# Patient Record
Sex: Female | Born: 1970 | Race: White | Hispanic: No | Marital: Single | Smoking: Never smoker
Health system: Southern US, Community
[De-identification: ages and names within clinical notes are randomized; demographics above are authoritative.]

## PROBLEM LIST (undated history)

## (undated) DIAGNOSIS — E079 Disorder of thyroid, unspecified: Secondary | ICD-10-CM

## (undated) DIAGNOSIS — E66813 Obesity, class 3: Secondary | ICD-10-CM

## (undated) DIAGNOSIS — F419 Anxiety disorder, unspecified: Secondary | ICD-10-CM

## (undated) DIAGNOSIS — T7840XA Allergy, unspecified, initial encounter: Secondary | ICD-10-CM

## (undated) DIAGNOSIS — I10 Essential (primary) hypertension: Secondary | ICD-10-CM

## (undated) DIAGNOSIS — E282 Polycystic ovarian syndrome: Secondary | ICD-10-CM

## (undated) DIAGNOSIS — K760 Fatty (change of) liver, not elsewhere classified: Secondary | ICD-10-CM

## (undated) DIAGNOSIS — F32A Depression, unspecified: Secondary | ICD-10-CM

## (undated) DIAGNOSIS — K219 Gastro-esophageal reflux disease without esophagitis: Secondary | ICD-10-CM

## (undated) DIAGNOSIS — E039 Hypothyroidism, unspecified: Secondary | ICD-10-CM

## (undated) HISTORY — DX: Polycystic ovarian syndrome: E28.2

## (undated) HISTORY — DX: Essential (primary) hypertension: I10

## (undated) HISTORY — DX: Disorder of thyroid, unspecified: E07.9

## (undated) HISTORY — DX: Allergy, unspecified, initial encounter: T78.40XA

## (undated) HISTORY — PX: DILATION AND CURETTAGE OF UTERUS: SHX78

---

## 1993-12-12 HISTORY — PX: OTHER SURGICAL HISTORY: SHX169

## 1994-12-12 HISTORY — PX: OTHER SURGICAL HISTORY: SHX169

## 2001-12-12 HISTORY — PX: CHOLECYSTECTOMY: SHX55

## 2005-09-16 ENCOUNTER — Ambulatory Visit: Payer: Self-pay | Admitting: Obstetrics and Gynecology

## 2006-06-25 ENCOUNTER — Emergency Department: Payer: Self-pay | Admitting: Emergency Medicine

## 2007-12-13 HISTORY — PX: TOTAL ABDOMINAL HYSTERECTOMY: SHX209

## 2010-11-15 ENCOUNTER — Emergency Department: Payer: Self-pay | Admitting: Emergency Medicine

## 2012-05-16 ENCOUNTER — Emergency Department: Payer: Self-pay | Admitting: Emergency Medicine

## 2015-07-08 DIAGNOSIS — K219 Gastro-esophageal reflux disease without esophagitis: Secondary | ICD-10-CM | POA: Insufficient documentation

## 2015-07-08 DIAGNOSIS — E282 Polycystic ovarian syndrome: Secondary | ICD-10-CM | POA: Insufficient documentation

## 2016-04-19 DIAGNOSIS — E669 Obesity, unspecified: Secondary | ICD-10-CM | POA: Insufficient documentation

## 2016-04-19 DIAGNOSIS — Z6841 Body Mass Index (BMI) 40.0 and over, adult: Secondary | ICD-10-CM

## 2017-05-26 ENCOUNTER — Encounter: Payer: Self-pay | Admitting: Internal Medicine

## 2017-05-31 ENCOUNTER — Ambulatory Visit (INDEPENDENT_AMBULATORY_CARE_PROVIDER_SITE_OTHER): Payer: BLUE CROSS/BLUE SHIELD | Admitting: Internal Medicine

## 2017-05-31 ENCOUNTER — Encounter: Payer: Self-pay | Admitting: Internal Medicine

## 2017-05-31 VITALS — BP 148/76 | HR 100 | Ht 63.0 in | Wt 248.0 lb

## 2017-05-31 DIAGNOSIS — E034 Atrophy of thyroid (acquired): Secondary | ICD-10-CM

## 2017-05-31 DIAGNOSIS — E282 Polycystic ovarian syndrome: Secondary | ICD-10-CM | POA: Diagnosis not present

## 2017-05-31 DIAGNOSIS — I10 Essential (primary) hypertension: Secondary | ICD-10-CM | POA: Diagnosis not present

## 2017-05-31 DIAGNOSIS — K219 Gastro-esophageal reflux disease without esophagitis: Secondary | ICD-10-CM

## 2017-05-31 DIAGNOSIS — R21 Rash and other nonspecific skin eruption: Secondary | ICD-10-CM

## 2017-05-31 DIAGNOSIS — R102 Pelvic and perineal pain: Secondary | ICD-10-CM | POA: Diagnosis not present

## 2017-05-31 DIAGNOSIS — Z1231 Encounter for screening mammogram for malignant neoplasm of breast: Secondary | ICD-10-CM

## 2017-05-31 LAB — POCT URINALYSIS DIPSTICK
Bilirubin, UA: NEGATIVE
Glucose, UA: NEGATIVE
Ketones, UA: NEGATIVE
Leukocytes, UA: NEGATIVE
NITRITE UA: NEGATIVE
PH UA: 6.5 (ref 5.0–8.0)
PROTEIN UA: NEGATIVE
RBC UA: NEGATIVE
Spec Grav, UA: <=1.005 — AB (ref 1.010–1.025)
UROBILINOGEN UA: 0.2 U/dL

## 2017-05-31 MED ORDER — LEVOTHYROXINE SODIUM 137 MCG PO CAPS: 137.0000 ug | ORAL_CAPSULE | Freq: Every day | ORAL | 5 refills | Status: DC

## 2017-05-31 NOTE — Progress Notes (Signed)
Date:  05/31/2017   Name:  Cindy Pena   DOB:  [birth year]   MRN:  [id number]   Chief Complaint: Establish Care (Was previously living in [place] and now lives in [place]. Medication will need refills soon.) and Pelvic Pain (Sharp intense pain in lower mid pelvic region. Last for about 10 seconds and then goes away. Happens about 10 times a day. Started 3 days ago. Having normal BM and voiding normally. ) Pelvic Pain  The patient's primary symptoms include pelvic pain. This is a new problem. The current episode started in the past 7 days. The problem occurs intermittently. The pain is mild. Associated symptoms include abdominal pain and rash. Pertinent negatives include no chills, constipation, diarrhea, dysuria, fever or headaches. Her past medical history is significant for ovarian cysts. (PCOS treated with metformin)  Thyroid Problem  Patient reports no anxiety, constipation, diarrhea, fatigue or palpitations. (PCOS treated with metformin)  Hypertension  This is a chronic problem. The problem is controlled. Pertinent negatives include no chest pain, headaches, palpitations, peripheral edema or shortness of breath. Past treatments include ACE inhibitors. Identifiable causes of hypertension include a thyroid problem.  Gastroesophageal Reflux  She complains of abdominal pain, dysphagia, heartburn and water brash. She reports no chest pain or no wheezing. This is a recurrent problem. The problem occurs occasionally. Pertinent negatives include no fatigue. She has tried a histamine-2 antagonist for the symptoms. Past procedures include a UGI.  Rash  This is a chronic problem. The problem is unchanged. The affected locations include the left fingers and left lower leg. The rash is characterized by redness, scaling and itchiness. She was exposed to nothing. Pertinent negatives include no diarrhea, fatigue, fever or shortness of breath. Past treatments include topical steroids. The treatment  provided no relief. (PCOS treated with metformin)      Review of Systems  Constitutional: Negative for chills, fatigue, fever and unexpected weight change.  Respiratory: Negative for chest tightness, shortness of breath and wheezing.   Cardiovascular: Negative for chest pain, palpitations and leg swelling.  Gastrointestinal: Positive for abdominal pain, dysphagia and heartburn. Negative for blood in stool, constipation and diarrhea.  Genitourinary: Positive for pelvic pain. Negative for dysuria.  Musculoskeletal: Negative for arthralgias.  Skin: Positive for rash. Negative for color change.  Neurological: Negative for dizziness and headaches.  Psychiatric/Behavioral: Negative for sleep disturbance. The patient is not nervous/anxious.     There are no active problems to display for this patient.   Prior to Admission medications   Medication Sig Start Date End Date Taking? Authorizing Provider  docusate sodium (COLACE) 100 MG capsule Take 100 mg by mouth 2 (two) times daily.   Yes [provider]  levothyroxine (SYNTHROID, LEVOTHROID) 100 MCG tablet Take 100 mcg by mouth every 3 (three) days. Three days a week.   Yes [provider]  levothyroxine (SYNTHROID, LEVOTHROID) 150 MCG tablet Take 150 mcg by mouth 4 (four) times a week.   Yes [provider]  lisinopril (PRINIVIL,ZESTRIL) 10 MG tablet Take 10 mg by mouth daily.   Yes [provider]  metFORMIN (GLUCOPHAGE) 500 MG tablet Take by mouth daily with breakfast.   Yes [provider]  Misc Natural Products (ESTROVEN + ENERGY MAX STRENGTH PO) Take by mouth daily.   Yes [provider]  ranitidine (ZANTAC) 150 MG capsule Take 150 mg by mouth 2 (two) times daily.   Yes [provider]    Allergies  Allergen Reactions  . Darvon [  Propoxyphene] Other (See Comments)    Chest Pain.  Marland Kitchen. Penicillins Other (See Comments)    Migraines.  . Tramadol Hcl Rash    Past Surgical  History:  Procedure Laterality Date  . ABDOMINAL EXPLORATION SURGERY  2009   Right ovary attached to center of stomach.  Marland Kitchen. c section  1996   September- SON Birth  . CHOLECYSTECTOMY  2003  . DILATION AND CURETTAGE OF UTERUS  1996 & 2006  . ooph Right 1995   Right tube removed- mass was on tube.    Social History  Substance Use Topics  . Smoking status: Never Smoker  . Smokeless tobacco: Never Used  . Alcohol use No     Medication list has been reviewed and updated.   Physical Exam  Constitutional: She is oriented to person, place, and time. She appears well-developed. No distress.  HENT:  Head: Normocephalic and atraumatic.  Neck: Normal range of motion. Neck supple. Carotid bruit is not present. No thyromegaly present.  Cardiovascular: Normal rate, regular rhythm and normal heart sounds.   Pulmonary/Chest: Effort normal. No respiratory distress. She has no wheezes.  Abdominal: Soft. Normal appearance and bowel sounds are normal. There is no tenderness. There is no rebound and no CVA tenderness.  Musculoskeletal: Normal range of motion.  Neurological: She is alert and oriented to person, place, and time. She has normal strength.  Skin: Skin is warm and dry. No rash noted.  Scaly red rash on left thumb Three patches of red, excoriated skin on lateral lower left leg ~ 1 cm each  Psychiatric: She has a normal mood and affect. Her speech is normal and behavior is normal. Thought content normal.  Nursing note and vitals reviewed.   BP (!) 148/76   Pulse 100   Ht 5\' 3"  (1.6 m)   Wt 248 lb (112.5 kg)   SpO2 96%   BMI 43.93 kg/m   Assessment and Plan: 1. Hypothyroidism due to acquired atrophy of thyroid Change to same daily dose - Levothyroxine Sodium 137 MCG CAPS; Take 1 capsule (137 mcg total) by mouth daily before breakfast.  Dispense: 30 capsule; Refill: 5  2. Essential hypertension Fair control - continue current medication  3. PCOS (polycystic ovarian  syndrome) On metformin Will check for pre-diabetes at [facility]  4. Gastroesophageal reflux disease, esophagitis presence not specified Continue zantac bid with good control  5. Visit for screening mammogram - MM DIGITAL SCREENING BILATERAL; Future  6. Rash Samples of eucrisa   7. Suprapubic pain May be scar tissue from previous surgeries miralax daily for one week to clear colon - POCT urinalysis dipstick   Meds ordered this encounter  Medications  . Levothyroxine Sodium 137 MCG CAPS    Sig: Take 1 capsule (137 mcg total) by mouth daily before breakfast.    Dispense:  30 capsule    Refill:  5    Bari EdwardLaura Berglund, MD [facility]  05/31/2017

## 2017-06-08 ENCOUNTER — Encounter: Payer: Self-pay | Admitting: Internal Medicine

## 2017-06-09 ENCOUNTER — Other Ambulatory Visit: Payer: Self-pay | Admitting: Internal Medicine

## 2017-06-09 MED ORDER — METFORMIN HCL 500 MG PO TABS: 500.0000 mg | ORAL_TABLET | Freq: Every day | ORAL | 5 refills | Status: DC

## 2017-06-09 MED ORDER — LISINOPRIL 10 MG PO TABS: 10.0000 mg | ORAL_TABLET | Freq: Every day | ORAL | 5 refills | Status: DC

## 2017-06-09 NOTE — Telephone Encounter (Signed)
Refill on meds. Mychart message.

## 2017-07-20 ENCOUNTER — Encounter: Payer: Self-pay | Admitting: Internal Medicine

## 2017-07-26 ENCOUNTER — Ambulatory Visit
Admission: RE | Admit: 2017-07-26 | Discharge: 2017-07-26 | Disposition: A | Discharge status: Home / Self Care | Payer: BLUE CROSS/BLUE SHIELD | Source: Ambulatory Visit | Attending: Internal Medicine | Admitting: Internal Medicine

## 2017-07-26 DIAGNOSIS — Z1231 Encounter for screening mammogram for malignant neoplasm of breast: Secondary | ICD-10-CM | POA: Diagnosis not present

## 2017-07-31 ENCOUNTER — Inpatient Hospital Stay
Admission: RE | Admit: 2017-07-31 | Discharge: 2017-07-31 | Disposition: A | Discharge status: Home / Self Care | Payer: Self-pay | Source: Ambulatory Visit | Attending: *Deleted | Admitting: *Deleted

## 2017-07-31 ENCOUNTER — Other Ambulatory Visit: Payer: Self-pay | Admitting: *Deleted

## 2017-07-31 DIAGNOSIS — Z9289 Personal history of other medical treatment: Secondary | ICD-10-CM

## 2017-10-20 ENCOUNTER — Encounter: Payer: Self-pay | Admitting: Internal Medicine

## 2017-10-20 ENCOUNTER — Ambulatory Visit (INDEPENDENT_AMBULATORY_CARE_PROVIDER_SITE_OTHER): Payer: BLUE CROSS/BLUE SHIELD | Admitting: Internal Medicine

## 2017-10-20 VITALS — BP 138/76 | HR 82 | Ht 63.0 in | Wt 251.6 lb

## 2017-10-20 DIAGNOSIS — L309 Dermatitis, unspecified: Secondary | ICD-10-CM | POA: Diagnosis not present

## 2017-10-20 DIAGNOSIS — K219 Gastro-esophageal reflux disease without esophagitis: Secondary | ICD-10-CM

## 2017-10-20 DIAGNOSIS — E039 Hypothyroidism, unspecified: Secondary | ICD-10-CM

## 2017-10-20 DIAGNOSIS — E282 Polycystic ovarian syndrome: Secondary | ICD-10-CM

## 2017-10-20 DIAGNOSIS — K5901 Slow transit constipation: Secondary | ICD-10-CM | POA: Diagnosis not present

## 2017-10-20 DIAGNOSIS — I10 Essential (primary) hypertension: Secondary | ICD-10-CM

## 2017-10-20 MED ORDER — TRIAMCINOLONE ACETONIDE 0.1 % EX CREA: 1.0000 "application " | TOPICAL_CREAM | Freq: Two times a day (BID) | CUTANEOUS | 1 refills | Status: DC

## 2017-10-20 NOTE — Progress Notes (Signed)
Date:  10/20/2017   Name:  Cindy Pena   DOB:  01/25/1971   MRN:  [id number]   Chief Complaint: Hypertension; Diabetes; and Hypothyroidism Hypertension  This is a chronic problem. The problem is controlled. Pertinent negatives include no chest pain, headaches, palpitations or shortness of breath. Past treatments include ACE inhibitors. The current treatment provides significant improvement. Identifiable causes of hypertension include a thyroid problem.  Thyroid Problem  Presents for follow-up visit. Symptoms include constipation. Patient reports no cold intolerance, depressed mood, fatigue, hair loss, hoarse voice, palpitations, weight gain or weight loss.  Gastroesophageal Reflux  She reports no abdominal pain, no chest pain, no heartburn, no hoarse voice or no sore throat. This is a recurrent problem. Pertinent negatives include no fatigue or weight loss. She has tried a histamine-2 antagonist for the symptoms. The treatment provided significant relief.  Constipation  This is a chronic problem. The problem has been waxing and waning since onset. Her stool frequency is 2 to 3 times per week. Pertinent negatives include no abdominal pain, fever or weight loss. The treatment provided moderate relief.  PCOS - on metformin daily.  No change in weight or appetite.     Review of Systems  Constitutional: Negative for chills, fatigue, fever, unexpected weight change, weight gain and weight loss.  HENT: Negative for hoarse voice and sore throat.   Respiratory: Negative for chest tightness and shortness of breath.   Cardiovascular: Negative for chest pain and palpitations.  Gastrointestinal: Positive for constipation. Negative for abdominal pain and heartburn.  Endocrine: Negative for cold intolerance.  Genitourinary: Negative for dysuria.  Neurological: Negative for dizziness and headaches.    There are no active problems to display for this patient.   Prior to Admission  medications   Medication Sig Start Date End Date Taking? Authorizing Provider  docusate sodium (COLACE) 100 MG capsule Take 100 mg by mouth 2 (two) times daily.   Yes [provider]  Levothyroxine Sodium 137 MCG CAPS Take 1 capsule (137 mcg total) by mouth daily before breakfast. 05/31/17  Yes Reubin MilanBerglund, Elva Breaker H, MD  lisinopril (PRINIVIL,ZESTRIL) 10 MG tablet Take 1 tablet (10 mg total) by mouth daily. 06/09/17  Yes Reubin MilanBerglund, Eaton Folmar H, MD  metFORMIN (GLUCOPHAGE) 500 MG tablet Take 1 tablet (500 mg total) by mouth daily with breakfast. 06/09/17  Yes Reubin MilanBerglund, Copper Basnett H, MD  Misc Natural Products (ESTROVEN + ENERGY MAX STRENGTH PO) Take by mouth daily.   Yes [provider]  ranitidine (ZANTAC) 150 MG capsule Take 150 mg by mouth 2 (two) times daily.   Yes [provider]    Allergies  Allergen Reactions  . Darvon [Propoxyphene] Other (See Comments)    Chest Pain.  Marland Kitchen. Penicillins Other (See Comments)    Migraines.  . Tramadol Hcl Rash    Past Surgical History:  Procedure Laterality Date  . ABDOMINAL EXPLORATION SURGERY  2009   Right ovary attached to center of stomach.  Marland Kitchen. c section  1996   September- SON Birth  . CHOLECYSTECTOMY  2003  . DILATION AND CURETTAGE OF UTERUS  1996 & 2006  . ooph Right 1995   Right tube removed- mass was on tube.    Social History   Tobacco Use  . Smoking status: Never Smoker  . Smokeless tobacco: Never Used  Substance Use Topics  . Alcohol use: No  . Drug use: No     Medication list has been reviewed and updated.  PHQ 2/9 Scores 10/20/2017  PHQ - 2 Score 0    Physical Exam  Constitutional: She is oriented to person, place, and time. She appears well-developed. No distress.  HENT:  Head: Normocephalic and atraumatic.  Neck: Normal range of motion. Neck supple. No thyromegaly present.  Cardiovascular: Normal rate, regular rhythm and normal heart sounds.  Pulmonary/Chest: Effort normal and breath sounds normal. No  respiratory distress. She has no wheezes.  Musculoskeletal: Normal range of motion. She exhibits no edema or tenderness.  Neurological: She is alert and oriented to person, place, and time.  Skin: Skin is warm and dry. No rash noted.  Red, eczematous rash on back of neck   Scaly, excoriated lesions on lateral left calf - 3 lesions from 0.5 to 1 cm  Psychiatric: She has a normal mood and affect. Her behavior is normal. Thought content normal.  Nursing note and vitals reviewed.   BP 138/76   Pulse 82   Ht 5\' 3"  (1.6 m)   Wt 251 lb 9.6 oz (114.1 kg)   SpO2 99%   BMI 44.57 kg/m   Assessment and Plan: 1. Essential hypertension controlled - Comprehensive metabolic panel  2. Gastroesophageal reflux disease without esophagitis Stable on PPI  3. Acquired hypothyroidism supplemented - TSH  4. PCOS (polycystic ovarian syndrome) Continue metformin - Hemoglobin A1c  5. Eczema, unspecified type Apply TAC to leg lesions bid Use Eucrisa to posterior neck - triamcinolone cream (KENALOG) 0.1 %; Apply 1 application 2 (two) times daily topically.  Dispense: 30 g; Refill: 1  6. Slow transit constipation Resume Miralax daily   Meds ordered this encounter  Medications  . triamcinolone cream (KENALOG) 0.1 %    Sig: Apply 1 application 2 (two) times daily topically.    Dispense:  30 g    Refill:  1    Partially dictated using Animal nutritionistDragon software. Any errors are unintentional.  Bari EdwardLaura Margaretann Abate, MD [facility]  10/20/2017

## 2017-10-21 ENCOUNTER — Other Ambulatory Visit: Payer: Self-pay | Admitting: Internal Medicine

## 2017-10-21 DIAGNOSIS — E034 Atrophy of thyroid (acquired): Secondary | ICD-10-CM

## 2017-10-21 LAB — COMPREHENSIVE METABOLIC PANEL
ALT: 40 IU/L — AB (ref 0–32)
AST: 28 IU/L (ref 0–40)
Albumin/Globulin Ratio: 1.6 (ref 1.2–2.2)
Albumin: 4.6 g/dL (ref 3.5–5.5)
Alkaline Phosphatase: 103 IU/L (ref 39–117)
BUN/Creatinine Ratio: 17 (ref 9–23)
BUN: 15 mg/dL (ref 6–24)
Bilirubin Total: 0.6 mg/dL (ref 0.0–1.2)
CALCIUM: 9.8 mg/dL (ref 8.7–10.2)
CO2: 26 mmol/L (ref 20–29)
CREATININE: 0.88 mg/dL (ref 0.57–1.00)
Chloride: 100 mmol/L (ref 96–106)
GFR, EST AFRICAN AMERICAN: 90 mL/min/{1.73_m2} (ref >59)
GFR, EST NON AFRICAN AMERICAN: 78 mL/min/{1.73_m2} (ref >59)
Globulin, Total: 2.8 g/dL (ref 1.5–4.5)
Glucose: 83 mg/dL (ref 65–99)
Potassium: 4.7 mmol/L (ref 3.5–5.2)
Sodium: 141 mmol/L (ref 134–144)
TOTAL PROTEIN: 7.4 g/dL (ref 6.0–8.5)

## 2017-10-21 LAB — HEMOGLOBIN A1C
Est. average glucose Bld gHb Est-mCnc: 108 mg/dL
Hgb A1c MFr Bld: 5.4 % (ref 4.8–5.6)

## 2017-10-21 LAB — TSH: TSH: 0.108 u[IU]/mL — AB (ref 0.450–4.500)

## 2017-10-21 MED ORDER — LEVOTHYROXINE SODIUM 125 MCG PO TABS: 125.0000 ug | ORAL_TABLET | Freq: Every day | ORAL | 5 refills | Status: DC

## 2017-11-06 ENCOUNTER — Encounter: Payer: Self-pay | Admitting: Internal Medicine

## 2017-11-07 NOTE — Telephone Encounter (Signed)
Patient message about medication. Please Advise.

## 2017-11-23 ENCOUNTER — Encounter: Payer: Self-pay | Admitting: Internal Medicine

## 2017-11-23 NOTE — Telephone Encounter (Signed)
Patient asking advise on neck. Looked into chart and patient has not been seen for this. Please Advise.

## 2017-12-01 ENCOUNTER — Ambulatory Visit (INDEPENDENT_AMBULATORY_CARE_PROVIDER_SITE_OTHER): Payer: BLUE CROSS/BLUE SHIELD | Admitting: Internal Medicine

## 2017-12-01 ENCOUNTER — Encounter: Payer: Self-pay | Admitting: Internal Medicine

## 2017-12-01 VITALS — BP 150/90 | HR 99 | Temp 97.7°F | Ht 63.0 in | Wt 251.0 lb

## 2017-12-01 DIAGNOSIS — R21 Rash and other nonspecific skin eruption: Secondary | ICD-10-CM | POA: Diagnosis not present

## 2017-12-01 MED ORDER — PREDNISONE 10 MG PO TABS: ORAL_TABLET | ORAL | 0 refills | Status: DC

## 2017-12-01 NOTE — Progress Notes (Signed)
Date:  12/01/2017   Name:  Cindy Pena   DOB:  07/06/1971   MRN:  [id number]   Chief Complaint: Rash (On neck. Started last visit. Started only on back of neck. Samples made worse. but neck is now bad and rash is painful and itchy. )  Rash  This is a chronic problem. The current episode started more than 1 month ago. The problem has been gradually worsening since onset. The affected locations include the neck. The rash is characterized by redness, pain and itchiness. She was exposed to nothing. Pertinent negatives include no fatigue, fever or shortness of breath. Past treatments include topical steroids.  She tried [place] for several days but it caused more discomfort.  She then tried 2 applications of TAC which also seemed to make it worse.  For the past few weeks the rash is mostly unchanged.  She is using vaseline but no other topical.   Review of Systems  Constitutional: Negative for chills, fatigue and fever.  Respiratory: Negative for chest tightness, shortness of breath and wheezing.   Cardiovascular: Negative for chest pain and palpitations.  Skin: Positive for color change and rash.  Hematological: Negative for adenopathy.    Patient Active Problem List   Diagnosis Date Noted  . Slow transit constipation 10/20/2017  . Eczema 10/20/2017  . Morbid obesity with BMI of 40.0-44.9, adult (HCC) 04/19/2016  . Acquired hypothyroidism 07/08/2015  . Gastroesophageal reflux disease without esophagitis 07/08/2015  . PCOS (polycystic ovarian syndrome) 07/08/2015    Prior to Admission medications   Medication Sig Start Date End Date Taking? Authorizing Provider  docusate sodium (COLACE) 100 MG capsule Take 100 mg by mouth 2 (two) times daily.   Yes [provider]  levothyroxine (SYNTHROID, LEVOTHROID) 125 MCG tablet Take 1 tablet (125 mcg total) daily by mouth. 10/21/17  Yes Reubin MilanBerglund, Laura H, MD  lisinopril (PRINIVIL,ZESTRIL) 10 MG tablet Take 1 tablet (10 mg  total) by mouth daily. 06/09/17  Yes Reubin MilanBerglund, Laura H, MD  metFORMIN (GLUCOPHAGE) 500 MG tablet Take 1 tablet (500 mg total) by mouth daily with breakfast. 06/09/17  Yes Reubin MilanBerglund, Laura H, MD  Misc Natural Products (ESTROVEN + ENERGY MAX STRENGTH PO) Take by mouth daily.   Yes [provider]  ranitidine (ZANTAC) 150 MG capsule Take 150 mg by mouth 2 (two) times daily.   Yes [provider]  triamcinolone cream (KENALOG) 0.1 % Apply 1 application 2 (two) times daily topically. 10/20/17  Yes Reubin MilanBerglund, Laura H, MD    Allergies  Allergen Reactions  . Darvon [Propoxyphene] Other (See Comments)    Chest Pain.  Marland Kitchen. Penicillins Other (See Comments)    Migraines.  . Tramadol Hcl Rash    Past Surgical History:  Procedure Laterality Date  . c section  1996   September- SON Birth  . CHOLECYSTECTOMY  2003  . DILATION AND CURETTAGE OF UTERUS  1996 & 2006  . ooph Right 1995   Right tube removed- mass was on tube.  Marland Kitchen. TOTAL ABDOMINAL HYSTERECTOMY  2009   Right ovary attached to center of stomach.    Social History   Tobacco Use  . Smoking status: Never Smoker  . Smokeless tobacco: Never Used  Substance Use Topics  . Alcohol use: No  . Drug use: No     Medication list has been reviewed and updated.  PHQ 2/9 Scores 10/20/2017  PHQ - 2 Score 0    Physical Exam  Constitutional: She is oriented to  person, place, and time. She appears well-developed. No distress.  HENT:  Head: Normocephalic and atraumatic.  Neck: Normal range of motion. Neck supple.  Cardiovascular: Normal rate, regular rhythm and normal heart sounds.  Pulmonary/Chest: Effort normal and breath sounds normal. No respiratory distress. She has no wheezes.  Musculoskeletal: Normal range of motion.  Neurological: She is alert and oriented to person, place, and time.  Skin: Skin is warm and dry. Rash noted.     Psychiatric: She has a normal mood and affect. Her behavior is normal. Thought content normal.    Nursing note and vitals reviewed.   BP (!) 150/90   Pulse 99   Temp 97.7 F (36.5 C) (Oral)   Ht 5\' 3"  (1.6 m)   Wt 251 lb (113.9 kg)   SpO2 99%   BMI 44.46 kg/m   Assessment and Plan: 1. Rash Try oral prednisone  Refer to Dermatology if no improvement - predniSONE (DELTASONE) 10 MG tablet; Take 6 on day 1, 5 on day 2, 4 on day 3, 3 on day 4, 2 on day 5 and 1 on day 1 then stop.  Dispense: 21 tablet; Refill: 0   Meds ordered this encounter  Medications  . predniSONE (DELTASONE) 10 MG tablet    Sig: Take 6 on day 1, 5 on day 2, 4 on day 3, 3 on day 4, 2 on day 5 and 1 on day 1 then stop.    Dispense:  21 tablet    Refill:  0    Partially dictated using Animal nutritionistDragon software. Any errors are unintentional.  Bari EdwardLaura Berglund, MD [facility]  12/01/2017

## 2017-12-08 ENCOUNTER — Other Ambulatory Visit: Payer: Self-pay | Admitting: Internal Medicine

## 2017-12-08 ENCOUNTER — Encounter: Payer: Self-pay | Admitting: Internal Medicine

## 2017-12-08 DIAGNOSIS — R21 Rash and other nonspecific skin eruption: Secondary | ICD-10-CM

## 2017-12-08 NOTE — Telephone Encounter (Signed)
Patient would like referral to dermatologist in [place] or [place].

## 2017-12-19 ENCOUNTER — Other Ambulatory Visit: Payer: Self-pay | Admitting: Internal Medicine

## 2018-01-17 ENCOUNTER — Other Ambulatory Visit: Payer: Self-pay | Admitting: Internal Medicine

## 2018-04-19 ENCOUNTER — Other Ambulatory Visit: Payer: Self-pay | Admitting: Internal Medicine

## 2018-04-19 DIAGNOSIS — E034 Atrophy of thyroid (acquired): Secondary | ICD-10-CM

## 2018-05-08 ENCOUNTER — Encounter: Payer: Self-pay | Admitting: Internal Medicine

## 2018-05-09 ENCOUNTER — Encounter: Payer: BLUE CROSS/BLUE SHIELD | Admitting: Internal Medicine

## 2018-05-20 ENCOUNTER — Other Ambulatory Visit: Payer: Self-pay | Admitting: Internal Medicine

## 2018-05-20 DIAGNOSIS — E034 Atrophy of thyroid (acquired): Secondary | ICD-10-CM

## 2018-05-21 ENCOUNTER — Ambulatory Visit (INDEPENDENT_AMBULATORY_CARE_PROVIDER_SITE_OTHER): Payer: BLUE CROSS/BLUE SHIELD | Admitting: Internal Medicine

## 2018-05-21 ENCOUNTER — Encounter: Payer: Self-pay | Admitting: Internal Medicine

## 2018-05-21 ENCOUNTER — Ambulatory Visit: Payer: BLUE CROSS/BLUE SHIELD | Admitting: Internal Medicine

## 2018-05-21 VITALS — BP 110/76 | HR 89 | Temp 98.2°F | Resp 16 | Ht 63.0 in | Wt 245.0 lb

## 2018-05-21 DIAGNOSIS — R21 Rash and other nonspecific skin eruption: Secondary | ICD-10-CM | POA: Insufficient documentation

## 2018-05-21 DIAGNOSIS — E039 Hypothyroidism, unspecified: Secondary | ICD-10-CM | POA: Diagnosis not present

## 2018-05-21 DIAGNOSIS — I10 Essential (primary) hypertension: Secondary | ICD-10-CM | POA: Diagnosis not present

## 2018-05-21 MED ORDER — BETAMETHASONE DIPROPIONATE 0.05 % EX CREA: TOPICAL_CREAM | Freq: Two times a day (BID) | CUTANEOUS | 0 refills | Status: DC

## 2018-05-21 NOTE — Progress Notes (Signed)
Date:  05/21/2018   Name:  Cindy Pena   DOB:  Dec 27, 1970   MRN:  [id number]   Chief Complaint: No chief complaint on file. Thyroid Problem  Presents for follow-up visit. Patient reports no depressed mood, diaphoresis, diarrhea, hoarse voice, leg swelling, palpitations, weight gain or weight loss. The symptoms have been stable (dose reduced slightly last visit).  Hypertension  This is a chronic problem. The problem is controlled. Pertinent negatives include no chest pain, headaches or palpitations. Past treatments include ACE inhibitors. The current treatment provides significant improvement. Identifiable causes of hypertension include a thyroid problem.  Rash  This is a chronic problem. The current episode started more than 1 year ago. The problem has been waxing and waning since onset. The affected locations include the left ankle. The rash is characterized by itchiness. Pertinent negatives include no diarrhea. Past treatments include topical steroids. The treatment provided no relief.     Review of Systems  Constitutional: Negative for diaphoresis, weight gain and weight loss.  HENT: Negative for hoarse voice, trouble swallowing and voice change.   Eyes: Negative for visual disturbance.  Respiratory: Negative for chest tightness and wheezing.   Cardiovascular: Negative for chest pain, palpitations and leg swelling.  Gastrointestinal: Negative for abdominal pain and diarrhea.  Musculoskeletal: Negative for arthralgias.  Skin: Positive for rash.  Neurological: Negative for dizziness and headaches.  Hematological: Negative for adenopathy.    Patient Active Problem List   Diagnosis Date Noted  . Essential hypertension 05/21/2018  . Slow transit constipation 10/20/2017  . Eczema 10/20/2017  . Morbid obesity with BMI of 40.0-44.9, adult (HCC) 04/19/2016  . Acquired hypothyroidism 07/08/2015  . Gastroesophageal reflux disease without esophagitis 07/08/2015  . PCOS  (polycystic ovarian syndrome) 07/08/2015    Prior to Admission medications   Medication Sig Start Date End Date Taking? Authorizing Provider  docusate sodium (COLACE) 100 MG capsule Take 100 mg by mouth 2 (two) times daily.    [provider]  levothyroxine (SYNTHROID, LEVOTHROID) 125 MCG tablet TAKE 1 TABLET(125 MCG) BY MOUTH DAILY 05/20/18   [name], MD  lisinopril (PRINIVIL,ZESTRIL) 10 MG tablet TAKE 1 TABLET(10 MG) BY MOUTH DAILY 05/20/18   [name], MD  metFORMIN (GLUCOPHAGE) 500 MG tablet TAKE 1 TABLET(500 MG) BY MOUTH DAILY WITH BREAKFAST 05/20/18   [name], MD  Misc Natural Products (ESTROVEN + ENERGY MAX STRENGTH PO) Take by mouth daily.    [provider]    12/01/17   [name], MD  ranitidine (ZANTAC) 150 MG capsule Take 150 mg by mouth 2 (two) times daily.    [provider]  triamcinolone cream (KENALOG) 0.1 % Apply 1 application 2 (two) times daily topically. 10/20/17   [name], MD    Allergies  Allergen Reactions  . Darvon [Propoxyphene] Other (See Comments)    Chest Pain.  Marland Kitchen Penicillins Other (See Comments)    Migraines.  . Tramadol Hcl Rash    Past Surgical History:  Procedure Laterality Date  . c section  1996   September- SON Birth  . CHOLECYSTECTOMY  2003  . DILATION AND CURETTAGE OF UTERUS  1996 & 2006  . ooph Right 1995   Right tube removed- mass was on tube.  Marland Kitchen TOTAL ABDOMINAL HYSTERECTOMY  2009   Right ovary attached to center of stomach.    Social History   Tobacco Use  . Smoking status: Never Smoker  . Smokeless tobacco: Never Used  Substance Use Topics  . Alcohol use: No  . Drug use: No     Medication list has been reviewed and updated.  No outpatient medications have been marked as taking for the 05/21/18 encounter (Appointment) with Reubin MilanBerglund, Japheth Diekman H, MD.    [facility] 10/20/2017  PHQ - 2 Score 0    Physical Exam  Constitutional: She is oriented to person, place,  and time. She appears well-developed. No distress.  HENT:  Head: Normocephalic and atraumatic.  Neck: Normal range of motion. Neck supple. No thyromegaly present.  Cardiovascular: Normal rate, regular rhythm and normal heart sounds.  Pulmonary/Chest: Effort normal and breath sounds normal. No respiratory distress.  Musculoskeletal: Normal range of motion.  Neurological: She is alert and oriented to person, place, and time.  Skin: Skin is warm and dry. No rash noted.     Psychiatric: She has a normal mood and affect. Her behavior is normal. Thought content normal.  Nursing note and vitals reviewed.   There were no vitals taken for this visit.  Assessment and Plan: 1. Acquired hypothyroidism supplemented - TSH  2. Essential hypertension controlled  3. Rash Increase steroid strength Refer to Dermatology - betamethasone dipropionate (DIPROLENE) 0.05 % cream; Apply topically 2 (two) times daily.  Dispense: 30 g; Refill: 0 - Ambulatory referral to Dermatology   Meds ordered this encounter  Medications  . betamethasone dipropionate (DIPROLENE) 0.05 % cream    Sig: Apply topically 2 (two) times daily.    Dispense:  30 g    Refill:  0    Partially dictated using Animal nutritionistDragon software. Any errors are unintentional.  Bari EdwardLaura Griselle Rufer, MD [facility]  05/21/2018

## 2018-05-22 LAB — TSH: TSH: 0.381 u[IU]/mL — ABNORMAL LOW (ref 0.450–4.500)

## 2018-06-13 ENCOUNTER — Encounter: Payer: Self-pay | Admitting: Internal Medicine

## 2018-06-19 ENCOUNTER — Other Ambulatory Visit: Payer: Self-pay | Admitting: Internal Medicine

## 2018-06-19 DIAGNOSIS — E034 Atrophy of thyroid (acquired): Secondary | ICD-10-CM

## 2018-11-19 ENCOUNTER — Encounter: Payer: BLUE CROSS/BLUE SHIELD | Admitting: Internal Medicine

## 2018-12-14 ENCOUNTER — Other Ambulatory Visit: Payer: Self-pay | Admitting: Internal Medicine

## 2018-12-14 DIAGNOSIS — E034 Atrophy of thyroid (acquired): Secondary | ICD-10-CM

## 2018-12-25 ENCOUNTER — Encounter: Payer: Self-pay | Admitting: Internal Medicine

## 2018-12-25 ENCOUNTER — Ambulatory Visit (INDEPENDENT_AMBULATORY_CARE_PROVIDER_SITE_OTHER): Payer: BLUE CROSS/BLUE SHIELD | Admitting: Internal Medicine

## 2018-12-25 VITALS — BP 128/78 | HR 86 | Temp 98.2°F | Ht 63.0 in | Wt 248.4 lb

## 2018-12-25 DIAGNOSIS — K529 Noninfective gastroenteritis and colitis, unspecified: Secondary | ICD-10-CM | POA: Diagnosis not present

## 2018-12-25 MED ORDER — ONDANSETRON 4 MG PO TBDP: 4.0000 mg | ORAL_TABLET | Freq: Three times a day (TID) | ORAL | 0 refills | Status: DC | PRN

## 2018-12-25 NOTE — Progress Notes (Signed)
Date:  12/25/2018   Name:  Cindy Pena   DOB:  11/15/1971   MRN:  [id number]   Chief Complaint: Abdominal Pain (Stomach pain and vomiting. Started Sunday night. Bloated stomach pain. Last BM was today and constipated. Was having mid stomach pain and now the pain is subsided but still feeling achy. No appetite. Chills and then hot flashes.   )  GI Problem  The primary symptoms include fatigue, abdominal pain, nausea and vomiting. Primary symptoms do not include fever or diarrhea. The illness began 2 days ago. The onset was sudden.  The illness is also significant for chills, anorexia and constipation.    Review of Systems  Constitutional: Positive for chills and fatigue. Negative for fever.  HENT: Negative for trouble swallowing.   Respiratory: Negative for chest tightness and shortness of breath.   Cardiovascular: Negative for chest pain and palpitations.  Gastrointestinal: Positive for abdominal pain, anorexia, constipation, nausea and vomiting. Negative for blood in stool and diarrhea.  Neurological: Negative for dizziness, light-headedness and headaches.  Hematological: Negative for adenopathy.  Psychiatric/Behavioral: Negative for sleep disturbance.    Patient Active Problem List   Diagnosis Date Noted  . Essential hypertension 05/21/2018  . Rash 05/21/2018  . Slow transit constipation 10/20/2017  . Eczema 10/20/2017  . Morbid obesity with BMI of 40.0-44.9, adult (HCC) 04/19/2016  . Acquired hypothyroidism 07/08/2015  . Gastroesophageal reflux disease without esophagitis 07/08/2015  . PCOS (polycystic ovarian syndrome) 07/08/2015    Allergies  Allergen Reactions  . Darvon [Propoxyphene] Other (See Comments)    Chest Pain.  Marland Kitchen. Penicillins Other (See Comments)    Migraines.  . Tramadol Hcl Rash  . Other Other (See Comments)    Past Surgical History:  Procedure Laterality Date  . c section  1996   September- SON Birth  . CHOLECYSTECTOMY  2003  .  DILATION AND CURETTAGE OF UTERUS  1996 & 2006  . ooph Right 1995   Right tube removed- mass was on tube.  Marland Kitchen. TOTAL ABDOMINAL HYSTERECTOMY  2009   Right ovary attached to center of stomach.    Social History   Tobacco Use  . Smoking status: Never Smoker  . Smokeless tobacco: Never Used  Substance Use Topics  . Alcohol use: No  . Drug use: No     Medication list has been reviewed and updated.  No outpatient medications have been marked as taking for the 12/25/18 encounter (Office Visit) with Reubin MilanBerglund, Floetta Brickey H, MD.    [facility] 2/9 Scores 12/25/2018 10/20/2017  PHQ - 2 Score 0 0    Physical Exam Vitals signs and nursing note reviewed.  Constitutional:      General: She is not in acute distress.    Appearance: She is well-developed.  HENT:     Head: Normocephalic and atraumatic.  Cardiovascular:     Rate and Rhythm: Normal rate and regular rhythm.     Heart sounds: Normal heart sounds.  Pulmonary:     Effort: Pulmonary effort is normal. No respiratory distress.     Breath sounds: Normal breath sounds. No wheezing.  Abdominal:     General: Bowel sounds are decreased. There is no distension or abdominal bruit.     Palpations: Abdomen is soft.     Tenderness: There is abdominal tenderness in the left upper quadrant. There is no guarding or rebound.  Musculoskeletal: Normal range of motion.  Skin:    General: Skin is warm and dry.     Findings:  No rash.  Neurological:     Mental Status: She is alert and oriented to person, place, and time.  Psychiatric:        Behavior: Behavior normal.        Thought Content: Thought content normal.     BP 128/78   Pulse 86   Temp 98.2 F (36.8 C) (Oral)   Ht 5\' 3"  (1.6 m)   Wt 248 lb 6.4 oz (112.7 kg)   SpO2 98%   BMI 44.00 kg/m   Assessment and Plan: 1. Gastroenteritis Resolving Take zofran and increase fluids Bland diet for the next 24 hours then advance as tolerated - ondansetron (ZOFRAN-ODT) 4 MG disintegrating tablet; Take  1 tablet (4 mg total) by mouth every 8 (eight) hours as needed for nausea or vomiting.  Dispense: 20 tablet; Refill: 0   Partially dictated using Animal nutritionist. Any errors are unintentional.  Bari Edward, MD [facility]  12/25/2018

## 2018-12-25 NOTE — Patient Instructions (Signed)
Continue fluids - drink constantly in sips after taking zofran  Advance diet as tolerated - stick to bland foods for the next 24 hours

## 2018-12-31 ENCOUNTER — Other Ambulatory Visit: Payer: Self-pay | Admitting: Internal Medicine

## 2018-12-31 DIAGNOSIS — Z1231 Encounter for screening mammogram for malignant neoplasm of breast: Secondary | ICD-10-CM

## 2019-01-03 ENCOUNTER — Encounter: Payer: Self-pay | Admitting: Internal Medicine

## 2019-01-03 NOTE — Telephone Encounter (Signed)
Patient requesting advise. Please advise message.

## 2019-01-09 ENCOUNTER — Other Ambulatory Visit: Payer: Self-pay | Admitting: Internal Medicine

## 2019-01-14 ENCOUNTER — Other Ambulatory Visit: Payer: Self-pay | Admitting: Internal Medicine

## 2019-01-14 DIAGNOSIS — E034 Atrophy of thyroid (acquired): Secondary | ICD-10-CM

## 2019-02-01 ENCOUNTER — Other Ambulatory Visit: Payer: Self-pay

## 2019-02-01 ENCOUNTER — Ambulatory Visit (INDEPENDENT_AMBULATORY_CARE_PROVIDER_SITE_OTHER): Payer: BLUE CROSS/BLUE SHIELD | Admitting: Internal Medicine

## 2019-02-01 ENCOUNTER — Ambulatory Visit: Payer: BLUE CROSS/BLUE SHIELD | Admitting: Internal Medicine

## 2019-02-01 ENCOUNTER — Encounter: Payer: Self-pay | Admitting: Internal Medicine

## 2019-02-01 VITALS — BP 118/78 | HR 80 | Ht 63.0 in | Wt 247.0 lb

## 2019-02-01 DIAGNOSIS — R21 Rash and other nonspecific skin eruption: Secondary | ICD-10-CM

## 2019-02-01 DIAGNOSIS — K219 Gastro-esophageal reflux disease without esophagitis: Secondary | ICD-10-CM

## 2019-02-01 DIAGNOSIS — E034 Atrophy of thyroid (acquired): Secondary | ICD-10-CM

## 2019-02-01 DIAGNOSIS — E282 Polycystic ovarian syndrome: Secondary | ICD-10-CM

## 2019-02-01 DIAGNOSIS — I10 Essential (primary) hypertension: Secondary | ICD-10-CM

## 2019-02-01 DIAGNOSIS — K5901 Slow transit constipation: Secondary | ICD-10-CM

## 2019-02-01 MED ORDER — OMEPRAZOLE 20 MG PO CPDR: 20.0000 mg | DELAYED_RELEASE_CAPSULE | Freq: Every day | ORAL | 3 refills | Status: DC

## 2019-02-01 MED ORDER — LEVOTHYROXINE SODIUM 125 MCG PO TABS: 125.0000 ug | ORAL_TABLET | Freq: Every day | ORAL | 5 refills | Status: DC

## 2019-02-01 MED ORDER — TRIAMCINOLONE ACETONIDE 0.1 % EX CREA: 1.0000 "application " | TOPICAL_CREAM | Freq: Two times a day (BID) | CUTANEOUS | 0 refills | Status: DC

## 2019-02-01 MED ORDER — METFORMIN HCL 500 MG PO TABS: 500.0000 mg | ORAL_TABLET | Freq: Every day | ORAL | 5 refills | Status: DC

## 2019-02-01 NOTE — Progress Notes (Signed)
Date:  02/01/2019   Name:  Cindy Pena   DOB:  [birth year]   MRN:  [id number]   Chief Complaint: Hypertension; Gastroesophageal Reflux (Having to take tagamet since Zantac recall and it is not helping with acid reflux. ); and Rash (Right elbow rash. Flaky and itchy. Using OTC Psoriasis cream and made the inflammation go down a lot. Started on 2 weeks now. First time having a rash like this. )  Hypertension  This is a chronic problem. The problem is controlled. Pertinent negatives include no chest pain, headaches, palpitations or shortness of breath. Past treatments include ACE inhibitors. The current treatment provides significant improvement. Identifiable causes of hypertension include a thyroid problem.  Thyroid Problem  Presents for follow-up visit. Patient reports no anxiety, fatigue, palpitations or tremors. The symptoms have been stable.  Rash  This is a new problem. The current episode started more than 1 month ago. The problem has been gradually improving since onset. The affected locations include the right elbow. The rash is characterized by dryness, itchiness and scaling. Pertinent negatives include no cough, fatigue, fever or shortness of breath. Treatments tried: otc psoriasis cream with salicylic acid.  Gastroesophageal Reflux  She complains of heartburn and water brash. She reports no abdominal pain, no chest pain or no coughing. This is a recurrent problem. The problem occurs frequently. The heartburn is located in the substernum. The heartburn wakes her from sleep. Pertinent negatives include no fatigue. She has tried a histamine-2 antagonist for the symptoms. The treatment provided mild relief.  PCOS - on metformin, A1C normal last year.  Weight stable.   Lab Results  Component Value Date   HGBA1C 5.4 10/20/2017   Lab Results  Component Value Date   CREATININE 0.88 10/20/2017   BUN 15 10/20/2017   NA 141 10/20/2017   K 4.7 10/20/2017   CL 100 10/20/2017   CO2 26 10/20/2017   Lab Results  Component Value Date   TSH 0.381 (L) 05/21/2018    Review of Systems  Constitutional: Negative for appetite change, fatigue, fever and unexpected weight change.  HENT: Negative for tinnitus and trouble swallowing.   Eyes: Negative for visual disturbance.  Respiratory: Negative for cough, chest tightness and shortness of breath.   Cardiovascular: Negative for chest pain, palpitations and leg swelling.  Gastrointestinal: Positive for heartburn. Negative for abdominal pain.  Endocrine: Negative for polydipsia and polyuria.  Genitourinary: Negative for dysuria and hematuria.  Musculoskeletal: Negative for arthralgias.  Skin: Positive for rash.  Neurological: Negative for tremors, numbness and headaches.  Psychiatric/Behavioral: Negative for dysphoric mood and sleep disturbance. The patient is not nervous/anxious.     Patient Active Problem List   Diagnosis Date Noted  . Essential hypertension 05/21/2018  . Rash 05/21/2018  . Slow transit constipation 10/20/2017  . Eczema 10/20/2017  . Morbid obesity with BMI of 40.0-44.9, adult (HCC) 04/19/2016  . Acquired hypothyroidism 07/08/2015  . Gastroesophageal reflux disease without esophagitis 07/08/2015  . PCOS (polycystic ovarian syndrome) 07/08/2015    Allergies  Allergen Reactions  . Darvon [Propoxyphene] Other (See Comments)    Chest Pain.  Marland Kitchen Penicillins Other (See Comments)    Migraines.  . Tramadol Hcl Rash  . Other Other (See Comments)    Past Surgical History:  Procedure Laterality Date  . c section  1996   September- SON Birth  . CHOLECYSTECTOMY  2003  . DILATION AND CURETTAGE OF UTERUS  1996 & 2006  . ooph Right 1995   Right  tube removed- mass was on tube.  Marland Kitchen. TOTAL ABDOMINAL HYSTERECTOMY  2009   Right ovary attached to center of stomach.    Social History   Tobacco Use  . Smoking status: Never Smoker  . Smokeless tobacco: Never Used  Substance Use Topics  . Alcohol use: No    . Drug use: No     Medication list has been reviewed and updated.  Current Meds  Medication Sig  . cimetidine (TAGAMET) 200 MG tablet Take 200 mg by mouth 2 (two) times daily.  Marland Kitchen. docusate sodium (COLACE) 100 MG capsule Take 100 mg by mouth 2 (two) times daily.  Marland Kitchen. levothyroxine (SYNTHROID, LEVOTHROID) 125 MCG tablet TAKE 1 TABLET(125 MCG) BY MOUTH DAILY  . lisinopril (PRINIVIL,ZESTRIL) 10 MG tablet TAKE 1 TABLET(10 MG) BY MOUTH DAILY  . metFORMIN (GLUCOPHAGE) 500 MG tablet TAKE 1 TABLET(500 MG) BY MOUTH DAILY WITH BREAKFAST    PHQ 2/9 Scores 02/01/2019 12/25/2018 10/20/2017  PHQ - 2 Score 1 0 0   Wt Readings from Last 3 Encounters:  02/01/19 247 lb (112 kg)  12/25/18 248 lb 6.4 oz (112.7 kg)  05/21/18 245 lb (111.1 kg)    Physical Exam Vitals signs and nursing note reviewed.  Constitutional:      General: She is not in acute distress.    Appearance: She is well-developed.  HENT:     Head: Normocephalic and atraumatic.  Eyes:     Pupils: Pupils are equal, round, and reactive to light.  Neck:     Musculoskeletal: Normal range of motion and neck supple.  Cardiovascular:     Rate and Rhythm: Normal rate and regular rhythm.     Pulses: Normal pulses.  Pulmonary:     Effort: Pulmonary effort is normal. No respiratory distress.  Abdominal:     General: Bowel sounds are normal. There is no distension.     Palpations: There is no mass.     Tenderness: There is no abdominal tenderness. There is no guarding or rebound.  Musculoskeletal: Normal range of motion.  Skin:    General: Skin is warm and dry.     Findings: No rash.          Comments: No similar rash on left elbow, knees, scalp or back  Neurological:     Mental Status: She is alert and oriented to person, place, and time.  Psychiatric:        Behavior: Behavior normal.        Thought Content: Thought content normal.     BP 118/78   Pulse 80   Ht 5\' 3"  (1.6 m)   Wt 247 lb (112 kg)   SpO2 98%   BMI 43.75 kg/m    Assessment and Plan: 1. Essential hypertension Controlled, continue current medication - CBC with Differential/Platelet - Comprehensive metabolic panel  2. Gastroesophageal reflux disease without esophagitis Stop tagament and begin omeprazole After a few months may be able to go back to Zantac - omeprazole (PRILOSEC) 20 MG capsule; Take 1 capsule (20 mg total) by mouth daily.  Dispense: 30 capsule; Refill: 3  3. PCOS (polycystic ovarian syndrome) Continue metformin Screen for pre-diabetes, lipids and renal function - metFORMIN (GLUCOPHAGE) 500 MG tablet; Take 1 tablet (500 mg total) by mouth daily with breakfast.  Dispense: 30 tablet; Refill: 5 - Lipid panel - Hemoglobin A1c  4. 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  Dispense: 30 tablet; Refill: 5 - TSH  5. Rash Suspicious for psoriasis Stop salicylic acid otc cream - triamcinolone cream (KENALOG) 0.1 %; Apply 1 application topically 2 (two) times daily. To elbow rash  Dispense: 30 g; Refill: 0   Partially dictated using Animal nutritionist. Any errors are unintentional.  Bari Edward, MD [facility]  02/01/2019

## 2019-02-02 ENCOUNTER — Encounter: Payer: Self-pay | Admitting: Internal Medicine

## 2019-02-02 DIAGNOSIS — E785 Hyperlipidemia, unspecified: Secondary | ICD-10-CM | POA: Insufficient documentation

## 2019-02-02 LAB — CBC WITH DIFFERENTIAL/PLATELET
Basophils Absolute: 0.1 10*3/uL (ref 0.0–0.2)
Basos: 1 %
EOS (ABSOLUTE): 0.2 10*3/uL (ref 0.0–0.4)
Eos: 3 %
Hematocrit: 42.4 % (ref 34.0–46.6)
Hemoglobin: 14.1 g/dL (ref 11.1–15.9)
Immature Grans (Abs): 0 10*3/uL (ref 0.0–0.1)
Immature Granulocytes: 0 %
Lymphocytes Absolute: 2.2 10*3/uL (ref 0.7–3.1)
Lymphs: 34 %
MCH: 28.1 pg (ref 26.6–33.0)
MCHC: 33.3 g/dL (ref 31.5–35.7)
MCV: 85 fL (ref 79–97)
MONOS ABS: 0.4 10*3/uL (ref 0.1–0.9)
Monocytes: 7 %
NEUTROS PCT: 55 %
Neutrophils Absolute: 3.6 10*3/uL (ref 1.4–7.0)
PLATELETS: 260 10*3/uL (ref 150–450)
RBC: 5.02 x10E6/uL (ref 3.77–5.28)
RDW: 13.4 % (ref 11.7–15.4)
WBC: 6.5 10*3/uL (ref 3.4–10.8)

## 2019-02-02 LAB — COMPREHENSIVE METABOLIC PANEL
ALT: 65 IU/L — AB (ref 0–32)
AST: 46 IU/L — ABNORMAL HIGH (ref 0–40)
Albumin/Globulin Ratio: 1.8 (ref 1.2–2.2)
Albumin: 4.5 g/dL (ref 3.8–4.8)
Alkaline Phosphatase: 101 IU/L (ref 39–117)
BUN/Creatinine Ratio: 12 (ref 9–23)
BUN: 12 mg/dL (ref 6–24)
Bilirubin Total: 0.7 mg/dL (ref 0.0–1.2)
CO2: 22 mmol/L (ref 20–29)
Calcium: 9.6 mg/dL (ref 8.7–10.2)
Chloride: 101 mmol/L (ref 96–106)
Creatinine, Ser: 1.01 mg/dL — ABNORMAL HIGH (ref 0.57–1.00)
GFR calc Af Amer: 76 mL/min/{1.73_m2} (ref >59)
GFR calc non Af Amer: 66 mL/min/{1.73_m2} (ref >59)
Globulin, Total: 2.5 g/dL (ref 1.5–4.5)
Glucose: 81 mg/dL (ref 65–99)
Potassium: 4.5 mmol/L (ref 3.5–5.2)
Sodium: 140 mmol/L (ref 134–144)
Total Protein: 7 g/dL (ref 6.0–8.5)

## 2019-02-02 LAB — HEMOGLOBIN A1C
Est. average glucose Bld gHb Est-mCnc: 105 mg/dL
Hgb A1c MFr Bld: 5.3 % (ref 4.8–5.6)

## 2019-02-02 LAB — LIPID PANEL
Chol/HDL Ratio: 4.1 ratio (ref 0.0–4.4)
Cholesterol, Total: 215 mg/dL — ABNORMAL HIGH (ref 100–199)
HDL: 52 mg/dL (ref >39)
LDL Calculated: 140 mg/dL — ABNORMAL HIGH (ref 0–99)
Triglycerides: 115 mg/dL (ref 0–149)
VLDL CHOLESTEROL CAL: 23 mg/dL (ref 5–40)

## 2019-02-02 LAB — TSH: TSH: 0.937 u[IU]/mL (ref 0.450–4.500)

## 2019-02-06 ENCOUNTER — Other Ambulatory Visit: Payer: Self-pay | Admitting: Internal Medicine

## 2019-02-06 DIAGNOSIS — R945 Abnormal results of liver function studies: Principal | ICD-10-CM

## 2019-02-06 DIAGNOSIS — R7989 Other specified abnormal findings of blood chemistry: Secondary | ICD-10-CM

## 2019-02-06 LAB — HEPATITIS PANEL, ACUTE
Hep A IgM: NEGATIVE
Hep B C IgM: NEGATIVE
Hepatitis B Surface Ag: NEGATIVE

## 2019-02-06 LAB — SPECIMEN STATUS REPORT

## 2019-02-11 ENCOUNTER — Ambulatory Visit: Payer: BLUE CROSS/BLUE SHIELD

## 2019-02-14 ENCOUNTER — Ambulatory Visit: Payer: BLUE CROSS/BLUE SHIELD

## 2019-02-20 ENCOUNTER — Ambulatory Visit
Admission: RE | Admit: 2019-02-20 | Discharge: 2019-02-20 | Disposition: A | Discharge status: Home / Self Care | Payer: BLUE CROSS/BLUE SHIELD | Source: Ambulatory Visit | Attending: Internal Medicine | Admitting: Internal Medicine

## 2019-02-20 ENCOUNTER — Encounter: Payer: Self-pay | Admitting: Internal Medicine

## 2019-02-20 ENCOUNTER — Other Ambulatory Visit: Payer: Self-pay

## 2019-02-20 DIAGNOSIS — R945 Abnormal results of liver function studies: Secondary | ICD-10-CM | POA: Insufficient documentation

## 2019-02-20 DIAGNOSIS — R7989 Other specified abnormal findings of blood chemistry: Secondary | ICD-10-CM

## 2019-02-20 DIAGNOSIS — K760 Fatty (change of) liver, not elsewhere classified: Secondary | ICD-10-CM | POA: Insufficient documentation

## 2019-04-04 ENCOUNTER — Other Ambulatory Visit: Payer: Self-pay | Admitting: Internal Medicine

## 2019-04-04 ENCOUNTER — Encounter: Payer: Self-pay | Admitting: Internal Medicine

## 2019-04-04 DIAGNOSIS — R21 Rash and other nonspecific skin eruption: Secondary | ICD-10-CM

## 2019-04-04 NOTE — Telephone Encounter (Signed)
Should we send to dermatology?

## 2019-05-09 ENCOUNTER — Other Ambulatory Visit: Payer: Self-pay | Admitting: Internal Medicine

## 2019-05-09 DIAGNOSIS — K219 Gastro-esophageal reflux disease without esophagitis: Secondary | ICD-10-CM

## 2019-06-19 ENCOUNTER — Encounter: Payer: BLUE CROSS/BLUE SHIELD | Admitting: Internal Medicine

## 2019-07-11 ENCOUNTER — Encounter: Payer: Self-pay | Admitting: Internal Medicine

## 2019-07-12 ENCOUNTER — Other Ambulatory Visit: Payer: Self-pay | Admitting: Internal Medicine

## 2019-07-12 ENCOUNTER — Other Ambulatory Visit: Payer: Self-pay

## 2019-07-12 MED ORDER — BENZONATATE 100 MG PO CAPS: 100.0000 mg | ORAL_CAPSULE | Freq: Two times a day (BID) | ORAL | 0 refills | Status: DC

## 2019-07-13 ENCOUNTER — Other Ambulatory Visit: Payer: Self-pay

## 2019-07-13 ENCOUNTER — Ambulatory Visit (INDEPENDENT_AMBULATORY_CARE_PROVIDER_SITE_OTHER): Payer: BC Managed Care – PPO

## 2019-07-13 ENCOUNTER — Encounter: Payer: Self-pay | Admitting: Emergency Medicine

## 2019-07-13 ENCOUNTER — Ambulatory Visit (INDEPENDENT_AMBULATORY_CARE_PROVIDER_SITE_OTHER)
Admission: RE | Admit: 2019-07-13 | Discharge: 2019-07-13 | Disposition: A | Discharge status: Home / Self Care | Payer: BC Managed Care – PPO | Source: Ambulatory Visit

## 2019-07-13 ENCOUNTER — Ambulatory Visit
Admission: EM | Admit: 2019-07-13 | Discharge: 2019-07-13 | Disposition: A | Discharge status: Home / Self Care | Payer: BC Managed Care – PPO | Attending: Urgent Care | Admitting: Urgent Care

## 2019-07-13 DIAGNOSIS — R05 Cough: Secondary | ICD-10-CM | POA: Diagnosis not present

## 2019-07-13 DIAGNOSIS — J029 Acute pharyngitis, unspecified: Secondary | ICD-10-CM | POA: Diagnosis not present

## 2019-07-13 DIAGNOSIS — J069 Acute upper respiratory infection, unspecified: Secondary | ICD-10-CM

## 2019-07-13 DIAGNOSIS — R5383 Other fatigue: Secondary | ICD-10-CM

## 2019-07-13 DIAGNOSIS — Z20822 Contact with and (suspected) exposure to covid-19: Secondary | ICD-10-CM

## 2019-07-13 DIAGNOSIS — R059 Cough, unspecified: Secondary | ICD-10-CM

## 2019-07-13 DIAGNOSIS — R5381 Other malaise: Secondary | ICD-10-CM | POA: Diagnosis not present

## 2019-07-13 DIAGNOSIS — H66002 Acute suppurative otitis media without spontaneous rupture of ear drum, left ear: Secondary | ICD-10-CM

## 2019-07-13 DIAGNOSIS — M791 Myalgia, unspecified site: Secondary | ICD-10-CM

## 2019-07-13 DIAGNOSIS — Z20828 Contact with and (suspected) exposure to other viral communicable diseases: Secondary | ICD-10-CM

## 2019-07-13 DIAGNOSIS — R053 Chronic cough: Secondary | ICD-10-CM

## 2019-07-13 DIAGNOSIS — R0789 Other chest pain: Secondary | ICD-10-CM | POA: Diagnosis not present

## 2019-07-13 DIAGNOSIS — R0602 Shortness of breath: Secondary | ICD-10-CM

## 2019-07-13 MED ORDER — AZITHROMYCIN 250 MG PO TABS: 250.0000 mg | ORAL_TABLET | Freq: Every day | ORAL | 0 refills | Status: DC

## 2019-07-13 MED ORDER — HYDROCOD POLST-CPM POLST ER 10-8 MG/5ML PO SUER: 5.0000 mL | Freq: Three times a day (TID) | ORAL | 0 refills | Status: DC | PRN

## 2019-07-13 NOTE — Discharge Instructions (Signed)
We will manage this as a viral syndrome. For sore throat or cough try using a honey-based tea. Use 3 teaspoons of honey with juice squeezed from half lemon. Place shaved pieces of ginger into 1/2-1 cup of water and warm over stove top. Then mix the ingredients and repeat every 4 hours as needed. Please take Tylenol 500mg  every 6 hours. Hydrate very well with at least 2 liters of water. Eat light meals such as soups to replenish electrolytes and soft fruits, veggies. Start an antihistamine like Zyrtec, Allegra or Claritin for postnasal drainage, sinus congestion.  You can take this together with pseudoephedrine (Sudafed) at a dose of 60 mg 3 times a day daily as needed for the same kind of congestion.  However, do not take Sudafed if you have high blood pressure or are prone to palpitations, have abnormal heart rhythms.

## 2019-07-13 NOTE — ED Triage Notes (Signed)
Pt c/o cough, sore throat, chest congestion, chest pain when she coughs. Started about 4  Days. Sore throat resolved when the cough and congestion started. Denies fever, shortness of breath

## 2019-07-13 NOTE — ED Provider Notes (Signed)
Virtual Visit via Video Note:  Cindy Pena  initiated request for Telemedicine visit with [facility] team. I connected with Cindy Pena  on 07/13/2019 at 10:35 AM  for a synchronized telemedicine visit using a video enabled HIPPA compliant telemedicine application. I verified that I am speaking with Cindy Pena  using two identifiers. Jaynee Eagles, PA-C  was physically located in a [facility] Urgent care site and Cindy Pena was located at a different location.   The limitations of evaluation and management by telemedicine as well as the availability of in-person appointments were discussed. Patient was informed that she  may incur a bill ( including co-pay) for this virtual visit encounter. Ariela L Martire  expressed understanding and gave verbal consent to proceed with virtual visit.     History of Present Illness:Cindy Pena  is a 48 y.o. female presents with 5 day history of severe throat pain, progressed into head congestion, mostly dry cough that is worsening.  Patient states that she was prescribed Tessalon perles by her PCP yesterday, has not gotten any relief. No direct contact with COVID 19 patients. Denies history of asthma, COPD. Denies smoking cigarettes.    Review of Systems  Constitutional: Positive for malaise/fatigue. Negative for fever.  HENT: Positive for congestion and sore throat. Negative for ear pain and sinus pain.        Hoarseness.  Eyes: Negative for blurred vision, double vision, discharge and redness.  Respiratory: Positive for cough. Negative for hemoptysis, shortness of breath and wheezing.   Cardiovascular: Positive for chest pain (with her cough).  Gastrointestinal: Negative for abdominal pain, diarrhea, nausea and vomiting.  Genitourinary: Negative for dysuria, flank pain and hematuria.  Musculoskeletal: Negative for myalgias.  Skin: Negative for rash.  Neurological: Positive  for headaches. Negative for dizziness and weakness.  Psychiatric/Behavioral: Negative for depression and substance abuse. The patient has insomnia (from coughing and congestion).     No current facility-administered medications for this encounter.    Current Outpatient Medications  Medication Sig Dispense Refill  . benzonatate (TESSALON) 100 MG capsule Take 1 capsule (100 mg total) by mouth 2 (two) times daily. 20 capsule 0  . cimetidine (TAGAMET) 200 MG tablet Take 200 mg by mouth 2 (two) times daily.    Marland Kitchen docusate sodium (COLACE) 100 MG capsule Take 100 mg by mouth 2 (two) times daily.    Marland Kitchen levothyroxine (SYNTHROID, LEVOTHROID) 125 MCG tablet Take 1 tablet (125 mcg total) by mouth daily before breakfast. 30 tablet 5  . lisinopril (ZESTRIL) 10 MG tablet TAKE 1 TABLET(10 MG) BY MOUTH DAILY 30 tablet 5  . metFORMIN (GLUCOPHAGE) 500 MG tablet Take 1 tablet (500 mg total) by mouth daily with breakfast. 30 tablet 5  . Misc Natural Products (ESTROVEN + ENERGY MAX STRENGTH PO) Take by mouth daily.    Marland Kitchen omeprazole (PRILOSEC) 20 MG capsule TAKE 1 CAPSULE(20 MG) BY MOUTH DAILY 30 capsule 3  . triamcinolone cream (KENALOG) 0.1 % Apply 1 application topically 2 (two) times daily. To elbow rash 30 g 0     Allergies  Allergen Reactions  . Darvon [Propoxyphene] Other (See Comments)    Chest Pain.  Marland Kitchen Penicillins Other (See Comments)    Migraines.  . Tramadol Hcl Rash  . Other Other (See Comments)     Past Medical History:  Diagnosis Date  . Hypertension   . PCOS (polycystic ovarian syndrome)   . Thyroid disease     Past  Surgical History:  Procedure Laterality Date  . c section  1996   September- SON Birth  . CHOLECYSTECTOMY  2003  . DILATION AND CURETTAGE OF UTERUS  1996 & 2006  . ooph Right 1995   Right tube removed- mass was on tube.  Marland Kitchen. TOTAL ABDOMINAL HYSTERECTOMY  2009   Right ovary attached to center of stomach.    Observations/Objective: Physical Exam Constitutional:       General: She is not in acute distress.    Appearance: She is well-developed. She is ill-appearing. She is not toxic-appearing or diaphoretic.  Eyes:     Extraocular Movements: Extraocular movements intact.  Pulmonary:     Effort: Pulmonary effort is normal.  Neurological:     Mental Status: She is alert and oriented to person, place, and time.  Psychiatric:        Mood and Affect: Mood normal.        Behavior: Behavior normal.        Thought Content: Thought content normal.        Judgment: Judgment normal.      BP Readings from Last 3 Encounters:  02/01/19 118/78  12/25/18 128/78  05/21/18 110/76    Assessment and Plan:  1. Persistent dry cough   2. Atypical chest pain   3. Malaise and fatigue    Offered patient symptomatic relief but emphasized need for in person evaluation at [facility] in [place] for differential that includes COVID 19, CAP, URI, sinus infection to name a few. Patient was agreeable to this, will report to UC in [place].    Follow Up Instructions:    I discussed the assessment and treatment plan with the patient. The patient was provided an opportunity to ask questions and all were answered. The patient agreed with the plan and demonstrated an understanding of the instructions.   The patient was advised to call back or seek an in-person evaluation if the symptoms worsen or if the condition fails to improve as anticipated.  I provided 15 minutes of non-face-to-face time during this encounter.    Wallis BambergMario Hays Dunnigan, PA-C  07/13/2019 10:35 AM         Wallis BambergMani, Azam Gervasi, PA-C 07/13/19 [id number]

## 2019-07-13 NOTE — Discharge Instructions (Signed)
It was very nice seeing you today in clinic. Thank you for entrusting me with your care.   Please utilize the medications that we discussed. Your prescriptions have been called in to your pharmacy. Increase fluid intake as much as possible. Water is always best, as sugar and caffeine containing fluids can cause you to become dehydrated. Try to incorporate electrolyte enriched fluids, such as Gatorade or Pedialyte, into your daily fluid intake. May use Tylenol and/or Ibuprofen as needed for pain/fever.   You were swabbed for COVID today. Results can take as long as a week to come back. Self quarantine at home , per [facility] guidelines, until negative results have been received.  Make arrangements to follow up with your regular doctor in 1 week for re-evaluation if not improving. If your symptoms/condition worsens, please seek follow up care either here or in the ER. Please remember, our [facility] providers are "right here with you" when you need [place].   Again, it was my pleasure to take care of you today. Thank you for choosing our clinic. I hope that you start to feel better quickly.   Honor Loh, MSN, APRN, FNP-C, CEN Advanced Practice Provider [facility]

## 2019-07-14 LAB — NOVEL CORONAVIRUS, NAA (HOSP ORDER, SEND-OUT TO REF LAB; TAT 18-24 HRS): SARS-CoV-2, NAA: NOT DETECTED

## 2019-07-14 NOTE — ED Provider Notes (Signed)
[place], [place]   Name: Cindy Pena DOB: 10/26/1971 MRN: [id number] CSN: [id number] PCP: Reubin MilanBerglund, Laura H, MD  Arrival date and time:  07/13/19 1131  Chief Complaint:  Sore Throat and Cough   NOTE: Prior to seeing the patient today, I have reviewed the triage nursing documentation and vital signs. Clinical staff has updated patient's PMH/PSHx, current medication list, and drug allergies/intolerances to ensure comprehensive history available to assist in medical decision making.   History:   HPI: Cindy Pena is a 48 y.o. female who presents today with complaints of complains of upper respiratory symptoms that began with acute onset on Monday (07/08/2019). Acute illness started off with a severe sore throat when she woke up. She treated conservatively with APAP, warm salt water gargles, and lozenges which improved her symptoms. Sore throat abated without further treatment or recurrence the following day.   On Tuesday (07/09/2019), patient notes that she began to feel "crappy". She developed a deep dry cough, nasal congestion, rhinorrhea, excessive sneezing, myalgias, and fever (Tmax unknown). She continued conservative management at home, however symptoms continued to worsen. On Thursday (07/11/2019), patient advises that she lost her ability to smell and taste anything, which caused her to be anxious in the wake of current SARS-CoV-2 (novel coronavirus) pandemic. Cough persisted and caused her to be unable to sleep. She added guaifenesin syrup to her self management efforts, however she advises that it was not effective either. Patient contacted PCP Otho Darner(Burglund, MD) yesterday (06/14/2019), at which time she was prescribed benzonatate. She has been taking the prescribed antitussive medication, however she has not perceived any benefit from its use.   Patient presents today after having had a tele-health visit earlier this morning. Notes reviewed. Patient acutely ill appearing.  She complains of a "horrible deep cough" and associated pleuritic chest pain. Cough has caused her to experience episodes of post-tussive vomiting. She states, "I just keep getting worse. I feel so bad. This is the worst that I have felt in a long time". Patient is employed by [employer], a [employer] [place] where several co-workers have tested positive for SARS-CoV-2 (novel coronavirus) recently. Patient denies close direct contact with any of the individuals known to have tested positive.   Past Medical History:  Diagnosis Date  . Hypertension   . PCOS (polycystic ovarian syndrome)   . Thyroid disease     Past Surgical History:  Procedure Laterality Date  . c section  1996   September- SON Birth  . CHOLECYSTECTOMY  2003  . DILATION AND CURETTAGE OF UTERUS  1996 & 2006  . ooph Right 1995   Right tube removed- mass was on tube.  Marland Kitchen. TOTAL ABDOMINAL HYSTERECTOMY  2009   Right ovary attached to center of stomach.    Family History  Problem Relation Age of Onset  . Hypertension Father   . Prostate cancer Father   . Hypertension Sister   . Cancer Paternal Aunt   . Breast cancer Paternal Aunt     Social History   Tobacco Use  . Smoking status: Never Smoker  . Smokeless tobacco: Never Used  Substance Use Topics  . Alcohol use: No  . Drug use: No    Patient Active Problem List   Diagnosis Date Noted  . Hepatic steatosis 02/20/2019  . Mild hyperlipidemia 02/02/2019  . Hypothyroidism due to acquired atrophy of thyroid 02/01/2019  . Essential hypertension 05/21/2018  . Rash 05/21/2018  . Slow transit constipation 10/20/2017  . Eczema 10/20/2017  .  Morbid obesity with BMI of 40.0-44.9, adult (HCC) 04/19/2016  . Gastroesophageal reflux disease without esophagitis 07/08/2015  . PCOS (polycystic ovarian syndrome) 07/08/2015    Home Medications:    Current Meds  Medication Sig  . benzonatate (TESSALON) 100 MG capsule Take 1 capsule (100 mg total) by mouth 2 (two) times  daily.  Marland Kitchen. docusate sodium (COLACE) 100 MG capsule Take 100 mg by mouth 2 (two) times daily.  Marland Kitchen. levothyroxine (SYNTHROID, LEVOTHROID) 125 MCG tablet Take 1 tablet (125 mcg total) by mouth daily before breakfast.  . lisinopril (ZESTRIL) 10 MG tablet TAKE 1 TABLET(10 MG) BY MOUTH DAILY  . metFORMIN (GLUCOPHAGE) 500 MG tablet Take 1 tablet (500 mg total) by mouth daily with breakfast.  . Misc Natural Products (ESTROVEN + ENERGY MAX STRENGTH PO) Take by mouth daily.  Marland Kitchen. omeprazole (PRILOSEC) 20 MG capsule TAKE 1 CAPSULE(20 MG) BY MOUTH DAILY    Allergies:   Darvon [propoxyphene], Penicillins, Tramadol hcl, and Other  Review of Systems (ROS): Review of Systems  Constitutional: Positive for activity change (decreased), chills, fatigue and fever (Tmax unknown).  HENT: Positive for congestion, rhinorrhea, sneezing and voice change (hoarse). Negative for sore throat (resolved), tinnitus and trouble swallowing.        (+) dysosmia and dysgeusia   Respiratory: Positive for cough (dry), chest tightness and shortness of breath.   Cardiovascular: Positive for chest pain (pleuritic). Negative for palpitations.  Gastrointestinal: Positive for vomiting (post-tussive). Negative for abdominal pain, diarrhea and nausea.  Musculoskeletal: Positive for myalgias. Negative for arthralgias, back pain, gait problem, joint swelling, neck pain and neck stiffness.  Skin: Negative for color change, pallor and rash.  Neurological: Positive for weakness (generalized) and headaches. Negative for dizziness, syncope and light-headedness.  Hematological: Negative for adenopathy.  Psychiatric/Behavioral: Positive for sleep disturbance.  All other systems reviewed and are negative.    Vital Signs: Today's Vitals   07/13/19 1143 07/13/19 1150 07/13/19 1255  BP:  (!) 144/80   Pulse:  (!) 104   Resp:  20   Temp:  99.1 F (37.3 C)   TempSrc:  Oral   SpO2:  99%   Weight: 265 lb (120.2 kg)    Height: 5\' 3"  (1.6 m)     PainSc: 0-No pain  0-No pain    Physical Exam: Physical Exam  Constitutional: She is oriented to person, place, and time and well-developed, well-nourished, and in no distress. She appears to not be writhing in pain and not dehydrated.  Non-toxic appearance. She has a sickly appearance (acutely ill appearing). No distress.  HENT:  Head: Normocephalic and atraumatic.  Right Ear: Hearing, tympanic membrane, external ear and ear canal normal.  Left Ear: Hearing, external ear and ear canal normal. No tenderness. Tympanic membrane is erythematous. Tympanic membrane is not bulging. A middle ear effusion is present.  Nose: Mucosal edema and rhinorrhea present. No sinus tenderness.  Mouth/Throat: Uvula is midline and mucous membranes are normal. No oropharyngeal exudate, posterior oropharyngeal edema or posterior oropharyngeal erythema.  Neck:  Hoarse voice quality  Cardiovascular: Regular rhythm, normal heart sounds and intact distal pulses. Tachycardia present. Exam reveals no gallop and no friction rub.  No murmur heard. Pulmonary/Chest: Effort normal. No accessory muscle usage. No respiratory distress. She has decreased breath sounds in the right lower field and the left lower field. She has no wheezes. She has no rhonchi. She has no rales. She exhibits tenderness. She exhibits no crepitus and no retraction.  Abdominal: Soft. Normal appearance and bowel  sounds are normal. There is no hepatosplenomegaly. There is no abdominal tenderness. There is no CVA tenderness.  Lymphadenopathy:       Head (right side): Submandibular adenopathy present.       Head (left side): Submandibular adenopathy present.  Neurological: She is alert and oriented to person, place, and time. Gait normal. GCS score is 15.  Skin: Skin is warm, dry and intact. No rash noted. She is not diaphoretic.  Psychiatric: Mood, memory, affect and judgment normal.  Nursing note and vitals reviewed.   Urgent Care Treatments / Results:    LABS: PLEASE NOTE: all labs that were ordered this encounter are listed, however only abnormal results are displayed. Labs Reviewed  NOVEL CORONAVIRUS, NAA (HOSPITAL ORDER, SEND-OUT TO REF LAB)    EKG: -None  RADIOLOGY: Dg Chest 2 View  Result Date: 07/13/2019 CLINICAL DATA:  Cough EXAM: CHEST - 2 VIEW COMPARISON:  None. FINDINGS: Cardiac silhouette is normal in size. Normal mediastinal and hilar contours. Clear symmetrically aerated lungs. No pleural effusion or pneumothorax. Skeletal structures are intact. IMPRESSION: No active cardiopulmonary disease. Electronically Signed   By: Lajean Manes M.D.   On: 07/13/2019 12:31    PROCEDURES: Procedures  MEDICATIONS RECEIVED THIS VISIT: Medications - No data to display  PERTINENT CLINICAL COURSE NOTES/UPDATES:   Initial Impression / Assessment and Plan / Urgent Care Course:  Pertinent labs & imaging results that were available during my care of the patient were personally reviewed by me and considered in my medical decision making (see lab/imaging section of note for values and interpretations).  ALYANNA STOERMER is a 48 y.o. female who [facility] to [facility] today with complaints of Sore Throat and Cough   Patient is ill appearing (non-toxic) overall in clinic today. She does not appear to be in any acute distress. Presenting symptoms (see HPI) and exam as documented above. Radiographs of the chest performed today in clinic revealed no acute cardiopulmonary process; no evidence of peribronchial thickening, areas of consolidation, or focal infiltrates. Discussed potential for SARS-CoV-2 (novel coronavirus) infection given her current symptoms. Reviewed potential for infection given potential for exposure at work. In light of her presenting symptom constellation, coupled with her potential for exposure, testing is reasonable. Patient collected SARS-CoV-2 via facility approved self swab process today under the supervision of  certified clinical staff. Discussed variable turn around times associated with testing, as swabs are being processed at [facility], and have been taking as long as 7 days. She was advised to self quarantine, per [facility] DHHS guidelines, until negative results received.   Her presenting symptoms and physical exam today are consistent with an upper respiratory infection with concurrent AOM on the LEFT. Her symptoms have significantly progreseds despite conservative management at home. She appears to be feeling miserable. Will cover with course of oral azithromycin. She was advised to rest and increase fluid intake as much as possible (water and ORS). May use Tylenol and/or Ibuprofen as needed for pain/fever. Will provide a short course of Tussionex to help with cough and pleuritic chest pain. Educated on side effects; may not drive while using.   Current clinical condition warrants patient being out of work in order to recover from her current injury/illness. She was provided with the appropriate documentation to provide to her place of employment that will allow for her to RTW on 07/16/2019 with no restrictions if she has received negative SARS-CoV-2 (novel coronavirus) test results.   Discussed follow up with primary care physician in 1  week for re-evaluation. I have reviewed the follow up and strict return precautions for any new or worsening symptoms. Patient is aware of symptoms that would be deemed urgent/emergent, and would thus require further evaluation either here or in the emergency department. At the time of discharge, she verbalized understanding and consent with the discharge plan as it was reviewed with her. All questions were fielded by provider and/or clinic staff prior to patient discharge.    ADDENDUM: Pharmacy contacted clinic to advise that patient's insurance will only cover BID dosing of the prescribed Tussionex. Verbal order provided to reduce to BID and to adjust dispensed volume to quantity  sufficient.   Final Clinical Impressions / Urgent Care Diagnoses:   Final diagnoses:  Exposure to Covid-19 Virus  URI with cough and congestion  Non-recurrent acute suppurative otitis media of left ear without spontaneous rupture of tympanic membrane    New Prescriptions:  [place] Controlled Substance Registry consulted? Yes, I have consulted the [place] Controlled Substances Registry for this patient, and feel the risk/benefit ratio today is favorable for proceeding with this prescription for a controlled substance.  . Discussed use of controlled substance medication to treat her acute cough and pleuritic chest pain.  o Reviewed  STOP Act regulations  o Clinic does not refill controlled substances over the phone without face to face evaluation.  . Safety precautions reviewed.  o Medications should not be bitten, chewed, crushed, shared, or taken with alcohol.  o Avoid use while working, driving, or operating heavy machinery.  o Side effects associated with the use of this particular medication reviewed. - Patient understands that this medication can cause CNS depression, increase her risk of falls, and even lead to overdose that may result in death, if used outside of the parameters that she and I discussed.  With all of this in mind, she knowingly accepts the risks and responsibilities associated with intended course of treatment, and elects to responsibly proceed as discussed.  Meds ordered this encounter  Medications  . azithromycin (ZITHROMAX) 250 MG tablet    Sig: Take 1 tablet (250 mg total) by mouth daily. Take first 2 tablets together, then 1 every day until finished.    Dispense:  6 tablet    Refill:  0  . chlorpheniramine-HYDROcodone (TUSSIONEX PENNKINETIC ER) 10-8 MG/5ML SUER    Sig: Take 5 mLs by mouth 3 (three) times daily as needed for cough.    Dispense:  75 mL    Refill:  0    Recommended Follow up Care:  Patient encouraged to follow up with the following provider within  the specified time frame, or sooner as dictated by the severity of her symptoms. As always, she was instructed that for any urgent/emergent care needs, she should seek care either here or in the emergency department for more immediate evaluation.  Follow-up Information    Reubin MilanBerglund, Laura H, MD In 1 week.   Specialty: Internal Medicine Why: General reassessment of symptoms if not improving Contact information: [address] [place] [address] [phone]         NOTE: This note was prepared using Dragon dictation software along with smaller phrase technology. Despite my best ability to proofread, there is the potential that transcriptional errors may still occur from this process, and are completely unintentional.     Verlee MonteGray, Anhelica Fowers E, NP 07/14/19 1904

## 2019-07-15 ENCOUNTER — Encounter: Payer: Self-pay | Admitting: Internal Medicine

## 2019-08-10 ENCOUNTER — Other Ambulatory Visit: Payer: Self-pay | Admitting: Internal Medicine

## 2019-08-10 DIAGNOSIS — E034 Atrophy of thyroid (acquired): Secondary | ICD-10-CM

## 2019-08-10 DIAGNOSIS — E282 Polycystic ovarian syndrome: Secondary | ICD-10-CM

## 2019-09-19 ENCOUNTER — Other Ambulatory Visit: Payer: Self-pay | Admitting: Internal Medicine

## 2019-09-19 DIAGNOSIS — K219 Gastro-esophageal reflux disease without esophagitis: Secondary | ICD-10-CM

## 2019-12-09 ENCOUNTER — Other Ambulatory Visit: Payer: Self-pay | Admitting: Internal Medicine

## 2019-12-09 DIAGNOSIS — Z1231 Encounter for screening mammogram for malignant neoplasm of breast: Secondary | ICD-10-CM

## 2019-12-16 ENCOUNTER — Encounter: Payer: Self-pay | Admitting: Internal Medicine

## 2019-12-17 ENCOUNTER — Other Ambulatory Visit: Payer: Self-pay | Admitting: Internal Medicine

## 2019-12-17 DIAGNOSIS — K6289 Other specified diseases of anus and rectum: Secondary | ICD-10-CM

## 2019-12-17 MED ORDER — HYDROCORTISONE ACETATE 25 MG RE SUPP: 25.0000 mg | Freq: Two times a day (BID) | RECTAL | 0 refills | Status: DC

## 2019-12-17 NOTE — Telephone Encounter (Signed)
Is there something else patient can use to help with rectum pain from so much diarrhea?

## 2019-12-17 NOTE — Telephone Encounter (Signed)
Please advise 

## 2019-12-24 ENCOUNTER — Encounter: Payer: Self-pay | Admitting: Internal Medicine

## 2019-12-25 ENCOUNTER — Other Ambulatory Visit: Payer: Self-pay

## 2019-12-25 ENCOUNTER — Telehealth: Payer: BC Managed Care – PPO | Admitting: Nurse Practitioner

## 2019-12-25 DIAGNOSIS — R197 Diarrhea, unspecified: Secondary | ICD-10-CM

## 2019-12-25 DIAGNOSIS — K648 Other hemorrhoids: Secondary | ICD-10-CM

## 2019-12-25 DIAGNOSIS — K6289 Other specified diseases of anus and rectum: Secondary | ICD-10-CM

## 2019-12-25 MED ORDER — HYDROCORTISONE ACETATE 25 MG RE SUPP: 25.0000 mg | Freq: Two times a day (BID) | RECTAL | 0 refills | Status: DC

## 2019-12-25 NOTE — Telephone Encounter (Signed)
Patient would like referral to GI.

## 2019-12-25 NOTE — Telephone Encounter (Signed)
Should we refer her to GI?

## 2019-12-25 NOTE — Progress Notes (Signed)
Contacted patient phone to clarify the questionnaire. SHe has had diarrhea since last week and went to the ED and they could not find reason for diarrhea. She was not prescribed anything. She is still having diarrhea with some blood and has cramping. Cramps feel like she is on her period. Diarrhea is now 2-4 x a day but was greater then 8x a day. Her main issue is the painful internal hemorrhoids. She is using anucort supossitories but has ran out.    I will refill anucort suppositories.  Based on what you shared with me it looks like you have diarrhea,that should be evaluated in a face to face office visit. You need to have test ran on stool specimen    NOTE: If you entered your credit card information for this eVisit, you will not be charged. You may see a "hold" on your card for the $35 but that hold will drop off and you will not have a charge processed.  If you are having a true medical emergency please call 911.     For an urgent face to face visit, [facility] has four urgent care centers for your convenience:   . [facility]    [phone]                  Get Driving Directions  [address] [place], [place] [address] . 10 am to 8 pm Monday-Friday . 12 pm to 8 pm Saturday-Sunday   . [facility] at [facility]  [phone]                  Get Driving Directions  [address] [place], [place] [address] . 8 am to 8 pm Monday-Friday . 9 am to 6 pm Saturday . 11 am to 6 pm Sunday   . [facility] at [facility]  [phone]                  Get Driving Directions   [address] [place], [place] [address] . 8 am to 8 pm Monday-Friday . 8 am to 4 pm Saturday-Sunday    . [facility] at [facility] Directions  [phone]  [address], [address] [place], [place] [address]  . Monday-Friday, 12 PM to 6 PM    Your e-visit answers were  reviewed by a board certified advanced clinical practitioner to complete your personal care plan.  Thank you for using e-Visits.

## 2019-12-26 ENCOUNTER — Encounter: Payer: Self-pay | Admitting: Emergency Medicine

## 2019-12-26 ENCOUNTER — Emergency Department
Admission: EM | Admit: 2019-12-26 | Discharge: 2019-12-26 | Disposition: A | Discharge status: Home / Self Care | Payer: BC Managed Care – PPO | Attending: Emergency Medicine | Admitting: Emergency Medicine

## 2019-12-26 ENCOUNTER — Other Ambulatory Visit: Payer: Self-pay

## 2019-12-26 DIAGNOSIS — R197 Diarrhea, unspecified: Secondary | ICD-10-CM

## 2019-12-26 DIAGNOSIS — I10 Essential (primary) hypertension: Secondary | ICD-10-CM | POA: Diagnosis not present

## 2019-12-26 DIAGNOSIS — E282 Polycystic ovarian syndrome: Secondary | ICD-10-CM | POA: Insufficient documentation

## 2019-12-26 DIAGNOSIS — Z79899 Other long term (current) drug therapy: Secondary | ICD-10-CM | POA: Diagnosis not present

## 2019-12-26 DIAGNOSIS — R110 Nausea: Secondary | ICD-10-CM | POA: Insufficient documentation

## 2019-12-26 LAB — CBC
HCT: 44.2 % (ref 36.0–46.0)
Hemoglobin: 14.6 g/dL (ref 12.0–15.0)
MCH: 27.2 pg (ref 26.0–34.0)
MCHC: 33 g/dL (ref 30.0–36.0)
MCV: 82.3 fL (ref 80.0–100.0)
Platelets: 155 10*3/uL (ref 150–400)
RBC: 5.37 MIL/uL — ABNORMAL HIGH (ref 3.87–5.11)
RDW: 13.4 % (ref 11.5–15.5)
WBC: 7 10*3/uL (ref 4.0–10.5)
nRBC: 0 % (ref 0.0–0.2)

## 2019-12-26 LAB — COMPREHENSIVE METABOLIC PANEL
ALT: 64 U/L — ABNORMAL HIGH (ref 0–44)
AST: 53 U/L — ABNORMAL HIGH (ref 15–41)
Albumin: 4 g/dL (ref 3.5–5.0)
Alkaline Phosphatase: 94 U/L (ref 38–126)
Anion gap: 12 (ref 5–15)
BUN: 16 mg/dL (ref 6–20)
CO2: 21 mmol/L — ABNORMAL LOW (ref 22–32)
Calcium: 9 mg/dL (ref 8.9–10.3)
Chloride: 101 mmol/L (ref 98–111)
Creatinine, Ser: 1.09 mg/dL — ABNORMAL HIGH (ref 0.44–1.00)
GFR calc Af Amer: >60 mL/min (ref >60)
GFR calc non Af Amer: 60 mL/min — ABNORMAL LOW (ref >60)
Glucose, Bld: 152 mg/dL — ABNORMAL HIGH (ref 70–99)
Potassium: 4.1 mmol/L (ref 3.5–5.1)
Sodium: 134 mmol/L — ABNORMAL LOW (ref 135–145)
Total Bilirubin: 1.9 mg/dL — ABNORMAL HIGH (ref 0.3–1.2)
Total Protein: 7.5 g/dL (ref 6.5–8.1)

## 2019-12-26 LAB — LIPASE, BLOOD: Lipase: 41 U/L (ref 11–51)

## 2019-12-26 MED ORDER — SODIUM CHLORIDE 0.9 % IV BOLUS: 1000.0000 mL | Freq: Once | INTRAVENOUS | Status: DC

## 2019-12-26 MED ORDER — ONDANSETRON HCL 4 MG/2ML IJ SOLN: 4.0000 mg | Freq: Once | INTRAMUSCULAR | Status: DC

## 2019-12-26 MED ORDER — ONDANSETRON 4 MG PO TBDP: 4.0000 mg | ORAL_TABLET | Freq: Three times a day (TID) | ORAL | 0 refills | Status: DC | PRN

## 2019-12-26 NOTE — Discharge Instructions (Addendum)
You should follow-up with your primary care doctor to give a stool sample.  Continue drink plenty of fluids.  The Zofran to help with nausea.  You can take Tylenol to help with your discomfort 1g every 8 hours. Continue to use sits baths and Preparation H for the pain.  If your C. difficile test is negative then they can be started on Imodium to try to decrease the diarrhea.  If you are still having diarrhea prolonged for a long period of time after negative stool testing he may need to see a GI doctor.  Tonight your liver function tests were slightly elevated and you should have this followed up with GI regardless.

## 2019-12-26 NOTE — ED Notes (Addendum)
Pt rang call bell for assistance, when I entered the room pt states " I dont want to wait on fluids, I feel terrible and I want to go home". Advised pt I could go and get her fluids started immediately but pt reports she is ready to leave. Dr. Fuller Plan informed

## 2019-12-26 NOTE — ED Provider Notes (Signed)
[facility] Emergency Department Provider Note  ____________________________________________   First MD Initiated Contact with Patient 12/26/19 1847     (approximate)  I have reviewed the triage vital signs and the nursing notes.   HISTORY  Chief Complaint Diarrhea    HPI Cindy Pena is a 49 y.o. female with hypertension and thyroid disease who comes in with diarrhea.  Patient is had diarrhea for over a week now.  She was seen at outside ER and labs were done that were reassuring.  She is also taking anything to help with the diarrhea.  Diarrhea is intermittent, brownish in color, nothing makes it better, nothing makes it worse.  Is been associated with some pain with her internal hemorrhoids as well as some nausea.  She had some abdominal pain that was described as a cramping sensation 4 days ago but that is since resolved.  She denies recent antibiotic use.          Past Medical History:  Diagnosis Date  . Hypertension   . PCOS (polycystic ovarian syndrome)   . Thyroid disease     Patient Active Problem List   Diagnosis Date Noted  . Hepatic steatosis 02/20/2019  . Mild hyperlipidemia 02/02/2019  . Hypothyroidism due to acquired atrophy of thyroid 02/01/2019  . Essential hypertension 05/21/2018  . Rash 05/21/2018  . Slow transit constipation 10/20/2017  . Eczema 10/20/2017  . Morbid obesity with BMI of 40.0-44.9, adult (HCC) 04/19/2016  . Gastroesophageal reflux disease without esophagitis 07/08/2015  . PCOS (polycystic ovarian syndrome) 07/08/2015    Past Surgical History:  Procedure Laterality Date  . c section  1996   September- SON Birth  . CHOLECYSTECTOMY  2003  . DILATION AND CURETTAGE OF UTERUS  1996 & 2006  . ooph Right 1995   Right tube removed- mass was on tube.  Marland Kitchen TOTAL ABDOMINAL HYSTERECTOMY  2009   Right ovary attached to center of stomach.    Prior to Admission medications   Medication Sig Start Date End  Date Taking? Authorizing Provider  azithromycin (ZITHROMAX) 250 MG tablet Take 1 tablet (250 mg total) by mouth daily. Take first 2 tablets together, then 1 every day until finished. 07/13/19   Verlee Monte, NP  benzonatate (TESSALON) 100 MG capsule Take 1 capsule (100 mg total) by mouth 2 (two) times daily. 07/12/19   [name], MD  chlorpheniramine-HYDROcodone [facility] PENNKINETIC ER) 10-8 MG/5ML SUER Take 5 mLs by mouth 3 (three) times daily as needed for cough. 07/13/19   Verlee Monte, NP  docusate sodium (COLACE) 100 MG capsule Take 100 mg by mouth 2 (two) times daily.    [provider]  hydrocortisone (ANUSOL-HC) 25 MG suppository Place 1 suppository (25 mg total) rectally 2 (two) times daily. 12/25/19   Bennie Pierini, FNP  levothyroxine (SYNTHROID) 125 MCG tablet TAKE 1 TABLET(125 MCG) BY MOUTH DAILY BEFORE BREAKFAST 08/10/19   [name], MD  lisinopril (ZESTRIL) 10 MG tablet TAKE 1 TABLET(10 MG) BY MOUTH DAILY 07/12/19   [name], MD  metFORMIN (GLUCOPHAGE) 500 MG tablet TAKE 1 TABLET(500 MG) BY MOUTH DAILY WITH BREAKFAST 08/10/19   [name], MD  Misc Natural Products (ESTROVEN + ENERGY MAX STRENGTH PO) Take by mouth daily.    [provider]  omeprazole (PRILOSEC) 20 MG capsule TAKE 1 CAPSULE(20 MG) BY MOUTH DAILY 09/20/19   [name], MD  triamcinolone cream (KENALOG) 0.1 % Apply 1 application topically 2 (  two) times daily. To elbow rash 02/01/19   [name], MD  cimetidine (TAGAMET) 200 MG tablet Take 200 mg by mouth 2 (two) times daily.  07/13/19  [provider]    Allergies Darvon [propoxyphene], Penicillins, Tramadol hcl, and Other  Family History  Problem Relation Age of Onset  . Hypertension Father   . Prostate cancer Father   . Hypertension Sister   . Cancer Paternal Aunt   . Breast cancer Paternal Aunt     Social History Social History   Tobacco Use  . Smoking status: Never Smoker  .  Smokeless tobacco: Never Used  Substance Use Topics  . Alcohol use: No  . Drug use: No      Review of Systems Constitutional: No fever/chills Eyes: No visual changes. ENT: No sore throat. Cardiovascular: Denies chest pain. Respiratory: Denies shortness of breath. Gastrointestinal: No abdominal pain.  Positive diarrhea and nausea no constipation. Genitourinary: Negative for dysuria. Musculoskeletal: Negative for back pain. Skin: Negative for rash. Neurological: Negative for headaches, focal weakness or numbness. All other ROS negative ____________________________________________   PHYSICAL EXAM:  VITAL SIGNS: ED Triage Vitals  Enc Vitals Group     BP 12/26/19 1720 (!) 158/91     Pulse Rate 12/26/19 1720 (!) 115     Resp 12/26/19 1720 19     Temp 12/26/19 1720 98.4 F (36.9 C)     Temp Source 12/26/19 1720 Oral     SpO2 12/26/19 1720 98 %     Weight 12/26/19 1720 260 lb (117.9 kg)     Height 12/26/19 1720 5\' 3"  (1.6 m)     Head Circumference --      Peak Flow --      Pain Score 12/26/19 1724 7     Pain Loc --      Pain Edu? --      Excl. in GC? --     Constitutional: Alert and oriented. Well appearing and in no acute distress. Eyes: Conjunctivae are normal. EOMI. Head: Atraumatic. Nose: No congestion/rhinnorhea. Mouth/Throat: Mucous membranes are moist.   Neck: No stridor. Trachea Midline. FROM Cardiovascular: Tachycardia, regular rhythm. Grossly normal heart sounds.  Good peripheral circulation. Respiratory: Normal respiratory effort.  No retractions. Lungs CTAB. Gastrointestinal: Soft and nontender. No distention. No abdominal bruits.  Musculoskeletal: No lower extremity tenderness nor edema.  No joint effusions. Neurologic:  Normal speech and language. No gross focal neurologic deficits are appreciated.  Skin:  Skin is warm, dry and intact. No rash noted. Psychiatric: Mood and affect are normal. Speech and behavior are normal. GU: Internal hemorrhoids felt.   No abscess.  No thrombosed hemorrhoids. ____________________________________________   LABS (all labs ordered are listed, but only abnormal results are displayed)  Labs Reviewed  COMPREHENSIVE METABOLIC PANEL - Abnormal; Notable for the following components:      Result Value   Sodium 134 (*)    CO2 21 (*)    Glucose, Bld 152 (*)    Creatinine, Ser 1.09 (*)    AST 53 (*)    ALT 64 (*)    Total Bilirubin 1.9 (*)    GFR calc non Af Amer 60 (*)    All other components within normal limits  CBC - Abnormal; Notable for the following components:   RBC 5.37 (*)    All other components within normal limits  GI PATHOGEN PANEL BY PCR, STOOL  C DIFFICILE QUICK SCREEN W PCR REFLEX  LIPASE, BLOOD  URINALYSIS, COMPLETE (UACMP) WITH  MICROSCOPIC   ____________________________________________  RADIOLOGY   PROCEDURES  Procedure(s) performed (including Critical Care):  Ultrasound ED Peripheral IV (Provider)  Date/Time: 12/26/2019 9:29 PM Performed by: Vanessa Leona, MD Authorized by: Vanessa Zemple, MD   Procedure details:    Indications: hydration     Skin Prep: chlorhexidine gluconate     Location:  Right AC   Angiocath:  20 G   Bedside Ultrasound Guided: Yes     Images: not archived     Patient tolerated procedure without complications: Yes     Dressing applied: Yes       ____________________________________________   INITIAL IMPRESSION / ASSESSMENT AND PLAN / ED COURSE  Scottie L Swavely was evaluated in Emergency Department on 12/26/2019 for the symptoms described in the history of present illness. She was evaluated in the context of the global COVID-19 pandemic, which necessitated consideration that the patient might be at risk for infection with the SARS-CoV-2 virus that causes COVID-19. Institutional protocols and algorithms that pertain to the evaluation of patients at risk for COVID-19 are in a state of rapid change based on information released by regulatory  bodies including the CDC and federal and state organizations. These policies and algorithms were followed during the patient's care in the ED.     Patient is a well-appearing 49 year old who is tachycardic and presenting with diarrhea.  Will get labs to evaluate for electrolyte abnormalities, AKI.  Will send off stool samples to evaluate for C. difficile versus other bacterial infection.  Abdomen is soft and nontender at this time so low suspicion for bowel obstruction, perforation, diverticulitis.  We discussed CT imaging but opted to hold off at this time.  Labs are reassuring although AST and ALT are slightly elevated with a slightly elevated T bili.  Patient has no right upper quadrant tenderness and has had a history of cholecystectomy.  Discussed with patient follows up with her primary care doctor but i do not think that is the cause of her diarrhea today.  Her kidney function is slightly elevated at 1.09 and with her tachycardia we will give her a liter of fluid and some Zofran to help with the nausea.  No evidence of anemia to suggest GI bleed.  White count is normal which is reassuring.  Patient now request to be discharged.  Patient's been in the ER for 4 hours and unable to give a stool sample.  Patient's heart rates come down to 102.  Patient is tolerating p.o.  Patient states that she is  unable to give a stool sample but she would prefer to follow-up with her primary care doctor and get the stool sample to them.  We discussed symptomatic management for her hemorrhoids and that she could start Imodium after stool sample rules out C. difficile.  She states that she will call her doctor tomorrow morning to get her sample tested.    ____________________________________________   FINAL CLINICAL IMPRESSION(S) / ED DIAGNOSES   Final diagnoses:  Diarrhea, unspecified type      MEDICATIONS GIVEN DURING THIS VISIT:  Medications  sodium chloride 0.9 % bolus 1,000 mL (has no  administration in time range)  ondansetron (ZOFRAN) injection 4 mg (has no administration in time range)     ED Discharge Orders         Ordered    ondansetron (ZOFRAN ODT) 4 MG disintegrating tablet  Every 8 hours PRN     12/26/19 2112  Note:  This document was prepared using Dragon voice recognition software and may include unintentional dictation errors.   Concha Se, MD 12/26/19 2131

## 2019-12-26 NOTE — ED Triage Notes (Signed)
Patient presents to the ED with diarrhea since Christmas day.  Patient states she went to an ED last week and was told her symptoms were likely a virus and to not take anti-diarrheals.  Patient states for the past two days, diarrhea has just been pure liquid and she has felt worse.  Patient denies vomiting.  Patient denies abdominal pain other than occasional cramping.

## 2019-12-26 NOTE — ED Notes (Signed)
Pt c/o chronic diarrhea since christmas. Pt reports new onset of nausea the past few days. Pt also states she is unable to keep any food down

## 2020-01-01 ENCOUNTER — Ambulatory Visit (INDEPENDENT_AMBULATORY_CARE_PROVIDER_SITE_OTHER): Payer: BC Managed Care – PPO | Admitting: Gastroenterology

## 2020-01-01 ENCOUNTER — Encounter: Payer: Self-pay | Admitting: Gastroenterology

## 2020-01-01 ENCOUNTER — Other Ambulatory Visit: Payer: Self-pay

## 2020-01-01 VITALS — BP 123/69 | HR 81 | Temp 95.8°F | Resp 17 | Ht 63.0 in | Wt 264.4 lb

## 2020-01-01 DIAGNOSIS — K760 Fatty (change of) liver, not elsewhere classified: Secondary | ICD-10-CM

## 2020-01-01 DIAGNOSIS — R197 Diarrhea, unspecified: Secondary | ICD-10-CM

## 2020-01-01 DIAGNOSIS — Z6841 Body Mass Index (BMI) 40.0 and over, adult: Secondary | ICD-10-CM

## 2020-01-01 DIAGNOSIS — K600 Acute anal fissure: Secondary | ICD-10-CM

## 2020-01-01 MED ORDER — DICYCLOMINE HCL 10 MG PO CAPS: 10.0000 mg | ORAL_CAPSULE | Freq: Three times a day (TID) | ORAL | 0 refills | Status: DC

## 2020-01-01 NOTE — Progress Notes (Signed)
Cephas Darby, MD [address]  [place]  [place], [place] [address]  Main: [phone]  Fax: [phone]    Gastroenterology Consultation  Referring Provider:     Glean Hess, MD Primary Care Physician:  Glean Hess, MD Primary Gastroenterologist:  Dr. Cephas Darby Reason for Consultation: Chronic diarrhea and rectal pain        HPI:   Cindy Pena is a 49 y.o. female referred by Dr. Army Melia, Jesse Sans, MD  for consultation & management of new onset of diarrhea that started on day of Christmas, nonbloody, watery bowel movements up to 6 times a day, mostly postprandial, develops abdominal cramps 30 minutes after eating with bowel urgency.  She went to ER on 1/14 because of diarrhea.  CBC, lipase were unremarkable.  LFTs revealed mildly elevated transaminases.  Stool studies were recommended but were not done as she could not produce any stool.  She received IV fluids.  Since then, she reports that her diarrhea has improved as she has been avoiding sodas and fatty foods, modified her diet.  Currently, having up to 3 bowel movements a day which are loose.  However, she has developed severe rectal pain, sharp, tearing type which is worse with bowel movement and last for about 30 to 60 minutes after BM.  Her most concern is the rectal pain.  She has been using hydrocortisone suppositories with not much relief.  Prior to these symptoms, she reports having constipation, going about 2 times a week, requiring stool softener. Her lifestyle is sedentary, known history of fatty liver with elevated transaminases since 10/2017, right upper current ultrasound revealed fatty liver in 2020. Patient is on Metformin for history of PCOS She is on Synthroid for history of hypothyroidism  NSAIDs: None  Antiplts/Anticoagulants/Anti thrombotics: None  GI Procedures: None She denies family history of GI malignancy  Past Medical History:  Diagnosis Date  . Hypertension     . PCOS (polycystic ovarian syndrome)   . Thyroid disease     Past Surgical History:  Procedure Laterality Date  . c section  1996   September- SON Birth  . CHOLECYSTECTOMY  2003  . [place] & 2006  . ooph Right 1995   Right tube removed- mass was on tube.  Marland Kitchen TOTAL ABDOMINAL HYSTERECTOMY  2009   Right ovary attached to center of stomach.    Current Outpatient Medications:  .  docusate sodium (COLACE) 100 MG capsule, Take 100 mg by mouth 2 (two) times daily., Disp: , Rfl:  .  hydrocortisone (ANUSOL-HC) 25 MG suppository, Place 1 suppository (25 mg total) rectally 2 (two) times daily., Disp: 12 suppository, Rfl: 0 .  levothyroxine (SYNTHROID) 125 MCG tablet, TAKE 1 TABLET(125 MCG) BY MOUTH DAILY BEFORE BREAKFAST, Disp: 30 tablet, Rfl: 5 .  lisinopril (ZESTRIL) 10 MG tablet, TAKE 1 TABLET(10 MG) BY MOUTH DAILY, Disp: 30 tablet, Rfl: 5 .  metFORMIN (GLUCOPHAGE) 500 MG tablet, TAKE 1 TABLET(500 MG) BY MOUTH DAILY WITH BREAKFAST, Disp: 30 tablet, Rfl: 5 .  Misc Natural Products (ESTROVEN + ENERGY MAX STRENGTH PO), Take by mouth daily., Disp: , Rfl:  .  omeprazole (PRILOSEC) 20 MG capsule, TAKE 1 CAPSULE(20 MG) BY MOUTH DAILY, Disp: 30 capsule, Rfl: 5 .  polyethylene glycol powder (GLYCOLAX/MIRALAX) 17 GM/SCOOP powder, MIX 1 PACKET IN 4 TO 8 OUNCES OF FLUID AND DRINK ONCE DAILY FOR 3 DAYS AS DIRECTED, Disp: , Rfl:  .  triamcinolone cream (  KENALOG) 0.1 %, Apply 1 application topically 2 (two) times daily. To elbow rash, Disp: 30 g, Rfl: 0 .  dicyclomine (BENTYL) 10 MG capsule, Take 1 capsule (10 mg total) by mouth 4 (four) times daily -  before meals and at bedtime., Disp: 120 capsule, Rfl: 0    Family History  Problem Relation Age of Onset  . Hypertension Father   . Prostate cancer Father   . Hypertension Sister   . Cancer Paternal Aunt   . Breast cancer Paternal Aunt      Social History   Tobacco Use  . Smoking status: Never Smoker  . Smokeless  tobacco: Never Used  Substance Use Topics  . Alcohol use: No  . Drug use: No    Allergies as of 01/01/2020 - Review Complete 01/01/2020  Allergen Reaction Noted  . Darvon [propoxyphene] Other (See Comments) 05/31/2017  . Penicillins Other (See Comments) 05/31/2017  . Tramadol hcl Rash 05/31/2017  . Other Other (See Comments) 12/25/2018    Review of Systems:    All systems reviewed and negative except where noted in HPI.   Physical Exam:  BP 123/69 (BP Location: Left Arm, Patient Position: Sitting, Cuff Size: Large)   Pulse 81   Temp (!) 95.8 F (35.4 C)   Resp 17   Ht 5\' 3"  (1.6 m)   Wt 264 lb 6.4 oz (119.9 kg)   BMI 46.84 kg/m  No LMP recorded. Patient has had a hysterectomy.  General:   Alert,  Well-developed, well-nourished, pleasant and cooperative in NAD Head:  Normocephalic and atraumatic. Eyes:  Sclera clear, no icterus.   Conjunctiva pink. Ears:  Normal auditory acuity. Nose:  No deformity, discharge, or lesions. Mouth:  No deformity or lesions,oropharynx pink & moist. Neck:  Supple; no masses or thyromegaly. Lungs:  Respirations even and unlabored.  Clear throughout to auscultation.   No wheezes, crackles, or rhonchi. No acute distress. Heart:  Regular rate and rhythm; no murmurs, clicks, rubs, or gallops. Abdomen:  Normal bowel sounds. Soft, morbidly obese, non-tender and non-distended without masses, hepatosplenomegaly or hernias noted.  No guarding or rebound tenderness.   Rectal: Sharp tenderness in the posterior wall of anal canal, normal perianal skin Msk:  Symmetrical without gross deformities. Good, equal movement & strength bilaterally. Pulses:  Normal pulses noted. Extremities:  No clubbing or edema.  No cyanosis. Neurologic:  Alert and oriented x3;  grossly normal neurologically. Skin:  Intact without significant lesions or rashes. No jaundice. Psych:  Alert and cooperative. Normal mood and affect.  Imaging Studies: Reviewed  Assessment and Plan:     Cindy Pena is a 49 y.o. female with morbid obesity, hypothyroidism, nonalcoholic fatty liver disease is seen in consultation for chronic rectal pain, unexplained diarrhea  Unexplained nonbloody diarrhea: Most likely osmotic diarrhea secondary to carbonated beverages.  Her diarrhea has now improved Reiterated to continue to avoid soft drinks, fruit juices, artificial sweeteners Check stool studies to rule out infection If negative, will evaluate for celiac disease and H. pylori infection Trial of Bentyl as needed If diarrhea persists despite above work-up, recommend colonoscopy with TI evaluation and biopsies  Acute posterior anal fissure Recommend 0.125% nitroglycerin with 5% lidocaine, instructions provided  Nonalcoholic fatty liver disease and morbid obesity Negative for hepatitis A, B and C Discussed with her about healthy lifestyle, weight loss and exercise to prevent further progression of fatty liver to cirrhosis We will refer her to [facility] for weight loss management  Follow up in 1 month   [name], MD

## 2020-01-09 ENCOUNTER — Ambulatory Visit: Payer: BC Managed Care – PPO | Admitting: Gastroenterology

## 2020-01-10 ENCOUNTER — Other Ambulatory Visit: Payer: Self-pay | Admitting: Internal Medicine

## 2020-01-12 LAB — GI PROFILE, STOOL, PCR

## 2020-01-12 LAB — SPECIMEN STATUS REPORT

## 2020-01-14 ENCOUNTER — Encounter: Payer: Self-pay | Admitting: Gastroenterology

## 2020-01-14 ENCOUNTER — Encounter: Payer: BLUE CROSS/BLUE SHIELD | Admitting: Internal Medicine

## 2020-01-15 ENCOUNTER — Encounter: Payer: Self-pay | Admitting: Gastroenterology

## 2020-01-20 ENCOUNTER — Ambulatory Visit: Payer: BC Managed Care – PPO | Admitting: Gastroenterology

## 2020-01-24 ENCOUNTER — Encounter: Payer: Self-pay | Admitting: Gastroenterology

## 2020-01-27 ENCOUNTER — Other Ambulatory Visit: Payer: Self-pay | Admitting: Gastroenterology

## 2020-01-27 DIAGNOSIS — K5904 Chronic idiopathic constipation: Secondary | ICD-10-CM

## 2020-01-27 MED ORDER — LINACLOTIDE 145 MCG PO CAPS: 145.0000 ug | ORAL_CAPSULE | Freq: Every day | ORAL | 2 refills | Status: DC

## 2020-02-07 ENCOUNTER — Encounter: Payer: Self-pay | Admitting: Internal Medicine

## 2020-02-07 ENCOUNTER — Ambulatory Visit (INDEPENDENT_AMBULATORY_CARE_PROVIDER_SITE_OTHER): Payer: BC Managed Care – PPO | Admitting: Internal Medicine

## 2020-02-07 ENCOUNTER — Other Ambulatory Visit: Payer: Self-pay | Admitting: Internal Medicine

## 2020-02-07 ENCOUNTER — Other Ambulatory Visit: Payer: Self-pay

## 2020-02-07 ENCOUNTER — Ambulatory Visit
Admission: RE | Admit: 2020-02-07 | Discharge: 2020-02-07 | Disposition: A | Discharge status: Home / Self Care | Payer: BC Managed Care – PPO | Source: Ambulatory Visit | Attending: Internal Medicine | Admitting: Internal Medicine

## 2020-02-07 VITALS — BP 118/76 | HR 78 | Temp 98.0°F | Ht 63.0 in | Wt 259.0 lb

## 2020-02-07 DIAGNOSIS — I10 Essential (primary) hypertension: Secondary | ICD-10-CM

## 2020-02-07 DIAGNOSIS — E034 Atrophy of thyroid (acquired): Secondary | ICD-10-CM

## 2020-02-07 DIAGNOSIS — R7989 Other specified abnormal findings of blood chemistry: Secondary | ICD-10-CM

## 2020-02-07 DIAGNOSIS — E785 Hyperlipidemia, unspecified: Secondary | ICD-10-CM

## 2020-02-07 DIAGNOSIS — Z1231 Encounter for screening mammogram for malignant neoplasm of breast: Secondary | ICD-10-CM | POA: Insufficient documentation

## 2020-02-07 DIAGNOSIS — R002 Palpitations: Secondary | ICD-10-CM

## 2020-02-07 DIAGNOSIS — K219 Gastro-esophageal reflux disease without esophagitis: Secondary | ICD-10-CM

## 2020-02-07 DIAGNOSIS — Z1211 Encounter for screening for malignant neoplasm of colon: Secondary | ICD-10-CM | POA: Diagnosis not present

## 2020-02-07 DIAGNOSIS — Z0000 Encounter for general adult medical examination without abnormal findings: Secondary | ICD-10-CM | POA: Diagnosis not present

## 2020-02-07 DIAGNOSIS — E282 Polycystic ovarian syndrome: Secondary | ICD-10-CM

## 2020-02-07 LAB — POCT URINALYSIS DIPSTICK
Bilirubin, UA: NEGATIVE
Blood, UA: NEGATIVE
Glucose, UA: NEGATIVE
Ketones, UA: NEGATIVE
Leukocytes, UA: NEGATIVE
Nitrite, UA: NEGATIVE
Protein, UA: NEGATIVE
Spec Grav, UA: 1.02 (ref 1.010–1.025)
Urobilinogen, UA: 0.2 E.U./dL
pH, UA: 6 (ref 5.0–8.0)

## 2020-02-07 NOTE — Progress Notes (Signed)
Date:  02/07/2020   Name:  Cindy Pena   DOB:  1971-11-17   MRN:  [id number]   Chief Complaint: Annual Exam (Breast Exam. NO PAP.) and Anxiety (GAD7-7) Cindy Pena is a 49 y.o. female who presents today for her Complete Annual Exam. She feels well. She reports exercising rarely. She reports she is sleeping fairly well. She denies breast issues.  Diarrhea/anal fissure: Recently seen by GI for diarrhea ultimately thought to be osmotic from carbonated beverages.  Workup was negative but liver tests were slightly elevated.  She also had a anal fissure treated with NTG cream.  She has GI follow up next week.  Mammogram - today Pap discontinued Colonoscopy - due soon Immunization History  Administered Date(s) Administered  . Tdap 07/08/2015    Hypertension This is a chronic problem. The problem is controlled. Associated symptoms include palpitations (possibly the source of her anxious feelings). Pertinent negatives include no chest pain, headaches or shortness of breath. Past treatments include ACE inhibitors. The current treatment provides significant improvement. There are no compliance problems.  Identifiable causes of hypertension include a thyroid problem.  Gastroesophageal Reflux She complains of heartburn. She reports no abdominal pain, no chest pain, no coughing or no wheezing. This is a recurrent problem. The problem occurs rarely. Pertinent negatives include no fatigue. She has tried a PPI for the symptoms.  Thyroid Problem Presents for follow-up visit. Symptoms include palpitations (possibly the source of her anxious feelings). Patient reports no anxiety, constipation, depressed mood, diarrhea, fatigue, hair loss, leg swelling, tremors or weight gain. The symptoms have been stable.  Anxiety - pt describes episodes of feeling anxious with fluttering or pressure in her chest.  There are no specific triggers, can happen any time.  Symptoms resolve spontaneously.   She has not measured her pulse rate at these times.  No associated SOB, panic attacks or fear.  No other physical symptoms and in between times she feels normal. These episodes occurring intermittently with no pattern.  The last episode was three days ago.   Lab Results  Component Value Date   CREATININE 1.09 (H) 12/26/2019   BUN 16 12/26/2019   NA 134 (L) 12/26/2019   K 4.1 12/26/2019   CL 101 12/26/2019   CO2 21 (L) 12/26/2019   Lab Results  Component Value Date   CHOL 215 (H) 02/01/2019   HDL 52 02/01/2019   LDLCALC 140 (H) 02/01/2019   TRIG 115 02/01/2019   CHOLHDL 4.1 02/01/2019   Lab Results  Component Value Date   TSH 0.937 02/01/2019   Lab Results  Component Value Date   HGBA1C 5.3 02/01/2019   Lab Results  Component Value Date   ALT 64 (H) 12/26/2019   AST 53 (H) 12/26/2019   ALKPHOS 94 12/26/2019   BILITOT 1.9 (H) 12/26/2019      Review of Systems  Constitutional: Negative for chills, fatigue, fever and weight gain.  HENT: Negative for congestion, hearing loss, tinnitus, trouble swallowing and voice change.   Eyes: Negative for visual disturbance.  Respiratory: Negative for cough, chest tightness, shortness of breath and wheezing.   Cardiovascular: Positive for palpitations (possibly the source of her anxious feelings). Negative for chest pain and leg swelling.  Gastrointestinal: Positive for heartburn. Negative for abdominal pain, constipation, diarrhea and vomiting.  Endocrine: Negative for polydipsia and polyuria.  Genitourinary: Negative for dysuria, frequency, genital sores, vaginal bleeding and vaginal discharge.  Musculoskeletal: Negative for arthralgias, gait problem and joint swelling.  Skin: Negative for color change and rash.  Neurological: Negative for dizziness, tremors, light-headedness and headaches.  Hematological: Negative for adenopathy. Does not bruise/bleed easily.  Psychiatric/Behavioral: Negative for dysphoric mood and sleep  disturbance. The patient is not nervous/anxious.     Patient Active Problem List   Diagnosis Date Noted  . Hepatic steatosis 02/20/2019  . Mild hyperlipidemia 02/02/2019  . Hypothyroidism due to acquired atrophy of thyroid 02/01/2019  . Essential hypertension 05/21/2018  . Rash 05/21/2018  . Eczema 10/20/2017  . Morbid obesity with BMI of 40.0-44.9, adult (HCC) 04/19/2016  . Gastroesophageal reflux disease without esophagitis 07/08/2015  . PCOS (polycystic ovarian syndrome) 07/08/2015    Allergies  Allergen Reactions  . Darvon [Propoxyphene] Other (See Comments)    Chest Pain.  Marland Kitchen Penicillins Other (See Comments)    Migraines.  . Tramadol Hcl Rash  . Other Other (See Comments)    Past Surgical History:  Procedure Laterality Date  . c section  1996   September- SON Birth  . CHOLECYSTECTOMY  2003  . DILATION AND CURETTAGE OF UTERUS  1996 & 2006  . ooph Right 1995   Right tube removed- mass was on tube.  Marland Kitchen TOTAL ABDOMINAL HYSTERECTOMY  2009   Right ovary attached to center of stomach.    Social History   Tobacco Use  . Smoking status: Never Smoker  . Smokeless tobacco: Never Used  Substance Use Topics  . Alcohol use: No  . Drug use: No     Medication list has been reviewed and updated.  Current Meds  Medication Sig  . docusate sodium (COLACE) 100 MG capsule Take 100 mg by mouth 2 (two) times daily.  Marland Kitchen levothyroxine (SYNTHROID) 125 MCG tablet TAKE 1 TABLET(125 MCG) BY MOUTH DAILY BEFORE BREAKFAST  . linaclotide (LINZESS) 145 MCG CAPS capsule Take 1 capsule (145 mcg total) by mouth daily before breakfast.  . lisinopril (ZESTRIL) 10 MG tablet TAKE 1 TABLET(10 MG) BY MOUTH DAILY  . metFORMIN (GLUCOPHAGE) 500 MG tablet TAKE 1 TABLET(500 MG) BY MOUTH DAILY WITH BREAKFAST  . Misc Natural Products (ESTROVEN + ENERGY MAX STRENGTH PO) Take by mouth daily.  Marland Kitchen omeprazole (PRILOSEC) 20 MG capsule TAKE 1 CAPSULE(20 MG) BY MOUTH DAILY  . triamcinolone cream (KENALOG) 0.1 %  Apply 1 application topically 2 (two) times daily. To elbow rash    PHQ 2/9 Scores 02/07/2020 02/01/2019 12/25/2018 10/20/2017  PHQ - 2 Score 0 1 0 0  PHQ- 9 Score 0 - - -   GAD 7 : Generalized Anxiety Score 02/07/2020  Nervous, Anxious, on Edge 2  Control/stop worrying 0  Worry too much - different things 0  Trouble relaxing 0  Restless 2  Easily annoyed or irritable 0  Afraid - awful might happen 2  Total GAD 7 Score 6  Anxiety Difficulty Very difficult      BP Readings from Last 3 Encounters:  02/07/20 118/76  01/01/20 123/69  12/26/19 (!) 143/82    Physical Exam Vitals and nursing note reviewed.  Constitutional:      General: She is not in acute distress.    Appearance: She is well-developed.  HENT:     Head: Normocephalic and atraumatic.     Right Ear: Tympanic membrane and ear canal normal.     Left Ear: Tympanic membrane and ear canal normal.     Nose:     Right Sinus: No maxillary sinus tenderness.     Left Sinus: No maxillary sinus tenderness.  Eyes:  General: No scleral icterus.       Right eye: No discharge.        Left eye: No discharge.     Conjunctiva/sclera: Conjunctivae normal.  Neck:     Thyroid: No thyromegaly.     Vascular: No carotid bruit.  Cardiovascular:     Rate and Rhythm: Normal rate and regular rhythm.  No extrasystoles are present.    Pulses: Normal pulses.     Heart sounds: Normal heart sounds. No murmur.  Pulmonary:     Effort: Pulmonary effort is normal. No respiratory distress.     Breath sounds: No wheezing.  Chest:     Breasts:        Right: No mass, nipple discharge, skin change or tenderness.        Left: No mass, nipple discharge, skin change or tenderness.  Abdominal:     General: Bowel sounds are normal.     Palpations: Abdomen is soft.     Tenderness: There is no abdominal tenderness.  Musculoskeletal:        General: Normal range of motion.     Cervical back: Normal range of motion. No erythema.     Right lower  leg: No edema.     Left lower leg: No edema.  Lymphadenopathy:     Cervical: No cervical adenopathy.  Skin:    General: Skin is warm and dry.     Capillary Refill: Capillary refill takes less than 2 seconds.     Findings: No rash.  Neurological:     General: No focal deficit present.     Mental Status: She is alert and oriented to person, place, and time.     Cranial Nerves: No cranial nerve deficit.     Sensory: No sensory deficit.     Deep Tendon Reflexes: Reflexes are normal and symmetric.  Psychiatric:        Speech: Speech normal.        Behavior: Behavior normal.        Thought Content: Thought content normal.     Wt Readings from Last 3 Encounters:  02/07/20 259 lb (117.5 kg)  01/01/20 264 lb 6.4 oz (119.9 kg)  12/26/19 260 lb (117.9 kg)    BP 118/76   Pulse 78   Temp 98 F (36.7 C) (Oral)   Ht 5\' 3"  (1.6 m)   Wt 259 lb (117.5 kg)   SpO2 97%   BMI 45.88 kg/m   Assessment and Plan: 1. Annual physical exam Normal exam except for weight - POCT urinalysis dipstick  2. Encounter for screening mammogram for breast cancer Scheduled for later today  3. Essential hypertension Clinically stable exam with well controlled BP on lisinopril 10 mg. Tolerating medications without side effects at this time. Pt to continue current regimen and low sodium diet; benefits of regular exercise as able discussed.  4. Gastroesophageal reflux disease without esophagitis Symptoms well controlled on daily PPI No red flag signs such as weight loss, n/v, melena Will continue omeprozole.  5. Hypothyroidism due to acquired atrophy of thyroid Supplemented; adjust dose as needed - TSH + free T4  6. Mild hyperlipidemia Check labs and advise - Lipid panel  7. PCOS (polycystic ovarian syndrome) - Hemoglobin A1c  8. Colon cancer screening Due for screening at age 16 Pt will also discuss at GI follow up next week - Ambulatory referral to Gastroenterology  9. Elevated liver  function tests Suspect fatty liver but will repeat  - Comprehensive metabolic  panel  10. Intermittent palpitations I suspect that her "anxiety" is actually palpitations.  Exam is regular today with no ectopy.  Recommend a heart monitor but pt would like to hold off for now and follow up if symptoms are becoming more frequent or prolonged. Continue to avoid soft drinks and caffeine   Partially dictated using Animal nutritionist. Any errors are unintentional.  Bari Edward, MD [facility]  02/07/2020

## 2020-02-08 LAB — TSH+FREE T4
Free T4: 1.87 ng/dL — ABNORMAL HIGH (ref 0.82–1.77)
TSH: 0.834 u[IU]/mL (ref 0.450–4.500)

## 2020-02-08 LAB — LIPID PANEL
Chol/HDL Ratio: 4.2 ratio (ref 0.0–4.4)
Cholesterol, Total: 219 mg/dL — ABNORMAL HIGH (ref 100–199)
HDL: 52 mg/dL (ref >39)
LDL Chol Calc (NIH): 141 mg/dL — ABNORMAL HIGH (ref 0–99)
Triglycerides: 144 mg/dL (ref 0–149)
VLDL Cholesterol Cal: 26 mg/dL (ref 5–40)

## 2020-02-08 LAB — COMPREHENSIVE METABOLIC PANEL
ALT: 70 IU/L — ABNORMAL HIGH (ref 0–32)
AST: 59 IU/L — ABNORMAL HIGH (ref 0–40)
Albumin/Globulin Ratio: 1.7 (ref 1.2–2.2)
Albumin: 4.7 g/dL (ref 3.8–4.8)
Alkaline Phosphatase: 136 IU/L — ABNORMAL HIGH (ref 39–117)
BUN/Creatinine Ratio: 18 (ref 9–23)
BUN: 16 mg/dL (ref 6–24)
Bilirubin Total: 0.5 mg/dL (ref 0.0–1.2)
CO2: 22 mmol/L (ref 20–29)
Calcium: 9.6 mg/dL (ref 8.7–10.2)
Chloride: 103 mmol/L (ref 96–106)
Creatinine, Ser: 0.9 mg/dL (ref 0.57–1.00)
GFR calc Af Amer: 87 mL/min/{1.73_m2} (ref >59)
GFR calc non Af Amer: 75 mL/min/{1.73_m2} (ref >59)
Globulin, Total: 2.8 g/dL (ref 1.5–4.5)
Glucose: 90 mg/dL (ref 65–99)
Potassium: 4.3 mmol/L (ref 3.5–5.2)
Sodium: 141 mmol/L (ref 134–144)
Total Protein: 7.5 g/dL (ref 6.0–8.5)

## 2020-02-08 LAB — HEMOGLOBIN A1C
Est. average glucose Bld gHb Est-mCnc: 103 mg/dL
Hgb A1c MFr Bld: 5.2 % (ref 4.8–5.6)

## 2020-02-10 ENCOUNTER — Encounter: Payer: Self-pay | Admitting: Internal Medicine

## 2020-02-12 ENCOUNTER — Ambulatory Visit (INDEPENDENT_AMBULATORY_CARE_PROVIDER_SITE_OTHER): Payer: BC Managed Care – PPO | Admitting: Gastroenterology

## 2020-02-12 ENCOUNTER — Other Ambulatory Visit: Payer: Self-pay

## 2020-02-12 ENCOUNTER — Encounter: Payer: Self-pay | Admitting: Gastroenterology

## 2020-02-12 VITALS — BP 116/71 | HR 93 | Temp 97.6°F | Wt 259.0 lb

## 2020-02-12 DIAGNOSIS — K760 Fatty (change of) liver, not elsewhere classified: Secondary | ICD-10-CM

## 2020-02-12 DIAGNOSIS — R7989 Other specified abnormal findings of blood chemistry: Secondary | ICD-10-CM

## 2020-02-12 DIAGNOSIS — K5900 Constipation, unspecified: Secondary | ICD-10-CM

## 2020-02-12 DIAGNOSIS — E8881 Metabolic syndrome: Secondary | ICD-10-CM | POA: Diagnosis not present

## 2020-02-12 DIAGNOSIS — Z1211 Encounter for screening for malignant neoplasm of colon: Secondary | ICD-10-CM

## 2020-02-12 MED ORDER — NA SULFATE-K SULFATE-MG SULF 17.5-3.13-1.6 GM/177ML PO SOLN: 354.0000 mL | Freq: Once | ORAL | 0 refills | Status: AC

## 2020-02-12 NOTE — Progress Notes (Signed)
[name], MD [address]  [address]  [place], [place] [address]  Main: [phone]  Fax: [phone]    Gastroenterology Consultation  Referring Provider:     [name], MD Primary Care Physician:  [name], MD Primary Gastroenterologist:  Dr. [name] Reason for Consultation: Chronic diarrhea and rectal pain        HPI:   Cindy Pena is a 49 y.o. female referred by Dr. Judithann Graves, Nyoka Cowden, MD  for consultation & management of new onset of diarrhea that started on day of Christmas, nonbloody, watery bowel movements up to 6 times a day, mostly postprandial, develops abdominal cramps 30 minutes after eating with bowel urgency.  She went to ER on 1/14 because of diarrhea.  CBC, lipase were unremarkable.  LFTs revealed mildly elevated transaminases.  Stool studies were recommended but were not done as she could not produce any stool.  She received IV fluids.  Since then, she reports that her diarrhea has improved as she has been avoiding sodas and fatty foods, modified her diet.  Currently, having up to 3 bowel movements a day which are loose.  However, she has developed severe rectal pain, sharp, tearing type which is worse with bowel movement and last for about 30 to 60 minutes after BM.  Her most concern is the rectal pain.  She has been using hydrocortisone suppositories with not much relief.  Prior to these symptoms, she reports having constipation, going about 2 times a week, requiring stool softener. Her lifestyle is sedentary, known history of fatty liver with elevated transaminases since 10/2017, right upper current ultrasound revealed fatty liver in 2020. Patient is on Metformin for history of PCOS She is on Synthroid for history of hypothyroidism  Follow-up visit 02/12/2020 Patient reports that her rectal pain has resolved.  She did not use topical nitroglycerin.  She is no longer experiencing diarrhea after dietary modification.  In  fact, she was experiencing constipation, I started her on Linzess 145 MCG.  Her main concern today is intermittent early morning nausea and epigastric discomfort, particularly when she misses the breakfast.  She wanted to discuss about screening colonoscopy as recommended by her PCP.  NSAIDs: None  Antiplts/Anticoagulants/Anti thrombotics: None  GI Procedures: None She denies family history of GI malignancy  Past Medical History:  Diagnosis Date  . PCOS (polycystic ovarian syndrome)   . Thyroid disease     Past Surgical History:  Procedure Laterality Date  . c section  1996   September- SON Birth  . CHOLECYSTECTOMY  2003  . DILATION AND CURETTAGE OF UTERUS  1996 & 2006  . ooph Right 1995   Right tube removed- mass was on tube.  Marland Kitchen TOTAL ABDOMINAL HYSTERECTOMY  2009   Right ovary attached to center of stomach.    Current Outpatient Medications:  .  docusate sodium (COLACE) 100 MG capsule, Take 100 mg by mouth 2 (two) times daily., Disp: , Rfl:  .  levothyroxine (SYNTHROID) 125 MCG tablet, TAKE 1 TABLET(125 MCG) BY MOUTH DAILY BEFORE BREAKFAST, Disp: 30 tablet, Rfl: 5 .  linaclotide (LINZESS) 145 MCG CAPS capsule, Take 1 capsule (145 mcg total) by mouth daily before breakfast., Disp: 30 capsule, Rfl: 2 .  lisinopril (ZESTRIL) 10 MG tablet, TAKE 1 TABLET(10 MG) BY MOUTH DAILY, Disp: 30 tablet, Rfl: 5 .  metFORMIN (GLUCOPHAGE) 500 MG tablet, TAKE 1 TABLET(500 MG) BY MOUTH DAILY WITH BREAKFAST, Disp: 30 tablet, Rfl: 5 .  Misc  Natural Products (ESTROVEN + ENERGY MAX STRENGTH PO), Take by mouth daily., Disp: , Rfl:  .  omeprazole (PRILOSEC) 20 MG capsule, TAKE 1 CAPSULE(20 MG) BY MOUTH DAILY, Disp: 30 capsule, Rfl: 5 .  triamcinolone cream (KENALOG) 0.1 %, Apply 1 application topically 2 (two) times daily. To elbow rash, Disp: 30 g, Rfl: 0 .  Na Sulfate-K Sulfate-Mg Sulf 17.5-3.13-1.6 GM/177ML SOLN, Take 354 mLs by mouth once for 1 dose., Disp: 354 mL, Rfl: 0    Family History    Problem Relation Age of Onset  . Hypertension Father   . Prostate cancer Father   . Hypertension Sister   . Cancer Paternal Aunt   . Breast cancer Paternal Aunt      Social History   Tobacco Use  . Smoking status: Never Smoker  . Smokeless tobacco: Never Used  Substance Use Topics  . Alcohol use: No  . Drug use: No    Allergies as of 02/12/2020 - Review Complete 02/12/2020  Allergen Reaction Noted  . Darvon [propoxyphene] Other (See Comments) 05/31/2017  . Penicillins Other (See Comments) 05/31/2017  . Tramadol hcl Rash 05/31/2017  . Other Other (See Comments) 12/25/2018    Review of Systems:    All systems reviewed and negative except where noted in HPI.   Physical Exam:  BP 116/71 (BP Location: Left Arm, Patient Position: Sitting, Cuff Size: Normal)   Pulse 93   Temp 97.6 F (36.4 C) (Oral)   Wt 259 lb (117.5 kg)   BMI 45.88 kg/m  No LMP recorded. Patient has had a hysterectomy.  General:   Alert,  Well-developed, well-nourished, pleasant and cooperative in NAD Head:  Normocephalic and atraumatic. Eyes:  Sclera clear, no icterus.   Conjunctiva pink. Ears:  Normal auditory acuity. Nose:  No deformity, discharge, or lesions. Mouth:  No deformity or lesions,oropharynx pink & moist. Neck:  Supple; no masses or thyromegaly. Lungs:  Respirations even and unlabored.  Clear throughout to auscultation.   No wheezes, crackles, or rhonchi. No acute distress. Heart:  Regular rate and rhythm; no murmurs, clicks, rubs, or gallops. Abdomen:  Normal bowel sounds. Soft, morbidly obese, non-tender and non-distended without masses or hernias noted.  No guarding or rebound tenderness.   Rectal: Not performed Msk:  Symmetrical without gross deformities. Good, equal movement & strength bilaterally. Pulses:  Normal pulses noted. Extremities:  No clubbing or edema.  No cyanosis. Neurologic:  Alert and oriented x3;  grossly normal neurologically. Skin:  Intact without significant  lesions or rashes. No jaundice. Psych:  Alert and cooperative. Normal mood and affect.  Imaging Studies: Reviewed  Assessment and Plan:   Cindy Pena is a 49 y.o. female with morbid obesity, PCOS, hypothyroidism, nonalcoholic fatty liver disease is seen for follow-up of constipation, discuss about screening colonoscopy Diarrhea has resolved since last visit after discontinuation of carbonated beverages and sugary drinks.  Rectal pain has resolved  Constipation Continue Linzess 145 MCG daily Reiterated on high-fiber diet, fiber supplements Adequate intake of water  Colon cancer screening Discussed about screening colonoscopy and patient is agreeable  I have discussed alternative options, risks & benefits,  which include, but are not limited to, bleeding, infection, perforation,respiratory complication & drug reaction.  The patient agrees with this plan & written consent will be obtained.    Nonalcoholic fatty liver disease , elevated LFTs Negative for hepatitis A, B and C Discussed with her about healthy lifestyle, weight loss and exercise to prevent further progression of fatty liver to  cirrhosis Patient [facility] for weight loss management in April 2021 Recheck LFTs in 6 months, if still elevated, will pursue rest of the secondary liver disease work-up  History of GERD Currently maintained on omeprazole 20 mg daily She is following antireflux lifestyle with symptoms under control  Nausea and epigastric discomfort Offered H. pylori breath test, patient would like to defer at this time   Follow up in 6 months   [name], MD

## 2020-02-24 ENCOUNTER — Telehealth: Payer: Self-pay | Admitting: Gastroenterology

## 2020-02-24 NOTE — Telephone Encounter (Signed)
Patient called to cancel colonoscopy & will call to reschedule. She is unable to do because of work.

## 2020-02-24 NOTE — Telephone Encounter (Signed)
Called patient and patient states that she does not know when she can rescheduled the procedure. Patient will call back. Called Trish and canceled the procedure.

## 2020-02-25 ENCOUNTER — Other Ambulatory Visit: Payer: Self-pay | Admitting: Internal Medicine

## 2020-02-25 DIAGNOSIS — K219 Gastro-esophageal reflux disease without esophagitis: Secondary | ICD-10-CM

## 2020-02-28 ENCOUNTER — Telehealth: Payer: Self-pay

## 2020-02-28 NOTE — Telephone Encounter (Signed)
Patient is calling because she received a EOB from her insurance company stating she is going to be charge 780 dollars for anesthesia. Informed patient to Email me the EOB form so I could take a look at it. On 01/08/2020 a stool test was preform but that is it. Informed patient that there is no where in her chart that we billed for a anesthesia. Informed patient that she needed to contact [facility] [employer] or her insurance company to see if they missed billed something. Called [employer] and they state the only thing that was billed that day was the stool test GI profile. Informed patient of this information by leaving a detail message

## 2020-03-18 ENCOUNTER — Ambulatory Visit (INDEPENDENT_AMBULATORY_CARE_PROVIDER_SITE_OTHER): Payer: BC Managed Care – PPO | Admitting: Certified Nurse Midwife

## 2020-03-18 ENCOUNTER — Other Ambulatory Visit: Payer: Self-pay

## 2020-03-18 ENCOUNTER — Encounter: Payer: Self-pay | Admitting: Certified Nurse Midwife

## 2020-03-18 VITALS — BP 113/63 | HR 84 | Ht 63.0 in | Wt 250.4 lb

## 2020-03-18 DIAGNOSIS — Z7689 Persons encountering health services in other specified circumstances: Secondary | ICD-10-CM

## 2020-03-18 DIAGNOSIS — Z713 Dietary counseling and surveillance: Secondary | ICD-10-CM | POA: Diagnosis not present

## 2020-03-18 DIAGNOSIS — E669 Obesity, unspecified: Secondary | ICD-10-CM | POA: Diagnosis not present

## 2020-03-18 DIAGNOSIS — Z6841 Body Mass Index (BMI) 40.0 and over, adult: Secondary | ICD-10-CM

## 2020-03-18 MED ORDER — CYANOCOBALAMIN 1000 MCG/ML IJ SOLN: 1000.0000 ug | Freq: Once | INTRAMUSCULAR | 6 refills | Status: AC

## 2020-03-18 MED ORDER — PHENTERMINE HCL 37.5 MG PO TABS: 37.5000 mg | ORAL_TABLET | Freq: Every day | ORAL | 0 refills | Status: DC

## 2020-03-18 MED ORDER — CYANOCOBALAMIN 1000 MCG/ML IJ SOLN
1000.0000 ug | Freq: Once | INTRAMUSCULAR | Status: AC
  Administered 2020-03-18: 1000 ug via INTRAMUSCULAR

## 2020-03-18 NOTE — Patient Instructions (Signed)
Exercising to Lose Weight Exercise is structured, repetitive physical activity to improve fitness and health. Getting regular exercise is important for everyone. It is especially important if you are overweight. Being overweight increases your risk of heart disease, stroke, diabetes, high blood pressure, and several types of cancer. Reducing your calorie intake and exercising can help you lose weight. Exercise is usually categorized as moderate or vigorous intensity. To lose weight, most people need to do a certain amount of moderate-intensity or vigorous-intensity exercise each week. Moderate-intensity exercise  Moderate-intensity exercise is any activity that gets you moving enough to burn at least three times more energy (calories) than if you were sitting. Examples of moderate exercise include:  Walking a mile in 15 minutes.  Doing light yard work.  Biking at an easy pace. Most people should get at least 150 minutes (2 hours and 30 minutes) a week of moderate-intensity exercise to maintain their body weight. Vigorous-intensity exercise Vigorous-intensity exercise is any activity that gets you moving enough to burn at least six times more calories than if you were sitting. When you exercise at this intensity, you should be working hard enough that you are not able to carry on a conversation. Examples of vigorous exercise include:  Running.  Playing a team sport, such as football, basketball, and soccer.  Jumping rope. Most people should get at least 75 minutes (1 hour and 15 minutes) a week of vigorous-intensity exercise to maintain their body weight. How can exercise affect me? When you exercise enough to burn more calories than you eat, you lose weight. Exercise also reduces body fat and builds muscle. The more muscle you have, the more calories you burn. Exercise also:  Improves mood.  Reduces stress and tension.  Improves your overall fitness, flexibility, and  endurance.  Increases bone strength. The amount of exercise you need to lose weight depends on:  Your age.  The type of exercise.  Any health conditions you have.  Your overall physical ability. Talk to your health care provider about how much exercise you need and what types of activities are safe for you. What actions can I take to lose weight? Nutrition   Make changes to your diet as told by your health care provider or diet and nutrition specialist (dietitian). This may include: ? Eating fewer calories. ? Eating more protein. ? Eating less unhealthy fats. ? Eating a diet that includes fresh fruits and vegetables, whole grains, low-fat dairy products, and lean protein. ? Avoiding foods with added fat, salt, and sugar.  Drink plenty of water while you exercise to prevent dehydration or heat stroke. Activity  Choose an activity that you enjoy and set realistic goals. Your health care provider can help you make an exercise plan that works for you.  Exercise at a moderate or vigorous intensity most days of the week. ? The intensity of exercise may vary from person to person. You can tell how intense a workout is for you by paying attention to your breathing and heartbeat. Most people will notice their breathing and heartbeat get faster with more intense exercise.  Do resistance training twice each week, such as: ? Push-ups. ? Sit-ups. ? Lifting weights. ? Using resistance bands.  Getting short amounts of exercise can be just as helpful as long structured periods of exercise. If you have trouble finding time to exercise, try to include exercise in your daily routine. ? Get up, stretch, and walk around every 30 minutes throughout the day. ? Go for a   walk during your lunch break. ? Park your car farther away from your destination. ? If you take public transportation, get off one stop early and walk the rest of the way. ? Make phone calls while standing up and walking  around. ? Take the stairs instead of elevators or escalators.  Wear comfortable clothes and shoes with good support.  Do not exercise so much that you hurt yourself, feel dizzy, or get very short of breath. Where to find more information  [place] Department of Health and Human Services: www.hhs.gov  Centers for Disease Control and Prevention (CDC): www.cdc.gov Contact a health care provider:  Before starting a new exercise program.  If you have questions or concerns about your weight.  If you have a medical problem that keeps you from exercising. Get help right away if you have any of the following while exercising:  Injury.  Dizziness.  Difficulty breathing or shortness of breath that does not go away when you stop exercising.  Chest pain.  Rapid heartbeat. Summary  Being overweight increases your risk of heart disease, stroke, diabetes, high blood pressure, and several types of cancer.  Losing weight happens when you burn more calories than you eat.  Reducing the amount of calories you eat in addition to getting regular moderate or vigorous exercise each week helps you lose weight. This information is not intended to replace advice given to you by your health care provider. Make sure you discuss any questions you have with your health care provider. Document Revised: 12/11/2017 Document Reviewed: 12/11/2017 Elsevier Patient Education  2020 [employer] Calorie Counting for Weight Loss Calories are units of energy. Your body needs a certain amount of calories from food to keep you going throughout the day. When you eat more calories than your body needs, your body stores the extra calories as fat. When you eat fewer calories than your body needs, your body burns fat to get the energy it needs. Calorie counting means keeping track of how many calories you eat and drink each day. Calorie counting can be helpful if you need to lose weight. If you make sure to eat fewer calories  than your body needs, you should lose weight. Ask your health care provider what a healthy weight is for you. For calorie counting to work, you will need to eat the right number of calories in a day in order to lose a healthy amount of weight per week. A dietitian can help you determine how many calories you need in a day and will give you suggestions on how to reach your calorie goal.  A healthy amount of weight to lose per week is usually 1-2 lb (0.5-0.9 kg). This usually means that your daily calorie intake should be reduced by 500-750 calories.  Eating 1,200 - 1,500 calories per day can help most women lose weight.  Eating 1,500 - 1,800 calories per day can help most men lose weight. What is my plan? My goal is to have __________ calories per day. If I have this many calories per day, I should lose around __________ pounds per week. What do I need to know about calorie counting? In order to meet your daily calorie goal, you will need to:  Find out how many calories are in each food you would like to eat. Try to do this before you eat.  Decide how much of the food you plan to eat.  Write down what you ate and how many calories it had. Doing this   is called keeping a food log. To successfully lose weight, it is important to balance calorie counting with a healthy lifestyle that includes regular activity. Aim for 150 minutes of moderate exercise (such as walking) or 75 minutes of vigorous exercise (such as running) each week. Where do I find calorie information?  The number of calories in a food can be found on a Nutrition Facts label. If a food does not have a Nutrition Facts label, try to look up the calories online or ask your dietitian for help. Remember that calories are listed per serving. If you choose to have more than one serving of a food, you will have to multiply the calories per serving by the amount of servings you plan to eat. For example, the label on a package of bread might  say that a serving size is 1 slice and that there are 90 calories in a serving. If you eat 1 slice, you will have eaten 90 calories. If you eat 2 slices, you will have eaten 180 calories. How do I keep a food log? Immediately after each meal, record the following information in your food log:  What you ate. Don't forget to include toppings, sauces, and other extras on the food.  How much you ate. This can be measured in cups, ounces, or number of items.  How many calories each food and drink had.  The total number of calories in the meal. Keep your food log near you, such as in a small notebook in your pocket, or use a mobile app or website. Some programs will calculate calories for you and show you how many calories you have left for the day to meet your goal. What are some calorie counting tips?   Use your calories on foods and drinks that will fill you up and not leave you hungry: ? Some examples of foods that fill you up are nuts and nut butters, vegetables, lean proteins, and high-fiber foods like whole grains. High-fiber foods are foods with more than 5 g fiber per serving. ? Drinks such as sodas, specialty coffee drinks, alcohol, and juices have a lot of calories, yet do not fill you up.  Eat nutritious foods and avoid empty calories. Empty calories are calories you get from foods or beverages that do not have many vitamins or protein, such as candy, sweets, and soda. It is better to have a nutritious high-calorie food (such as an avocado) than a food with few nutrients (such as a bag of chips).  Know how many calories are in the foods you eat most often. This will help you calculate calorie counts faster.  Pay attention to calories in drinks. Low-calorie drinks include water and unsweetened drinks.  Pay attention to nutrition labels for "low fat" or "fat free" foods. These foods sometimes have the same amount of calories or more calories than the full fat versions. They also often  have added sugar, starch, or salt, to make up for flavor that was removed with the fat.  Find a way of tracking calories that works for you. Get creative. Try different apps or programs if writing down calories does not work for you. What are some portion control tips?  Know how many calories are in a serving. This will help you know how many servings of a certain food you can have.  Use a measuring cup to measure serving sizes. You could also try weighing out portions on a kitchen scale. With time, you will be able   to estimate serving sizes for some foods.  Take some time to put servings of different foods on your favorite plates, bowls, and cups so you know what a serving looks like.  Try not to eat straight from a bag or box. Doing this can lead to overeating. Put the amount you would like to eat in a cup or on a plate to make sure you are eating the right portion.  Use smaller plates, glasses, and bowls to prevent overeating.  Try not to multitask (for example, watch TV or use your computer) while eating. If it is time to eat, sit down at a table and enjoy your food. This will help you to know when you are full. It will also help you to be aware of what you are eating and how much you are eating. What are tips for following this plan? Reading food labels  Check the calorie count compared to the serving size. The serving size may be smaller than what you are used to eating.  Check the source of the calories. Make sure the food you are eating is high in vitamins and protein and low in saturated and trans fats. Shopping  Read nutrition labels while you shop. This will help you make healthy decisions before you decide to purchase your food.  Make a grocery list and stick to it. Cooking  Try to cook your favorite foods in a healthier way. For example, try baking instead of frying.  Use low-fat dairy products. Meal planning  Use more fruits and vegetables. Half of your plate should be  fruits and vegetables.  Include lean proteins like poultry and fish. How do I count calories when eating out?  Ask for smaller portion sizes.  Consider sharing an entree and sides instead of getting your own entree.  If you get your own entree, eat only half. Ask for a box at the beginning of your meal and put the rest of your entree in it so you are not tempted to eat it.  If calories are listed on the menu, choose the lower calorie options.  Choose dishes that include vegetables, fruits, whole grains, low-fat dairy products, and lean protein.  Choose items that are boiled, broiled, grilled, or steamed. Stay away from items that are buttered, battered, fried, or served with cream sauce. Items labeled "crispy" are usually fried, unless stated otherwise.  Choose water, low-fat milk, unsweetened iced tea, or other drinks without added sugar. If you want an alcoholic beverage, choose a lower calorie option such as a glass of wine or light beer.  Ask for dressings, sauces, and syrups on the side. These are usually high in calories, so you should limit the amount you eat.  If you want a salad, choose a garden salad and ask for grilled meats. Avoid extra toppings like bacon, cheese, or fried items. Ask for the dressing on the side, or ask for olive oil and vinegar or lemon to use as dressing.  Estimate how many servings of a food you are given. For example, a serving of cooked rice is  cup or about the size of half a baseball. Knowing serving sizes will help you be aware of how much food you are eating at restaurants. The list below tells you how big or small some common portion sizes are based on everyday objects: ? 1 oz--4 stacked dice. ? 3 oz--1 deck of cards. ? 1 tsp--1 die. ? 1 Tbsp-- a ping-pong ball. ? 2 Tbsp--1 ping-pong ball. ?    cup-- baseball. ? 1 cup--1 baseball. Summary  Calorie counting means keeping track of how many calories you eat and drink each day. If you eat fewer  calories than your body needs, you should lose weight.  A healthy amount of weight to lose per week is usually 1-2 lb (0.5-0.9 kg). This usually means reducing your daily calorie intake by 500-750 calories.  The number of calories in a food can be found on a Nutrition Facts label. If a food does not have a Nutrition Facts label, try to look up the calories online or ask your dietitian for help.  Use your calories on foods and drinks that will fill you up, and not on foods and drinks that will leave you hungry.  Use smaller plates, glasses, and bowls to prevent overeating. This information is not intended to replace advice given to you by your health care provider. Make sure you discuss any questions you have with your health care provider. Document Revised: 08/17/2018 Document Reviewed: 10/28/2016 Elsevier Patient Education  2020 Elsevier Inc.  

## 2020-03-18 NOTE — Progress Notes (Signed)
Controlled substance contract explained and signed. Waist circumference=54"

## 2020-03-18 NOTE — Progress Notes (Signed)
Subjective:  Cindy Pena is a 49 y.o. being seen today for weight loss management- initial visit.  Patient reports General ROS: negative and reports previous weight loss attempts: on and off her whole life Management changes made at the last visit include:  Diet and exercise  Onset was gradual. Onset followed: years of little to no exercise and poor eating habbits Associated symptoms include: fatigue, depression, anxiety, loose stools, consitpation , headaches, change in clothing fit. Previous/Current treatment includes: Low carb diet vitamin B-12 injections and appetite stimulant.  Pertinent medical history includes: was seeing GI for diarrhea and constipation, which has now resolved.  Risk factors include: pt state she is "lazy" has difficulty working out.  Pertinent social history includes: high blood pressure managed well on medications. Past evaluation has included: metabolic profile, hemoglobin A1c, thyroid panel. Past treatment has included: she has never used medications or followed weight loss plan in the past.  The following portions of the patient's history were reviewed and updated as appropriate: allergies, current medications, past family history, past medical history, past social history, past surgical history and problem list.   Objective:   Vitals:   03/18/20 1324  BP: 113/63  Pulse: 84  Weight: 250 lb 7 oz (113.6 kg)  Height: 5\' 3"  (1.6 m)  Waist 54 in   General:  Alert, oriented and cooperative. Patient is in no acute distress.  PE: Well groomed female in no current distress,   Mental Status: Normal mood and affect. Normal behavior. Normal judgment and thought content.   Current BMI: Body mass index is 44.36 kg/m.   Assessment and Plan:  Obesity  1. Encounter for weight management  Plan: low carb, High protein diet RX for adipex 37.5 mg daily and B12 .ml monthly, to start now with first injection given at today's visit. Reviewed  side-effects common to both medications and expected outcomes. Discussed wt loss of 5% in 3 months (12.5 lbs) in order to stay on medication. Discussed intended for short term use - up to 1 yr if responding well to medications.   Increase daily water intake to at least 8 bottle a day, every day.  Goal is to reduse weight by 10% by end of three months, and will re-evaluate then.  Given her hx of hypertension she will follow up 1 wk for BP check, she will stop medicaton immediately if she has any chest pain, shortness of breath, palpitations or negative side effects.   RTC in 4 weeks for check weight & BP, and get next B12 injections.  Pt signed controlled substance contract.   Please refer to After Visit Summary for other counseling recommendations.  I attest more than 50% of visit spent discussing history, discussing diet and exercise, use of medication and developing a plan Answering all questions.. Face to face time 17 min  , CNM         Consider the Low Glycemic Index Diet and 6 smaller meals daily .  This boosts your metabolism and regulates your sugars:   Use the protein bar by Atkins because they have lots of fiber in them  Find the low carb flatbreads, tortillas and pita breads for sandwiches:  Joseph's makes a pita bread and a flat bread , available at [facility] and [employer]; Toufayah makes a low carb flatbread available at [facility] and HT that is 9 net carbs and 100 cal [place] makes a low carb whole wheat tortilla available at [employer] most grocery stores with 6  net carbs and 210 cal  [place] yogurt can still have a lot of carbs .  Dannon Light N fit has 80 cal and 8 carbs

## 2020-03-25 ENCOUNTER — Ambulatory Visit: Admit: 2020-03-25 | Payer: BC Managed Care – PPO | Admitting: Gastroenterology

## 2020-03-25 SURGERY — COLONOSCOPY WITH PROPOFOL
Anesthesia: General

## 2020-03-27 ENCOUNTER — Ambulatory Visit (INDEPENDENT_AMBULATORY_CARE_PROVIDER_SITE_OTHER): Payer: BC Managed Care – PPO | Admitting: Certified Nurse Midwife

## 2020-03-27 ENCOUNTER — Other Ambulatory Visit: Payer: Self-pay

## 2020-03-27 DIAGNOSIS — I10 Essential (primary) hypertension: Secondary | ICD-10-CM

## 2020-04-14 ENCOUNTER — Other Ambulatory Visit: Payer: Self-pay | Admitting: Certified Nurse Midwife

## 2020-04-15 ENCOUNTER — Other Ambulatory Visit: Payer: Self-pay | Admitting: Certified Nurse Midwife

## 2020-04-15 MED ORDER — PHENTERMINE HCL 37.5 MG PO TABS: 37.5000 mg | ORAL_TABLET | Freq: Every day | ORAL | 0 refills | Status: DC

## 2020-04-20 ENCOUNTER — Encounter: Payer: BC Managed Care – PPO | Admitting: Certified Nurse Midwife

## 2020-04-27 ENCOUNTER — Other Ambulatory Visit: Payer: Self-pay

## 2020-04-27 DIAGNOSIS — K5904 Chronic idiopathic constipation: Secondary | ICD-10-CM

## 2020-04-27 MED ORDER — LINACLOTIDE 145 MCG PO CAPS: 145.0000 ug | ORAL_CAPSULE | Freq: Every day | ORAL | 2 refills | Status: DC

## 2020-04-27 NOTE — Telephone Encounter (Signed)
Last office visit 03/03/2021constipation  Last refill 01/27/2020 2 refills

## 2020-04-28 ENCOUNTER — Encounter: Payer: Self-pay | Admitting: Certified Nurse Midwife

## 2020-04-28 ENCOUNTER — Other Ambulatory Visit: Payer: Self-pay

## 2020-04-28 ENCOUNTER — Ambulatory Visit (INDEPENDENT_AMBULATORY_CARE_PROVIDER_SITE_OTHER): Payer: BC Managed Care – PPO | Admitting: Certified Nurse Midwife

## 2020-04-28 VITALS — BP 113/61 | HR 65 | Ht 63.0 in | Wt 236.3 lb

## 2020-04-28 DIAGNOSIS — Z6841 Body Mass Index (BMI) 40.0 and over, adult: Secondary | ICD-10-CM | POA: Diagnosis not present

## 2020-04-28 DIAGNOSIS — E668 Other obesity: Secondary | ICD-10-CM

## 2020-04-28 DIAGNOSIS — Z7689 Persons encountering health services in other specified circumstances: Secondary | ICD-10-CM

## 2020-04-28 MED ORDER — PHENTERMINE HCL 37.5 MG PO TABS: 37.5000 mg | ORAL_TABLET | Freq: Every day | ORAL | 0 refills | Status: DC

## 2020-04-28 MED ORDER — CYANOCOBALAMIN 1000 MCG/ML IJ SOLN
1000.0000 ug | Freq: Once | INTRAMUSCULAR | Status: AC
  Administered 2020-04-28: 1000 ug via INTRAMUSCULAR

## 2020-04-28 NOTE — Patient Instructions (Signed)

## 2020-04-28 NOTE — Progress Notes (Signed)
SUBJECTIVE:  49 y.o. here for follow-up weight loss visit, previously seen 4 weeks ago. Denies any concerns and feels like medication is working well. She has lost 14 lbs over the past month. She is following a keto diet, she eats an atkins bar if wanting something sweet., she drinks one diet green tea daily and water. She is exercising on her recumbant bike 20 min 5 x wk.   OBJECTIVE:  BP 113/61   Pulse 65   Ht 5\' 3"  (1.6 m)   Wt 236 lb 5 oz (107.2 kg)   BMI 41.86 kg/m   Body mass index is 41.86 kg/m.  Waist: 52 in Patient appears well. ASSESSMENT:  Obesity- responding well to weight loss plan PLAN:  To continue with current medications. B12 1044mcg/ml injection given RTC in 4 weeks as planned  80m, CNM

## 2020-05-14 NOTE — Telephone Encounter (Signed)
No ma'am. She can wait until her next appointment with Pattricia Boss. Thanks, JML

## 2020-05-15 ENCOUNTER — Other Ambulatory Visit: Payer: Self-pay | Admitting: Certified Nurse Midwife

## 2020-05-20 ENCOUNTER — Other Ambulatory Visit: Payer: Self-pay | Admitting: Certified Nurse Midwife

## 2020-05-20 ENCOUNTER — Other Ambulatory Visit: Payer: Self-pay

## 2020-05-20 MED ORDER — PHENTERMINE HCL 37.5 MG PO TABS: 37.5000 mg | ORAL_TABLET | Freq: Every day | ORAL | 0 refills | Status: DC

## 2020-05-20 NOTE — Addendum Note (Signed)
Addended by: Brooke Dare on: 05/20/2020 12:40 PM   Modules accepted: Orders

## 2020-05-20 NOTE — Telephone Encounter (Signed)
Phentermine script printed and faxed due to thumb print machine not working.

## 2020-05-26 ENCOUNTER — Encounter: Payer: BC Managed Care – PPO | Admitting: Certified Nurse Midwife

## 2020-05-27 ENCOUNTER — Telehealth: Payer: Self-pay

## 2020-05-27 ENCOUNTER — Encounter: Payer: Self-pay | Admitting: Certified Nurse Midwife

## 2020-05-27 ENCOUNTER — Other Ambulatory Visit: Payer: Self-pay

## 2020-05-27 ENCOUNTER — Ambulatory Visit (INDEPENDENT_AMBULATORY_CARE_PROVIDER_SITE_OTHER): Payer: BC Managed Care – PPO | Admitting: Certified Nurse Midwife

## 2020-05-27 VITALS — BP 107/71 | HR 85 | Ht 63.0 in | Wt 228.0 lb

## 2020-05-27 DIAGNOSIS — Z7689 Persons encountering health services in other specified circumstances: Secondary | ICD-10-CM | POA: Diagnosis not present

## 2020-05-27 DIAGNOSIS — Z6841 Body Mass Index (BMI) 40.0 and over, adult: Secondary | ICD-10-CM

## 2020-05-27 MED ORDER — PHENTERMINE HCL 37.5 MG PO TABS: 37.5000 mg | ORAL_TABLET | Freq: Every day | ORAL | 0 refills | Status: DC

## 2020-05-27 MED ORDER — CYANOCOBALAMIN 1000 MCG/ML IJ SOLN
1000.0000 ug | Freq: Once | INTRAMUSCULAR | Status: AC
  Administered 2020-05-27: 1000 ug via INTRAMUSCULAR

## 2020-05-27 NOTE — Telephone Encounter (Signed)
mychart message sent to patient

## 2020-05-27 NOTE — Progress Notes (Signed)
WC=51" 

## 2020-05-27 NOTE — Patient Instructions (Signed)

## 2020-05-27 NOTE — Progress Notes (Signed)
SUBJECTIVE:  49 y.o. here for follow-up weight loss visit, previously seen 4 weeks ago. Denies any concerns and feels like medication is doing well. Medication well. She has lost 7 lbs . Denies any complication or side effects from Medication.   OBJECTIVE:  BP 107/71   Pulse 85   Ht 5\' 3"  (1.6 m)   Wt 228 lb (103.4 kg)   BMI 40.39 kg/m   Body mass index is 40.39 kg/m.  Waist 51  Patient appears well. ASSESSMENT:  Obesity- responding well to weight loss plan PLAN:  To continue with current medications. B12 1046mcg/ml injection given RTC in 4 weeks as planned  PMP aware web site reviewed. Has history of one narcotic 07/2019 no longer on . Only other medication noted is phentermine.   08/2019, CNM

## 2020-06-17 ENCOUNTER — Encounter: Payer: Self-pay | Admitting: Internal Medicine

## 2020-06-24 ENCOUNTER — Ambulatory Visit (INDEPENDENT_AMBULATORY_CARE_PROVIDER_SITE_OTHER): Payer: BC Managed Care – PPO | Admitting: Certified Nurse Midwife

## 2020-06-24 ENCOUNTER — Encounter: Payer: Self-pay | Admitting: Certified Nurse Midwife

## 2020-06-24 ENCOUNTER — Other Ambulatory Visit: Payer: Self-pay

## 2020-06-24 DIAGNOSIS — Z6841 Body Mass Index (BMI) 40.0 and over, adult: Secondary | ICD-10-CM | POA: Diagnosis not present

## 2020-06-24 MED ORDER — CYANOCOBALAMIN 1000 MCG/ML IJ SOLN
1000.0000 ug | Freq: Once | INTRAMUSCULAR | Status: AC
  Administered 2020-06-24: 1000 ug via INTRAMUSCULAR

## 2020-06-24 MED ORDER — PHENTERMINE HCL 37.5 MG PO TABS: 37.5000 mg | ORAL_TABLET | Freq: Every day | ORAL | 0 refills | Status: DC

## 2020-06-24 NOTE — Patient Instructions (Signed)

## 2020-06-24 NOTE — Progress Notes (Signed)
WC 49.5"

## 2020-06-24 NOTE — Progress Notes (Signed)
SUBJECTIVE:  49 y.o. here for follow-up weight loss visit, previously seen 4 weeks ago. Denies any concerns and feels like medication is working well. She denies any negative side effects from medication. She has lost 8 lbs since her last appointment.   OBJECTIVE:  BP 111/73   Pulse 93   Ht 5\' 3"  (1.6 m)   Wt 220 lb (99.8 kg)   BMI 38.97 kg/m   Body mass index is 38.97 kg/m.  Waist 49.5 inches Patient appears well. ASSESSMENT:  Obesity- responding well to weight loss plan PLAN:  To continue with current medications. B12 1068mcg/ml injection given RTC in 4 weeks as planned  80m, CNM

## 2020-07-22 ENCOUNTER — Encounter: Payer: BC Managed Care – PPO | Admitting: Certified Nurse Midwife

## 2020-07-24 ENCOUNTER — Other Ambulatory Visit: Payer: Self-pay

## 2020-07-24 ENCOUNTER — Other Ambulatory Visit: Payer: Self-pay | Admitting: Internal Medicine

## 2020-07-24 DIAGNOSIS — K5904 Chronic idiopathic constipation: Secondary | ICD-10-CM

## 2020-07-24 MED ORDER — LINACLOTIDE 145 MCG PO CAPS: 145.0000 ug | ORAL_CAPSULE | Freq: Every day | ORAL | 2 refills | Status: DC

## 2020-07-24 NOTE — Telephone Encounter (Signed)
Last office visit 02/12/2020 constipation  Last refill 04/27/2020 2 refills

## 2020-08-07 ENCOUNTER — Other Ambulatory Visit: Payer: Self-pay | Admitting: Internal Medicine

## 2020-08-07 DIAGNOSIS — E282 Polycystic ovarian syndrome: Secondary | ICD-10-CM

## 2020-08-07 DIAGNOSIS — E034 Atrophy of thyroid (acquired): Secondary | ICD-10-CM

## 2020-08-07 NOTE — Telephone Encounter (Signed)
Requested Prescriptions  Pending Prescriptions Disp Refills  . levothyroxine (SYNTHROID) 125 MCG tablet [Pharmacy Med Name: LEVOTHYROXINE 0.125MG  (125MCG) TAB] 90 tablet 1    Sig: TAKE 1 TABLET(125 MCG) BY MOUTH DAILY BEFORE BREAKFAST     Endocrinology:  Hypothyroid Agents Failed - 08/07/2020  4:37 PM      Failed - TSH needs to be rechecked within 3 months after an abnormal result. Refill until TSH is due.      Passed - TSH in normal range and within 360 days    TSH  Date Value Ref Range Status  02/07/2020 0.834 0.450 - 4.500 uIU/mL Final         Passed - Valid encounter within last 12 months    Recent Outpatient Visits          6 months ago Annual physical exam   [facility] Glean Hess, MD   1 year ago Essential hypertension   [facility] Glean Hess, MD   1 year ago [facility] Glean Hess, MD   2 years ago Acquired hypothyroidism   [facility] Glean Hess, MD   2 years ago [facility] Glean Hess, MD      Future Appointments            In 4 weeks Glean Hess, MD [facility], [place]   In 6 months Glean Hess, MD [facility], [place]           . metFORMIN (GLUCOPHAGE) 500 MG tablet [Pharmacy Med Name: METFORMIN 500MG  TABLETS] 90 tablet 0    Sig: TAKE 1 TABLET(500 MG) BY MOUTH DAILY WITH BREAKFAST     Endocrinology:  Diabetes - Biguanides Failed - 08/07/2020  4:37 PM      Failed - HBA1C is between 0 and 7.9 and within 180 days    Hgb A1c MFr Bld  Date Value Ref Range Status  02/07/2020 5.2 4.8 - 5.6 % Final    Comment:             Prediabetes: 5.7 - 6.4          Diabetes: >6.4          Glycemic control for adults with diabetes: <7.0          Failed - Valid encounter within last 6 months    Recent Outpatient Visits          6 months ago Annual physical exam   [facility] Glean Hess, MD   1 year ago Essential  hypertension   [facility] Glean Hess, MD   1 year ago [facility] Glean Hess, MD   2 years ago Acquired hypothyroidism   [facility] Glean Hess, MD   2 years ago [facility] Glean Hess, MD      Future Appointments            In 4 weeks [facility] Melia Jesse Sans, MD [facility], [place]   In 6 months Glean Hess, MD [facility] [place] in normal range and within 360 days    Creatinine, Ser  Date Value Ref Range Status  02/07/2020 0.90 0.57 - 1.00 mg/dL Final         Passed - eGFR in normal  range and within 360 days    GFR calc Af Amer  Date Value Ref Range Status  02/07/2020 87 >59 mL/min/1.73 Final   GFR calc non Af Amer  Date Value Ref Range Status  02/07/2020 75 >59 mL/min/1.73 Final

## 2020-08-17 ENCOUNTER — Other Ambulatory Visit: Payer: Self-pay | Admitting: Internal Medicine

## 2020-08-17 DIAGNOSIS — K219 Gastro-esophageal reflux disease without esophagitis: Secondary | ICD-10-CM

## 2020-08-27 ENCOUNTER — Encounter: Payer: Self-pay | Admitting: Internal Medicine

## 2020-09-04 ENCOUNTER — Ambulatory Visit: Payer: Self-pay | Admitting: Internal Medicine

## 2020-10-14 ENCOUNTER — Ambulatory Visit: Payer: BC Managed Care – PPO | Admitting: Internal Medicine

## 2020-10-16 ENCOUNTER — Other Ambulatory Visit: Payer: Self-pay | Admitting: Gastroenterology

## 2020-10-16 ENCOUNTER — Other Ambulatory Visit: Payer: Self-pay | Admitting: Internal Medicine

## 2020-10-16 DIAGNOSIS — K5904 Chronic idiopathic constipation: Secondary | ICD-10-CM

## 2020-10-21 ENCOUNTER — Ambulatory Visit (INDEPENDENT_AMBULATORY_CARE_PROVIDER_SITE_OTHER): Payer: BC Managed Care – PPO | Admitting: Internal Medicine

## 2020-10-21 ENCOUNTER — Other Ambulatory Visit: Payer: Self-pay

## 2020-10-21 ENCOUNTER — Encounter: Payer: Self-pay | Admitting: Internal Medicine

## 2020-10-21 VITALS — BP 116/70 | HR 79 | Temp 97.9°F | Ht 63.0 in | Wt 204.0 lb

## 2020-10-21 DIAGNOSIS — Z1231 Encounter for screening mammogram for malignant neoplasm of breast: Secondary | ICD-10-CM

## 2020-10-21 DIAGNOSIS — E034 Atrophy of thyroid (acquired): Secondary | ICD-10-CM | POA: Diagnosis not present

## 2020-10-21 DIAGNOSIS — E282 Polycystic ovarian syndrome: Secondary | ICD-10-CM | POA: Diagnosis not present

## 2020-10-21 DIAGNOSIS — I10 Essential (primary) hypertension: Secondary | ICD-10-CM | POA: Diagnosis not present

## 2020-10-21 DIAGNOSIS — R002 Palpitations: Secondary | ICD-10-CM | POA: Diagnosis not present

## 2020-10-21 MED ORDER — METOPROLOL TARTRATE 25 MG PO TABS: 12.5000 mg | ORAL_TABLET | Freq: Every day | ORAL | 0 refills | Status: DC | PRN

## 2020-10-21 MED ORDER — METFORMIN HCL 500 MG PO TABS: 500.0000 mg | ORAL_TABLET | Freq: Every day | ORAL | 1 refills | Status: DC

## 2020-10-21 NOTE — Progress Notes (Signed)
Date:  10/21/2020   Name:  Cindy Pena   DOB:  November 10, 1971   MRN:  [id number]   Chief Complaint: Hypertension (Follow up.) and Diabetes  Hypertension This is a chronic problem. The problem is controlled. Pertinent negatives include no chest pain, headaches, palpitations or shortness of breath. Past treatments include ACE inhibitors. The current treatment provides significant improvement. Identifiable causes of hypertension include a thyroid problem.  Thyroid Problem Presents for follow-up visit. Symptoms include anxiety. Patient reports no constipation, diarrhea, fatigue, palpitations or tremors. The symptoms have been stable.  Palpitations  This is a recurrent problem. The current episode started 1 to 4 weeks ago. The problem occurs daily. The problem has been unchanged. Nothing aggravates the symptoms. Associated symptoms include anxiety. Pertinent negatives include no chest pain, coughing, fever, numbness or shortness of breath. She has tried nothing for the symptoms.    Lab Results  Component Value Date   CREATININE 0.90 02/07/2020   BUN 16 02/07/2020   NA 141 02/07/2020   K 4.3 02/07/2020   CL 103 02/07/2020   CO2 22 02/07/2020   Lab Results  Component Value Date   CHOL 219 (H) 02/07/2020   HDL 52 02/07/2020   LDLCALC 141 (H) 02/07/2020   TRIG 144 02/07/2020   CHOLHDL 4.2 02/07/2020   Lab Results  Component Value Date   TSH 0.834 02/07/2020   Lab Results  Component Value Date   HGBA1C 5.2 02/07/2020   Lab Results  Component Value Date   WBC 7.0 12/26/2019   HGB 14.6 12/26/2019   HCT 44.2 12/26/2019   MCV 82.3 12/26/2019   PLT 155 12/26/2019   Lab Results  Component Value Date   ALT 70 (H) 02/07/2020   AST 59 (H) 02/07/2020   ALKPHOS 136 (H) 02/07/2020   BILITOT 0.5 02/07/2020     Review of Systems  Constitutional: Negative for appetite change, fatigue, fever and unexpected weight change.  HENT: Negative for trouble swallowing.   Eyes:  Negative for visual disturbance.  Respiratory: Negative for cough, chest tightness and shortness of breath.   Cardiovascular: Negative for chest pain, palpitations and leg swelling.  Gastrointestinal: Negative for abdominal pain, constipation and diarrhea.  Genitourinary: Negative for dysuria and hematuria.  Musculoskeletal: Negative for arthralgias.  Neurological: Negative for tremors, numbness and headaches.  Psychiatric/Behavioral: Negative for dysphoric mood. The patient is nervous/anxious.     Patient Active Problem List   Diagnosis Date Noted  . Hepatic steatosis 02/20/2019  . Mild hyperlipidemia 02/02/2019  . Hypothyroidism due to acquired atrophy of thyroid 02/01/2019  . Essential hypertension 05/21/2018  . Rash 05/21/2018  . Eczema 10/20/2017  . Morbid obesity with BMI of 40.0-44.9, adult (HCC) 04/19/2016  . Gastroesophageal reflux disease without esophagitis 07/08/2015  . PCOS (polycystic ovarian syndrome) 07/08/2015    Allergies  Allergen Reactions  . Darvon [Propoxyphene] Other (See Comments)    Chest Pain.  Marland Kitchen Penicillins Other (See Comments)    Migraines.  . Tramadol Hcl Rash  . Other Other (See Comments)    Past Surgical History:  Procedure Laterality Date  . c section  1996   September- SON Birth  . CHOLECYSTECTOMY  2003  . DILATION AND CURETTAGE OF UTERUS  1996 & 2006  . ooph Right 1995   Right tube removed- mass was on tube.  Marland Kitchen TOTAL ABDOMINAL HYSTERECTOMY  2009   Right ovary attached to center of stomach.    Social History   Tobacco Use  . Smoking  status: Never Smoker  . Smokeless tobacco: Never Used  Vaping Use  . Vaping Use: Never used  Substance Use Topics  . Alcohol use: No  . Drug use: No     Medication list has been reviewed and updated.  Current Meds  Medication Sig  . docusate sodium (COLACE) 100 MG capsule Take 100 mg by mouth 2 (two) times daily.  Marland Kitchen levothyroxine (SYNTHROID) 125 MCG tablet TAKE 1 TABLET(125 MCG) BY MOUTH DAILY  BEFORE BREAKFAST  . LINZESS 145 MCG CAPS capsule TAKE 1 CAPSULE(145 MCG) BY MOUTH DAILY BEFORE BREAKFAST  . lisinopril (ZESTRIL) 10 MG tablet TAKE 1 TABLET(10 MG) BY MOUTH DAILY  . metFORMIN (GLUCOPHAGE) 500 MG tablet TAKE 1 TABLET(500 MG) BY MOUTH DAILY WITH BREAKFAST  . Misc Natural Products (ESTROVEN + ENERGY MAX STRENGTH PO) Take by mouth daily.  Marland Kitchen omeprazole (PRILOSEC) 20 MG capsule TAKE 1 CAPSULE(20 MG) BY MOUTH DAILY    PHQ 2/9 Scores 10/21/2020 02/07/2020 02/01/2019 12/25/2018  PHQ - 2 Score 0 0 1 0  PHQ- 9 Score 0 0 - -    GAD 7 : Generalized Anxiety Score 10/21/2020 02/07/2020  Nervous, Anxious, on Edge 2 2  Control/stop worrying 0 0  Worry too much - different things 0 0  Trouble relaxing 0 0  Restless 0 2  Easily annoyed or irritable 0 0  Afraid - awful might happen 0 2  Total GAD 7 Score 2 6  Anxiety Difficulty Not difficult at all Very difficult    BP Readings from Last 3 Encounters:  10/21/20 116/70  06/24/20 111/73  05/27/20 107/71    Physical Exam Vitals and nursing note reviewed.  Constitutional:      General: She is not in acute distress.    Appearance: She is well-developed. She is obese.  HENT:     Head: Normocephalic and atraumatic.  Neck:     Vascular: No carotid bruit.  Cardiovascular:     Rate and Rhythm: Normal rate and regular rhythm. Occasional extrasystoles are present.    Pulses: Normal pulses.     Heart sounds: Normal heart sounds. No murmur heard.   Pulmonary:     Effort: Pulmonary effort is normal. No respiratory distress.     Breath sounds: No wheezing or rhonchi.  Musculoskeletal:     Cervical back: Normal range of motion.     Right lower leg: No edema.     Left lower leg: No edema.  Lymphadenopathy:     Cervical: No cervical adenopathy.  Skin:    General: Skin is warm and dry.     Capillary Refill: Capillary refill takes less than 2 seconds.     Findings: No rash.  Neurological:     General: No focal deficit present.      Mental Status: She is alert and oriented to person, place, and time.  Psychiatric:        Mood and Affect: Mood normal.     Wt Readings from Last 3 Encounters:  10/21/20 204 lb (92.5 kg)  06/24/20 220 lb (99.8 kg)  05/27/20 228 lb (103.4 kg)    BP 116/70   Pulse 79   Temp 97.9 F (36.6 C) (Oral)   Ht 5\' 3"  (1.6 m)   Wt 204 lb (92.5 kg)   SpO2 98%   BMI 36.14 kg/m   Assessment and Plan: 1. Essential hypertension Clinically stable exam with well controlled BP on lisinopril. Tolerating medications without side effects at this time. Pt to continue current  regimen and low sodium diet; benefits of regular exercise as able discussed.  2. Heart palpitations Likely contributing or causing the sensation of anxiety Will try low dose metoprolol. - EKG 12-Lead - NSR @ 75, WNL  3. PCOS (polycystic ovarian syndrome) Continue metformin Recent weight loss with phentermine - metFORMIN (GLUCOPHAGE) 500 MG tablet; Take 1 tablet (500 mg total) by mouth daily with breakfast.  Dispense: 90 tablet; Refill: 1  4. Hypothyroidism due to acquired atrophy of thyroid supplemented  5. Encounter for screening mammogram for breast cancer Due for mammogram in February - MM 3D SCREEN BREAST BILATERAL; Future   Partially dictated using Animal nutritionist. Any errors are unintentional.  Bari Edward, MD [facility]  10/21/2020

## 2020-11-08 ENCOUNTER — Encounter: Payer: Self-pay | Admitting: Internal Medicine

## 2020-11-13 ENCOUNTER — Encounter: Payer: Self-pay | Admitting: Gastroenterology

## 2020-11-13 DIAGNOSIS — K5904 Chronic idiopathic constipation: Secondary | ICD-10-CM

## 2020-11-16 MED ORDER — LINACLOTIDE 145 MCG PO CAPS: ORAL_CAPSULE | ORAL | 1 refills | Status: DC

## 2020-11-16 NOTE — Telephone Encounter (Signed)
Called and left a detail message explaining to patient that we had a copay card she could use. Sent a 90 day supply to the pharmacy. Put in the copay information to the pharmacy. Put copay card upfront to pick up

## 2020-12-07 ENCOUNTER — Ambulatory Visit
Admission: EM | Admit: 2020-12-07 | Discharge: 2020-12-07 | Disposition: A | Discharge status: Home / Self Care | Payer: BC Managed Care – PPO | Attending: Emergency Medicine | Admitting: Emergency Medicine

## 2020-12-07 ENCOUNTER — Other Ambulatory Visit: Payer: Self-pay

## 2020-12-07 DIAGNOSIS — S161XXA Strain of muscle, fascia and tendon at neck level, initial encounter: Secondary | ICD-10-CM

## 2020-12-07 DIAGNOSIS — S29019A Strain of muscle and tendon of unspecified wall of thorax, initial encounter: Secondary | ICD-10-CM | POA: Diagnosis not present

## 2020-12-07 MED ORDER — METHOCARBAMOL 500 MG PO TABS: 500.0000 mg | ORAL_TABLET | Freq: Two times a day (BID) | ORAL | 0 refills | Status: DC

## 2020-12-07 MED ORDER — IBUPROFEN 600 MG PO TABS: 600.0000 mg | ORAL_TABLET | Freq: Four times a day (QID) | ORAL | 0 refills | Status: DC | PRN
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
## 2020-12-07 NOTE — Discharge Instructions (Addendum)
Take the 600 mg ibuprofen and to the 1000 mg methocarbamol on an every 6 hour schedule for the next 2 days.  After that you can use both of them on an as-needed basis.  Apply moist heat to your neck and upper back for 20 minutes at a time 2-3 times a day.  Follow the neck range of motion exercises given to you at discharge twice daily.  If your symptoms are not better in 7 to 10 days follow-up with your primary care provider.

## 2020-12-11 ENCOUNTER — Telehealth: Payer: BC Managed Care – PPO | Admitting: Physician Assistant

## 2020-12-11 DIAGNOSIS — M549 Dorsalgia, unspecified: Secondary | ICD-10-CM

## 2020-12-11 MED ORDER — IBUPROFEN 800 MG PO TABS: 800.0000 mg | ORAL_TABLET | Freq: Three times a day (TID) | ORAL | 0 refills | Status: AC

## 2020-12-11 MED ORDER — TIZANIDINE HCL 2 MG PO TABS: 4.0000 mg | ORAL_TABLET | Freq: Three times a day (TID) | ORAL | 0 refills | Status: AC | PRN

## 2020-12-11 NOTE — Progress Notes (Signed)
We are sorry that you are not feeling well.  Here is how we plan to help!  I am so sorry you are still hurting.  I reviewed the urgent care note.  You are doing all the right things, but I am going to change your therapy to see if you can get some better relief.    Here is what I recommend:  Ibuprofen 800mg  3x per day with food alternating with Tylenol 1000mg  4x per day (do not exceed 4000mg  in 24 hours).  I am going to change your muscle relaxer to Tizanidine.  I would recommend warm bath/shower several times per day and continuing the use of your heating pad and gentle stretches.  This muscle spasm and pain will take time to heal.  I suspect you'll have pain for at least a week. I wish there was a magic cure of this, but unfortunately healing takes time.  Based on what you have shared with me it looks like you mostly have acute back pain.  Acute back pain is defined as musculoskeletal pain that can resolve in 1-3 weeks with conservative treatment.  I have prescribed Ibuprofen 800mg  3x per day WITH FOOD - non-steroid anti-inflammatory (NSAID) as well as Tizanidine 4 mg every eight hours as needed which is a muscle relaxer  Some patients experience stomach irritation or in increased heartburn with anti-inflammatory drugs.  Please keep in mind that muscle relaxer's can cause fatigue and should not be taken while at work or driving.  Back pain is very common.  The pain often gets better over time.  The cause of back pain is usually not dangerous.  Most people can learn to manage their back pain on their own.  Home Care  Stay active.  Start with short walks on flat ground if you can.  Try to walk farther each day.  Do not sit, drive or stand in one place for more than 30 minutes.  Do not stay in bed.  Do not avoid exercise or work.  Activity can help your back heal faster.  Be careful when you bend or lift an object.  Bend at your knees, keep the object close to you, and do not twist.  Sleep on  a firm mattress.  Lie on your side, and bend your knees.  If you lie on your back, put a pillow under your knees.  Only take medicines as told by your doctor.  Put ice on the injured area.  Put ice in a plastic bag  Place a towel between your skin and the bag  Leave the ice on for 15-20 minutes, 3-4 times a day for the first 2-3 days. 210 After that, you can switch between ice and heat packs.  Ask your doctor about back exercises or massage.  Avoid feeling anxious or stressed.  Find good ways to deal with stress, such as exercise.  Get Help Right Way If:  Your pain does not go away with rest or medicine.  Your pain does not go away in 1 week.  You have new problems.  You do not feel well.  The pain spreads into your legs.  You cannot control when you poop (bowel movement) or pee (urinate)  You feel sick to your stomach (nauseous) or throw up (vomit)  You have belly (abdominal) pain.  You feel like you may pass out (faint).  If you develop a fever.  Make Sure you:  Understand these instructions.  Will watch your condition  Will  get help right away if you are not doing well or get worse.  Your e-visit answers were reviewed by a board certified advanced clinical practitioner to complete your personal care plan.  Depending on the condition, your plan could have included both over the counter or prescription medications.  If there is a problem please reply  once you have received a response from your provider.  Your safety is important to [place].  If you have drug allergies check your prescription carefully.    You can use MyChart to ask questions about today's visit, request a non-urgent call back, or ask for a work or school excuse for 24 hours related to this e-Visit. If it has been greater than 24 hours you will need to follow up with your provider, or enter a new e-Visit to address those concerns.  You will get an e-mail in the next two days asking about your  experience.  I hope that your e-visit has been valuable and will speed your recovery. Thank you for using e-visits.  Greater than 5 minutes, yet less than 10 minutes of time have been spent researching, coordinating, and implementing care for this patient today

## 2021-01-05 ENCOUNTER — Telehealth: Payer: BC Managed Care – PPO | Admitting: Emergency Medicine

## 2021-01-05 DIAGNOSIS — M546 Pain in thoracic spine: Secondary | ICD-10-CM

## 2021-01-05 NOTE — Progress Notes (Signed)
It's hard to say without performing a physical exam.  You may also need advanced imaging.  Based on what you shared with me, I feel your condition warrants further evaluation and I recommend that you be seen for a face to face visit.  Since your PCP is out, I would recommend that you go to an orthopedic, this is likely who your PCP would refer you to anyway.  I recommend [facility].  Phone: [phone] [address], [place], [place] [address] [email or web address]   If you do not have a PCP, [facility] offers a free physician referral service available at [phone]. Our trained staff has the experience, knowledge and resources to put you in touch with a physician who is right for you.   You also have the option of a video visit through [email or web address]  If you are having a true medical emergency please call 911.  NOTE: If you entered your credit card information for this eVisit, you will not be charged. You may see a "hold" on your card for the $35 but that hold will drop off and you will not have a charge processed.  Your e-visit answers were reviewed by a board certified advanced clinical practitioner to complete your personal care plan.  Thank you for using e-Visits.   Approximately 5 minutes was used in reviewing the patient's chart, questionnaire, prescribing medications, and documentation.

## 2021-01-11 ENCOUNTER — Encounter: Payer: Self-pay | Admitting: Internal Medicine

## 2021-01-11 NOTE — Telephone Encounter (Signed)
Please review.  KP

## 2021-01-14 ENCOUNTER — Other Ambulatory Visit: Payer: Self-pay | Admitting: Internal Medicine

## 2021-01-19 ENCOUNTER — Other Ambulatory Visit: Payer: Self-pay

## 2021-01-19 ENCOUNTER — Encounter: Payer: Self-pay | Admitting: Gastroenterology

## 2021-01-19 DIAGNOSIS — Z1211 Encounter for screening for malignant neoplasm of colon: Secondary | ICD-10-CM

## 2021-01-19 MED ORDER — PEG 3350-KCL-NA BICARB-NACL 420 G PO SOLR: 4000.0000 mL | Freq: Once | ORAL | 0 refills | Status: AC

## 2021-01-19 MED ORDER — NA SULFATE-K SULFATE-MG SULF 17.5-3.13-1.6 GM/177ML PO SOLN: 354.0000 mL | Freq: Once | ORAL | 0 refills | Status: AC

## 2021-01-19 MED ORDER — MAGNESIUM CITRATE PO SOLN: 1.0000 | Freq: Once | ORAL | 0 refills | Status: AC

## 2021-01-19 NOTE — Telephone Encounter (Signed)
Scheduled patient for 02/11/2021 with Dr. Allegra Lai at [facility] over instructions with patient, sent prep to pharmacy and sent instructions to [facility]

## 2021-01-20 ENCOUNTER — Encounter: Payer: Self-pay | Admitting: Gastroenterology

## 2021-02-03 ENCOUNTER — Encounter: Payer: Self-pay | Admitting: Gastroenterology

## 2021-02-03 DIAGNOSIS — K6289 Other specified diseases of anus and rectum: Secondary | ICD-10-CM

## 2021-02-04 ENCOUNTER — Other Ambulatory Visit: Payer: Self-pay | Admitting: Internal Medicine

## 2021-02-04 DIAGNOSIS — E034 Atrophy of thyroid (acquired): Secondary | ICD-10-CM

## 2021-02-04 NOTE — Telephone Encounter (Signed)
Requested medications are due for refill today yes  Requested medications are on the active medication list yes  Last refill 11/27  Last visit 10/2020, last lab 01/2020  Future visit scheduled no, canceled via MyChart  Notes to clinic Failed protocol due to no valid visit within 12  months and lab work older than 360 days, no upcoming visit scheduled.

## 2021-02-09 ENCOUNTER — Other Ambulatory Visit: Payer: Self-pay

## 2021-02-09 ENCOUNTER — Other Ambulatory Visit
Admission: RE | Admit: 2021-02-09 | Discharge: 2021-02-09 | Disposition: A | Discharge status: Home / Self Care | Payer: BC Managed Care – PPO | Source: Ambulatory Visit | Attending: Gastroenterology | Admitting: Gastroenterology

## 2021-02-09 DIAGNOSIS — Z01812 Encounter for preprocedural laboratory examination: Secondary | ICD-10-CM | POA: Insufficient documentation

## 2021-02-09 DIAGNOSIS — K644 Residual hemorrhoidal skin tags: Secondary | ICD-10-CM | POA: Diagnosis not present

## 2021-02-09 DIAGNOSIS — Z7984 Long term (current) use of oral hypoglycemic drugs: Secondary | ICD-10-CM | POA: Diagnosis not present

## 2021-02-09 DIAGNOSIS — Z79899 Other long term (current) drug therapy: Secondary | ICD-10-CM | POA: Diagnosis not present

## 2021-02-09 DIAGNOSIS — Z1211 Encounter for screening for malignant neoplasm of colon: Secondary | ICD-10-CM | POA: Diagnosis not present

## 2021-02-09 DIAGNOSIS — Z20822 Contact with and (suspected) exposure to covid-19: Secondary | ICD-10-CM | POA: Insufficient documentation

## 2021-02-09 DIAGNOSIS — Z885 Allergy status to narcotic agent status: Secondary | ICD-10-CM | POA: Diagnosis not present

## 2021-02-09 DIAGNOSIS — Z880 Allergy status to penicillin: Secondary | ICD-10-CM | POA: Diagnosis not present

## 2021-02-09 DIAGNOSIS — K5730 Diverticulosis of large intestine without perforation or abscess without bleeding: Secondary | ICD-10-CM | POA: Diagnosis not present

## 2021-02-09 DIAGNOSIS — Z79890 Hormone replacement therapy (postmenopausal): Secondary | ICD-10-CM | POA: Diagnosis not present

## 2021-02-10 ENCOUNTER — Encounter: Payer: Self-pay | Admitting: Gastroenterology

## 2021-02-10 ENCOUNTER — Encounter: Payer: BC Managed Care – PPO | Admitting: Internal Medicine

## 2021-02-10 LAB — SARS CORONAVIRUS 2 (TAT 6-24 HRS): SARS Coronavirus 2: NEGATIVE

## 2021-02-11 ENCOUNTER — Other Ambulatory Visit: Payer: Self-pay

## 2021-02-11 ENCOUNTER — Ambulatory Visit: Payer: BC Managed Care – PPO | Admitting: Anesthesiology

## 2021-02-11 ENCOUNTER — Encounter: Payer: Self-pay | Admitting: Gastroenterology

## 2021-02-11 ENCOUNTER — Ambulatory Visit
Admission: RE | Admit: 2021-02-11 | Discharge: 2021-02-11 | Disposition: A | Discharge status: Home / Self Care | Payer: BC Managed Care – PPO | Attending: Gastroenterology | Admitting: Gastroenterology

## 2021-02-11 ENCOUNTER — Encounter
Admission: RE | Disposition: A | Discharge status: Home / Self Care | Payer: Self-pay | Source: Home / Self Care | Attending: Gastroenterology

## 2021-02-11 DIAGNOSIS — Z7984 Long term (current) use of oral hypoglycemic drugs: Secondary | ICD-10-CM | POA: Insufficient documentation

## 2021-02-11 DIAGNOSIS — K644 Residual hemorrhoidal skin tags: Secondary | ICD-10-CM | POA: Insufficient documentation

## 2021-02-11 DIAGNOSIS — K5730 Diverticulosis of large intestine without perforation or abscess without bleeding: Secondary | ICD-10-CM | POA: Insufficient documentation

## 2021-02-11 DIAGNOSIS — Z79899 Other long term (current) drug therapy: Secondary | ICD-10-CM | POA: Insufficient documentation

## 2021-02-11 DIAGNOSIS — Z79890 Hormone replacement therapy (postmenopausal): Secondary | ICD-10-CM | POA: Insufficient documentation

## 2021-02-11 DIAGNOSIS — Z20822 Contact with and (suspected) exposure to covid-19: Secondary | ICD-10-CM | POA: Insufficient documentation

## 2021-02-11 DIAGNOSIS — Z1211 Encounter for screening for malignant neoplasm of colon: Secondary | ICD-10-CM | POA: Diagnosis not present

## 2021-02-11 DIAGNOSIS — Z885 Allergy status to narcotic agent status: Secondary | ICD-10-CM | POA: Insufficient documentation

## 2021-02-11 DIAGNOSIS — Z880 Allergy status to penicillin: Secondary | ICD-10-CM | POA: Insufficient documentation

## 2021-02-11 HISTORY — DX: Anxiety disorder, unspecified: F41.9

## 2021-02-11 HISTORY — DX: Obesity, class 3: E66.813

## 2021-02-11 HISTORY — DX: Depression, unspecified: F32.A

## 2021-02-11 HISTORY — PX: COLONOSCOPY WITH PROPOFOL: SHX5780

## 2021-02-11 HISTORY — DX: Fatty (change of) liver, not elsewhere classified: K76.0

## 2021-02-11 HISTORY — DX: Hypothyroidism, unspecified: E03.9

## 2021-02-11 HISTORY — DX: Gastro-esophageal reflux disease without esophagitis: K21.9

## 2021-02-11 HISTORY — DX: Morbid (severe) obesity due to excess calories: E66.01

## 2021-02-11 SURGERY — COLONOSCOPY WITH PROPOFOL
Anesthesia: General

## 2021-02-11 MED ORDER — PROPOFOL 500 MG/50ML IV EMUL
INTRAVENOUS | Status: AC
  Filled 2021-02-11: qty 50

## 2021-02-11 MED ORDER — SODIUM CHLORIDE 0.9 % IV SOLN: INTRAVENOUS | Status: DC

## 2021-02-11 MED ORDER — PROPOFOL 500 MG/50ML IV EMUL: INTRAVENOUS | Status: DC | PRN

## 2021-02-11 MED ORDER — PROPOFOL 10 MG/ML IV BOLUS
INTRAVENOUS | Status: AC
  Filled 2021-02-11: qty 20

## 2021-02-11 MED ORDER — PROPOFOL 500 MG/50ML IV EMUL
INTRAVENOUS | Status: DC | PRN
  Administered 2021-02-11: 120 ug/kg/min via INTRAVENOUS

## 2021-02-11 NOTE — H&P (Signed)
Cindy Repress, MD [address]  [address]  [place], [place] [address]  Main: [phone]  Fax: [phone] Pager: [phone]  Primary Care Physician:  [name], MD Primary Gastroenterologist:  Dr. Arlyss Pena  Pre-Procedure History & Physical: HPI:  Cindy Pena is a 50 y.o. female is here for an colonoscopy.   Past Medical History:  Diagnosis Date  . Anxiety   . Depression   . Fatty liver   . GERD (gastroesophageal reflux disease)   . Hypertension   . Hypothyroidism   . Obesity, Class III, BMI 40-49.9 (morbid obesity) (HCC)   . PCOS (polycystic ovarian syndrome)   . Thyroid disease     Past Surgical History:  Procedure Laterality Date  . c section  1996   September- SON Birth  . CHOLECYSTECTOMY  2003  . DILATION AND CURETTAGE OF UTERUS  1996 & 2006  . ooph Right 1995   Right tube removed- mass was on tube.  Marland Kitchen TOTAL ABDOMINAL HYSTERECTOMY  2009   Right ovary attached to center of stomach.    Prior to Admission medications   Medication Sig Start Date End Date Taking? Authorizing Provider  docusate sodium (COLACE) 100 MG capsule Take 100 mg by mouth 2 (two) times daily.   Yes [provider]  levothyroxine (SYNTHROID) 125 MCG tablet TAKE 1 TABLET(125 MCG) BY MOUTH DAILY BEFORE BREAKFAST 02/04/21  Yes [name], MD  linaclotide [facility]) 145 MCG CAPS capsule TAKE 1 CAPSULE(145 MCG) BY MOUTH DAILY BEFORE BREAKFAST 11/16/20  Yes Jude Naclerio, Loel Dubonnet, MD  lisinopril (ZESTRIL) 10 MG tablet TAKE 1 TABLET(10 MG) BY MOUTH DAILY 01/14/21  Yes [name], MD  metFORMIN (GLUCOPHAGE) 500 MG tablet Take 1 tablet (500 mg total) by mouth daily with breakfast. 10/21/20  Yes [name], MD  methocarbamol (ROBAXIN) 500 MG tablet Take 1 tablet (500 mg total) by mouth 2 (two) times daily. 12/07/20  Yes Becky Augusta, NP  Misc Natural Products (ESTROVEN + ENERGY MAX STRENGTH PO) Take by mouth daily.   Yes [provider]   cimetidine (TAGAMET) 200 MG tablet Take 200 mg by mouth 2 (two) times daily.  07/13/19  [provider]  metoprolol tartrate (LOPRESSOR) 25 MG tablet Take 0.5 tablets (12.5 mg total) by mouth daily as needed. 10/21/20 12/07/20  [name], MD  omeprazole (PRILOSEC) 20 MG capsule TAKE 1 CAPSULE(20 MG) BY MOUTH DAILY 08/17/20 12/07/20  [name], MD    Allergies as of 01/19/2021 - Review Complete 12/11/2020  Allergen Reaction Noted  . Darvon [propoxyphene] Other (See Comments) 05/31/2017  . Penicillins Other (See Comments) 05/31/2017  . Tramadol hcl Rash 05/31/2017  . Other Other (See Comments) 12/25/2018    Family History  Problem Relation Age of Onset  . Hypertension Father   . Prostate cancer Father   . Hypertension Sister   . Cancer Paternal Aunt   . Breast cancer Paternal Aunt     Social History   Socioeconomic History  . Marital status: Single    Spouse name: Not on file  . Number of children: 1  . Years of education: Not on file  . Highest education level: Not on file  Occupational History  . Occupation: Airline pilot  Tobacco Use  . Smoking status: Never Smoker  . Smokeless tobacco: Never Used  Vaping Use  . Vaping Use: Never used  Substance and Sexual Activity  . Alcohol use: No  . Drug use: No  .  Sexual activity: Not Currently    Comment: hysterectomy  Other Topics Concern  . Not on file  Social History Narrative  . Not on file   Social Determinants of Health   Financial Resource Strain: Not on file  Food Insecurity: Not on file  Transportation Needs: Not on file  Physical Activity: Not on file  Stress: Not on file  Social Connections: Not on file  Intimate Partner Violence: Not on file    Review of Systems: See HPI, otherwise negative ROS  Physical Exam: BP 126/82   Pulse 78   Temp (!) 97 F (36.1 C) (Temporal)   Resp 15   Ht 5\' 3"  (1.6 m)   Wt 90.7 kg   LMP  (LMP Unknown)   SpO2 100%   BMI 35.43 kg/m  General:    Alert,  pleasant and cooperative in NAD Head:  Normocephalic and atraumatic. Neck:  Supple; no masses or thyromegaly. Lungs:  Clear throughout to auscultation.    Heart:  Regular rate and rhythm. Abdomen:  Soft, nontender and nondistended. Normal bowel sounds, without guarding, and without rebound.   Neurologic:  Alert and  oriented x4;  grossly normal neurologically.  Impression/Plan: Cindy Pena is here for an colonoscopy to be performed for colon cancer screening  Risks, benefits, limitations, and alternatives regarding  colonoscopy have been reviewed with the patient.  Questions have been answered.  All parties agreeable.   Merry Proud, MD  02/11/2021, 9:33 AM

## 2021-02-11 NOTE — Anesthesia Procedure Notes (Signed)
Performed by: Cook-Martin, Jahzaria Vary Pre-anesthesia Checklist: Patient identified, Emergency Drugs available, Suction available, Patient being monitored and Timeout performed Patient Re-evaluated:Patient Re-evaluated prior to induction Oxygen Delivery Method: Simple face mask Preoxygenation: Pre-oxygenation with 100% oxygen Induction Type: IV induction Placement Confirmation: CO2 detector and positive ETCO2       

## 2021-02-11 NOTE — Anesthesia Postprocedure Evaluation (Signed)
Anesthesia Post Note  Patient: Cindy Pena  Procedure(s) Performed: COLONOSCOPY WITH PROPOFOL (N/A )  Patient location during evaluation: Endoscopy Anesthesia Type: General Level of consciousness: awake and alert Pain management: pain level controlled Vital Signs Assessment: post-procedure vital signs reviewed and stable Respiratory status: spontaneous breathing and respiratory function stable Cardiovascular status: stable Anesthetic complications: no   No complications documented.   Last Vitals:  Vitals:   02/11/21 1028 02/11/21 1038  BP: 132/77 134/72  Pulse:    Resp:    Temp:    SpO2:      Last Pain:  Vitals:   02/11/21 1038  TempSrc:   PainSc: 0-No pain                 Cindy Pena

## 2021-02-11 NOTE — Anesthesia Preprocedure Evaluation (Signed)
Anesthesia Evaluation  Patient identified by MRN, date of birth, ID band Patient awake    Reviewed: Allergy & Precautions, NPO status , Patient's Chart, lab work & pertinent test results  History of Anesthesia Complications Negative for: history of anesthetic complications  Airway Mallampati: III       Dental   Pulmonary neg sleep apnea, neg COPD, Not current smoker,           Cardiovascular hypertension, Pt. on medications (-) Past MI and (-) CHF (-) dysrhythmias (-) Valvular Problems/Murmurs     Neuro/Psych neg Seizures Anxiety Depression    GI/Hepatic Neg liver ROS, GERD  Medicated and Controlled,  Endo/Other  neg diabetesHypothyroidism   Renal/GU negative Renal ROS     Musculoskeletal   Abdominal   Peds  Hematology   Anesthesia Other Findings   Reproductive/Obstetrics                             Anesthesia Physical Anesthesia Plan  ASA: II  Anesthesia Plan: General   Post-op Pain Management:    Induction:   PONV Risk Score and Plan: 3 and Propofol infusion and TIVA  Airway Management Planned: Nasal Cannula  Additional Equipment:   Intra-op Plan:   Post-operative Plan:   Informed Consent: I have reviewed the patients History and Physical, chart, labs and discussed the procedure including the risks, benefits and alternatives for the proposed anesthesia with the patient or authorized representative who has indicated his/her understanding and acceptance.       Plan Discussed with:   Anesthesia Plan Comments:         Anesthesia Quick Evaluation

## 2021-02-11 NOTE — Transfer of Care (Signed)
Immediate Anesthesia Transfer of Care Note  Patient: Cindy Pena  Procedure(s) Performed: COLONOSCOPY WITH PROPOFOL (N/A )  Patient Location: PACU  Anesthesia Type:General  Level of Consciousness: awake and sedated  Airway & Oxygen Therapy: Patient Spontanous Breathing and Patient connected to face mask oxygen  Post-op Assessment: Report given to RN and Post -op Vital signs reviewed and stable  Post vital signs: Reviewed and stable  Last Vitals:  Vitals Value Taken Time  BP    Temp    Pulse    Resp    SpO2      Last Pain:  Vitals:   02/11/21 0843  TempSrc: Temporal  PainSc: 0-No pain         Complications: No complications documented.

## 2021-02-11 NOTE — Op Note (Signed)
[facility] Patient Name: Cindy Pena Procedure Date: 02/11/2021 9:33 AM MRN: [id number] Account #: [id number] Date of Birth: [birth year] Admit Type: Outpatient Age: 50 Room: [facility] ENDO ROOM 4 Gender: Female Note Status: Finalized Procedure:             Colonoscopy Indications:           Screening for colorectal malignant neoplasm, This is                         the patient's first colonoscopy Providers:             Toney Reil MD, MD Referring MD:          Bari Edward, MD (Referring MD) Medicines:             General Anesthesia Complications:         No immediate complications. Estimated blood loss: None. Procedure:             Pre-Anesthesia Assessment:                        - Prior to the procedure, a History and Physical was                         performed, and patient medications and allergies were                         reviewed. The patient is competent. The risks and                         benefits of the procedure and the sedation options and                         risks were discussed with the patient. All questions                         were answered and informed consent was obtained.                         Patient identification and proposed procedure were                         verified by the physician, the nurse, the                         anesthesiologist, the anesthetist and the technician                         in the pre-procedure area in the procedure room in the                         endoscopy suite. Mental Status Examination: alert and                         oriented. Airway Examination: normal oropharyngeal                         airway and neck mobility. Respiratory Examination:  clear to auscultation. CV Examination: normal.                         Prophylactic Antibiotics: The patient does not require                         prophylactic antibiotics. Prior  Anticoagulants: The                         patient has taken no previous anticoagulant or                         antiplatelet agents. ASA Grade Assessment: II - A                         patient with mild systemic disease. After reviewing                         the risks and benefits, the patient was deemed in                         satisfactory condition to undergo the procedure. The                         anesthesia plan was to use general anesthesia.                         Immediately prior to administration of medications,                         the patient was re-assessed for adequacy to receive                         sedatives. The heart rate, respiratory rate, oxygen                         saturations, blood pressure, adequacy of pulmonary                         ventilation, and response to care were monitored                         throughout the procedure. The physical status of the                         patient was re-assessed after the procedure.                        After obtaining informed consent, the colonoscope was                         passed under direct vision. Throughout the procedure,                         the patient's blood pressure, pulse, and oxygen                         saturations were monitored continuously. The  Colonoscope was introduced through the anus and                         advanced to the the terminal ileum, with                         identification of the appendiceal orifice and IC                         valve. The colonoscopy was performed with moderate                         difficulty due to multiple diverticula in the colon,                         significant looping and the patient's body habitus.                         Successful completion of the procedure was aided by                         applying abdominal pressure. The patient tolerated the                         procedure well. The  quality of the bowel preparation                         was evaluated using the BBPS [facility] Bowel Preparation                         Scale) with scores of: Right Colon = 3 (entire mucosa                         seen well with no residual staining, small fragments                         of stool or opaque liquid), Transverse Colon = 3                         (entire mucosa seen well with no residual staining,                         small fragments of stool or opaque liquid) and Left                         Colon = 3 (entire mucosa seen well with no residual                         staining, small fragments of stool or opaque liquid).                         The total BBPS score equals 9. Findings:      The perianal and digital rectal examinations were normal. Pertinent       negatives include normal sphincter tone and no palpable rectal lesions.      The terminal ileum appeared normal.      Multiple diverticula were  found in the sigmoid colon.      Non-bleeding external hemorrhoids were found during retroflexion. The       hemorrhoids were small. Impression:            - The examined portion of the ileum was normal.                        - Diverticulosis in the sigmoid colon.                        - Non-bleeding external hemorrhoids.                        - No specimens collected. Recommendation:        - Discharge patient to home (with escort).                        - Resume previous diet today.                        - Continue present medications.                        - Repeat colonoscopy in 10 years for screening                         purposes. Procedure Code(s):     --- Professional ---                        I6270, Colorectal cancer screening; colonoscopy on                         individual not meeting criteria for high risk Diagnosis Code(s):     --- Professional ---                        Z12.11, Encounter for screening for malignant neoplasm                          of colon                        K64.4, Residual hemorrhoidal skin tags                        K57.30, Diverticulosis of large intestine without                         perforation or abscess without bleeding CPT copyright 2019 American Medical Association. All rights reserved. The codes documented in this report are preliminary and upon coder review may  be revised to meet current compliance requirements. Dr. Libby Maw Toney Reil MD, MD 02/11/2021 10:17:46 AM This report has been signed electronically. Number of Addenda: 0 Note Initiated On: 02/11/2021 9:33 AM Scope Withdrawal Time: 0 hours 16 minutes 50 seconds  Total Procedure Duration: 0 hours 27 minutes 38 seconds  Estimated Blood Loss:  Estimated blood loss: none.      [facility]

## 2021-02-13 ENCOUNTER — Other Ambulatory Visit: Payer: Self-pay | Admitting: Internal Medicine

## 2021-02-13 ENCOUNTER — Encounter: Payer: Self-pay | Admitting: Internal Medicine

## 2021-02-13 DIAGNOSIS — K219 Gastro-esophageal reflux disease without esophagitis: Secondary | ICD-10-CM

## 2021-02-14 ENCOUNTER — Other Ambulatory Visit: Payer: Self-pay | Admitting: Internal Medicine

## 2021-02-14 DIAGNOSIS — K219 Gastro-esophageal reflux disease without esophagitis: Secondary | ICD-10-CM

## 2021-02-14 MED ORDER — OMEPRAZOLE 20 MG PO CPDR: 20.0000 mg | DELAYED_RELEASE_CAPSULE | Freq: Every day | ORAL | 1 refills | Status: DC

## 2021-02-15 NOTE — Telephone Encounter (Signed)
Placed referral per Dr. Vanga request  

## 2021-03-02 ENCOUNTER — Encounter: Payer: Self-pay | Admitting: Internal Medicine

## 2021-03-05 ENCOUNTER — Telehealth: Payer: BC Managed Care – PPO | Admitting: Emergency Medicine

## 2021-03-05 DIAGNOSIS — J329 Chronic sinusitis, unspecified: Secondary | ICD-10-CM

## 2021-03-05 DIAGNOSIS — B9689 Other specified bacterial agents as the cause of diseases classified elsewhere: Secondary | ICD-10-CM

## 2021-03-05 MED ORDER — DOXYCYCLINE HYCLATE 100 MG PO TABS: 100.0000 mg | ORAL_TABLET | Freq: Two times a day (BID) | ORAL | 0 refills | Status: DC

## 2021-03-05 NOTE — Progress Notes (Signed)

## 2021-03-09 ENCOUNTER — Other Ambulatory Visit: Payer: Self-pay

## 2021-03-09 ENCOUNTER — Encounter: Payer: Self-pay | Admitting: Internal Medicine

## 2021-03-09 MED ORDER — ONDANSETRON 4 MG PO TBDP: 4.0000 mg | ORAL_TABLET | Freq: Three times a day (TID) | ORAL | 0 refills | Status: DC | PRN

## 2021-03-15 ENCOUNTER — Encounter: Payer: Self-pay | Admitting: Surgery

## 2021-03-15 ENCOUNTER — Ambulatory Visit (INDEPENDENT_AMBULATORY_CARE_PROVIDER_SITE_OTHER): Payer: BC Managed Care – PPO | Admitting: Surgery

## 2021-03-15 ENCOUNTER — Other Ambulatory Visit: Payer: Self-pay

## 2021-03-15 ENCOUNTER — Ambulatory Visit: Payer: Self-pay | Admitting: Surgery

## 2021-03-15 VITALS — BP 137/84 | HR 71 | Temp 98.1°F | Ht 63.0 in | Wt 198.0 lb

## 2021-03-15 DIAGNOSIS — K6289 Other specified diseases of anus and rectum: Secondary | ICD-10-CM

## 2021-03-15 NOTE — Patient Instructions (Addendum)
Please pick up your prescription at [facility] in [place] for Nifedipine 0.3%. Apply cream twice daily. If you have any concerns or questions, please feel free to call our office.     How to Take a [employer] A sitz bath is a warm water bath that may be used to care for your rectum, genital area, or the area between your rectum and genitals (perineum). In a sitz bath, the water only comes up to your hips and covers your buttocks. A sitz bath may be done in a bathtub or with a portable sitz bath that fits over the toilet. Your health care provider may recommend a sitz bath to help:  Relieve pain and discomfort after delivering a baby.  Relieve pain and itching from hemorrhoids or anal fissures.  Relieve pain after certain surgeries.  Relax muscles that are sore or tight. How to take a sitz bath Take 3-4 sitz baths a day, or as many as told by your health care provider. Bathtub sitz bath To take a sitz bath in a bathtub: 1. Partially fill a bathtub with warm water. The water should be deep enough to cover your hips and buttocks when you are sitting in the tub. 2. Follow your health care provider's instructions if you are told to put medicine in the water. 3. Sit in the water. Open the tub drain a little, and leave it open during your bath. 4. Turn on the warm water again, enough to replace the water that is draining out. Keep the water running throughout your bath. This helps keep the water at the right level and temperature. 5. Soak in the water for 15-20 minutes, or as long as told by your health care provider. 6. When you are done, be careful when you stand up. You may feel dizzy. 7. After the sitz bath, pat yourself dry. Do not rub your skin to dry it.   Over-the-toilet sitz bath To take a sitz bath with an over-the-toilet basin: 1. Follow the manufacturer's instructions. 2. Fill the basin with warm water. 3. Follow your health care provider's instructions if you were told to  put medicine in the water. 4. Sit on the seat. Make sure the water covers your buttocks and perineum. 5. Soak in the water for 15-20 minutes, or as long as told by your health care provider. 6. After the sitz bath, pat yourself dry. Do not rub your skin to dry it. 7. Clean and dry the basin between uses. 8. Discard the basin if it cracks, or according to the manufacturer's instructions.   Contact a health care provider if:  Your pain or itching gets worse. Do not continue with sitz baths if your symptoms get worse.  You have new symptoms. Do not continue with sitz baths until you talk with your health care provider. Summary  A sitz bath is a warm water bath in which the water only comes up to your hips and covers your buttocks.  A sitz bath may help relieve pain and discomfort after delivering a baby. It also may help with pain and itching from hemorrhoids or anal fissures, or pain after certain surgeries. It can also help to relax muscles that are sore or tight.  Take 3-4 sitz baths a day, or as many as told by your health care provider. Soak in the water for 15-20 minutes.  Do not continue with sitz baths if your symptoms get worse. This information is not intended to replace advice given to  you by your health care provider. Make sure you discuss any questions you have with your health care provider. Document Revised: 08/13/2020 Document Reviewed: 08/13/2020 Elsevier Patient Education  2021 Elsevier Inc.  

## 2021-03-17 ENCOUNTER — Encounter: Payer: Self-pay | Admitting: Surgery

## 2021-03-17 NOTE — Progress Notes (Signed)
Patient ID: CHOLE DRIVER, female   DOB: January 05, 1971, 50 y.o.   MRN: [id number]  HPI MEAGHANN CHOO is a 50 y.o. female  He does have a history of anorectal pain for a few months.  She has been seen by Dr. Allegra Lai and initially was prescribed nitroglycerin cream.  He also had a recent colonoscopy that I have personally reviewed the images showing some external hemorrhoids and diverticulosis.  No other acute anorectal abnormalities were seen.  Continues to have associated increase in bowel movements. Reports intermittent rectal pain that feels like a spasms.  They are moderate to severe intensity.  No specific alleviating or aggravating factors. I have personally reviewed the ultrasound showing no evidence of any biliary issues.  He does have a fatty liver.  She did have a prior cholecystectomy. CMP did show mild elevation of the AST ALT and alkaline phosphatase.  This is likely from hepatic steatosis as confirmed on ultrasound. He has had a prior cholecystectomy     HPI  Past Medical History:  Diagnosis Date  . Anxiety   . Depression   . Fatty liver   . GERD (gastroesophageal reflux disease)   . Hypertension   . Hypothyroidism   . Obesity, Class III, BMI 40-49.9 (morbid obesity) (HCC)   . PCOS (polycystic ovarian syndrome)   . Thyroid disease     Past Surgical History:  Procedure Laterality Date  . c section  1996   September- SON Birth  . CHOLECYSTECTOMY  2003  . COLONOSCOPY WITH PROPOFOL N/A 02/11/2021   Procedure: COLONOSCOPY WITH PROPOFOL;  Surgeon: Toney Reil, MD;  Location: [facility] ENDOSCOPY;  Service: Gastroenterology;  Laterality: N/A;  . DILATION AND CURETTAGE OF UTERUS  1996 & 2006  . ooph Right 1995   Right tube removed- mass was on tube.  Marland Kitchen TOTAL ABDOMINAL HYSTERECTOMY  2009   Right ovary attached to center of stomach.    Family History  Problem Relation Age of Onset  . Hypertension Father   . Prostate cancer Father   . Hypertension Sister    . Cancer Paternal Aunt   . Breast cancer Paternal Aunt     Social History Social History   Tobacco Use  . Smoking status: Never Smoker  . Smokeless tobacco: Never Used  Vaping Use  . Vaping Use: Never used  Substance Use Topics  . Alcohol use: No  . Drug use: No    Allergies  Allergen Reactions  . Darvon [Propoxyphene] Other (See Comments)    Chest Pain.  Marland Kitchen Penicillins Other (See Comments)    Migraines.  . Tramadol Hcl Rash  . Other Other (See Comments)    Current Outpatient Medications  Medication Sig Dispense Refill  . docusate sodium (COLACE) 100 MG capsule Take 100 mg by mouth 2 (two) times daily.    Marland Kitchen levothyroxine (SYNTHROID) 125 MCG tablet TAKE 1 TABLET(125 MCG) BY MOUTH DAILY BEFORE BREAKFAST 90 tablet 0  . linaclotide (LINZESS) 145 MCG CAPS capsule TAKE 1 CAPSULE(145 MCG) BY MOUTH DAILY BEFORE BREAKFAST 90 capsule 1  . lisinopril (ZESTRIL) 10 MG tablet TAKE 1 TABLET(10 MG) BY MOUTH DAILY 90 tablet 0  . metFORMIN (GLUCOPHAGE) 500 MG tablet Take 1 tablet (500 mg total) by mouth daily with breakfast. 90 tablet 1  . Misc Natural Products (ESTROVEN + ENERGY MAX STRENGTH PO) Take by mouth daily.    Marland Kitchen omeprazole (PRILOSEC) 20 MG capsule Take 1 capsule (20 mg total) by mouth daily. 90 capsule 1  .  ondansetron (ZOFRAN ODT) 4 MG disintegrating tablet Take 1 tablet (4 mg total) by mouth every 8 (eight) hours as needed for nausea or vomiting. 20 tablet 0   No current facility-administered medications for this visit.     Review of Systems Full ROS  was asked and was negative except for the information on the HPI  Physical Exam Blood pressure 137/84, pulse 71, temperature 98.1 F (36.7 C), temperature source Oral, height 5\' 3"  (1.6 m), weight 198 lb (89.8 kg), SpO2 97 %. CONSTITUTIONAL: NAD EYES: Pupils are equal, round,  Sclera are non-icteric. EARS, NOSE, MOUTH AND THROAT: She is wearing a mask. Hearing is intact to voice. LYMPH NODES:  Lymph nodes in the neck are  normal. RESPIRATORY:  Lungs are clear. There is normal respiratory effort, with equal breath sounds bilaterally, and without pathologic use of accessory muscles. CARDIOVASCULAR: Heart is regular without murmurs, gallops, or rubs. GI: The abdomen is soft, nontender, and nondistended. There are no palpable masses. There is no hepatosplenomegaly. There are normal bowel sounds in all quadrants. Rectal: There is anal skin tag.  There is no evidence of any rectal masses.  It is mildly tender to palpation and there is an increase in the sphincter tone.  There is no hematochezia.   MUSCULOSKELETAL: Normal muscle strength and tone. No cyanosis or edema.   SKIN: Turgor is good and there are no pathologic skin lesions or ulcers. NEUROLOGIC: Motor and sensation is grossly normal. Cranial nerves are grossly intact. PSYCH:  Oriented to person, place and time. Affect is normal.  Data Reviewed  I have personally reviewed the patient's imaging, laboratory findings and medical records.    Assessment/Plan Anorectal pain questionable rectal spasms versus anal fissure.  I will recommend medical management.  High-fiber diet, sitz bath's twice daily I will empirically start her on nifedipine cream.  There is no need for surgical intervention at this time.  If symptoms persist will recommend examination with anesthesia and possible Botox if in fact we find a fissure.  She is in agreement.  She wishes to continue above plan and will see back in a few weeks.  Operative this report was sent to the referring provider.  Extensive counseling provided Copy of this report was sent to the referring provider Time spent with the patient was 62 minutes, with more than 50% of the time spent in face-to-face education, counseling and care coordination.     [place], MD FACS General Surgeon 03/17/2021, 4:02 PM

## 2021-04-04 IMAGING — MG DIGITAL SCREENING BILAT W/ TOMO W/ CAD
8 series · 8 of 24 positions shown · non-contrast
Comparison: Previous exam(s).

CLINICAL DATA: Screening.

EXAM:
DIGITAL SCREENING BILATERAL MAMMOGRAM WITH TOMO AND CAD

[R MLO synth-2D]
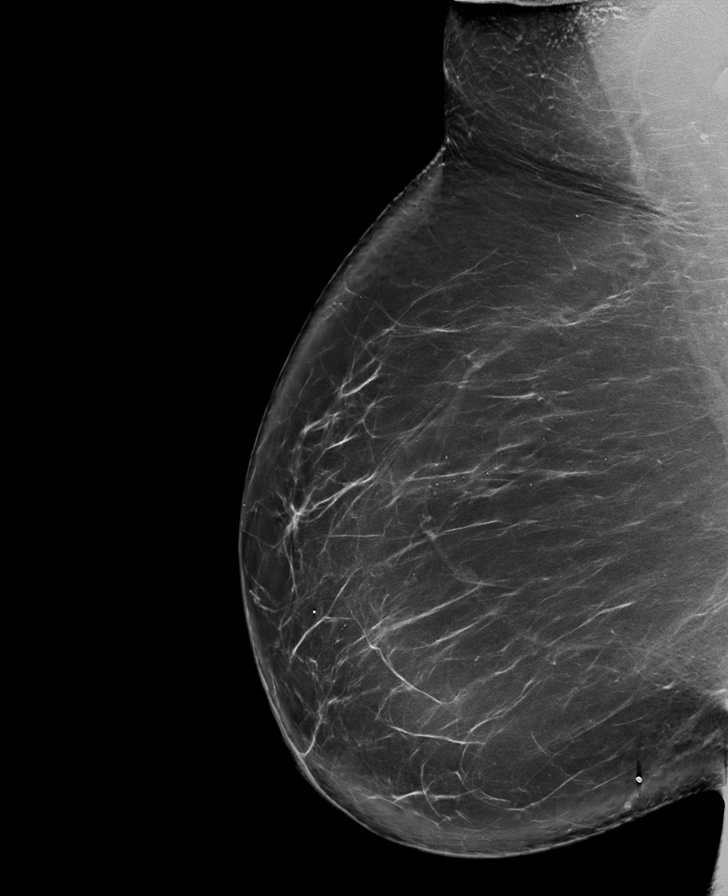

[L MLO synth-2D]
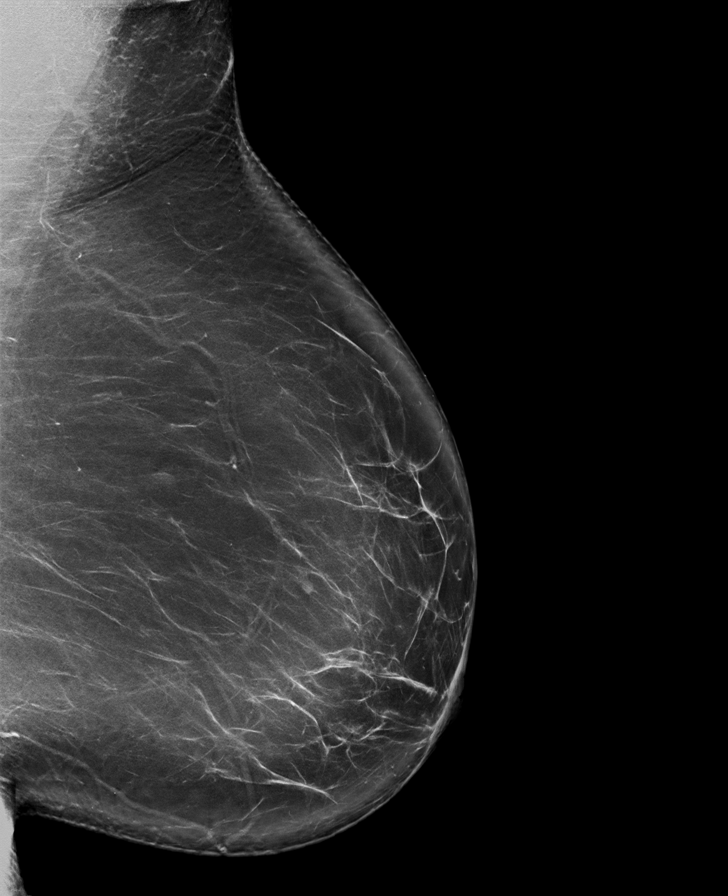

[L CC synth-2D]
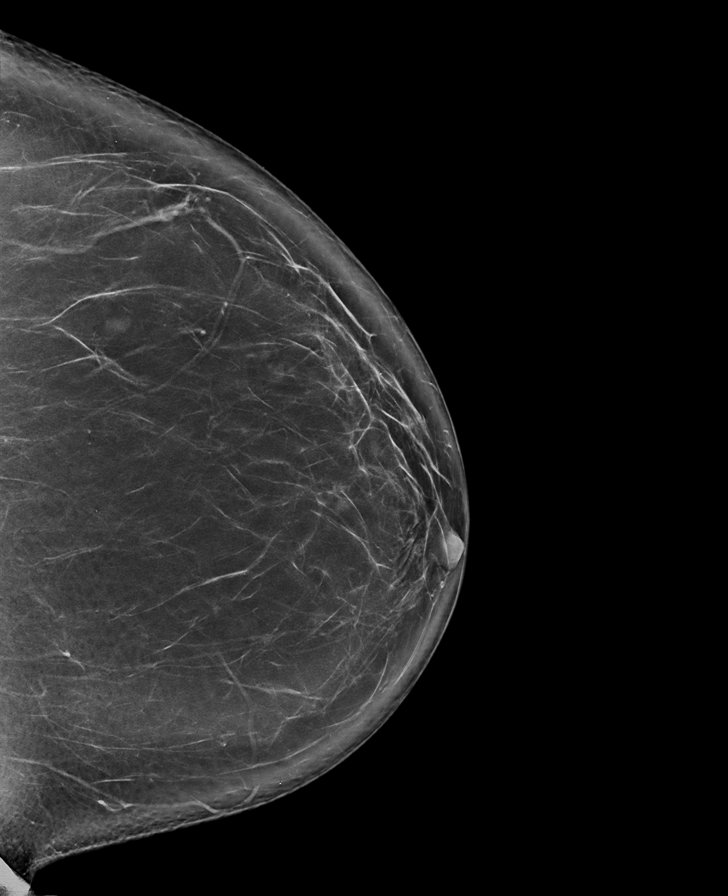

[R CC synth-2D]
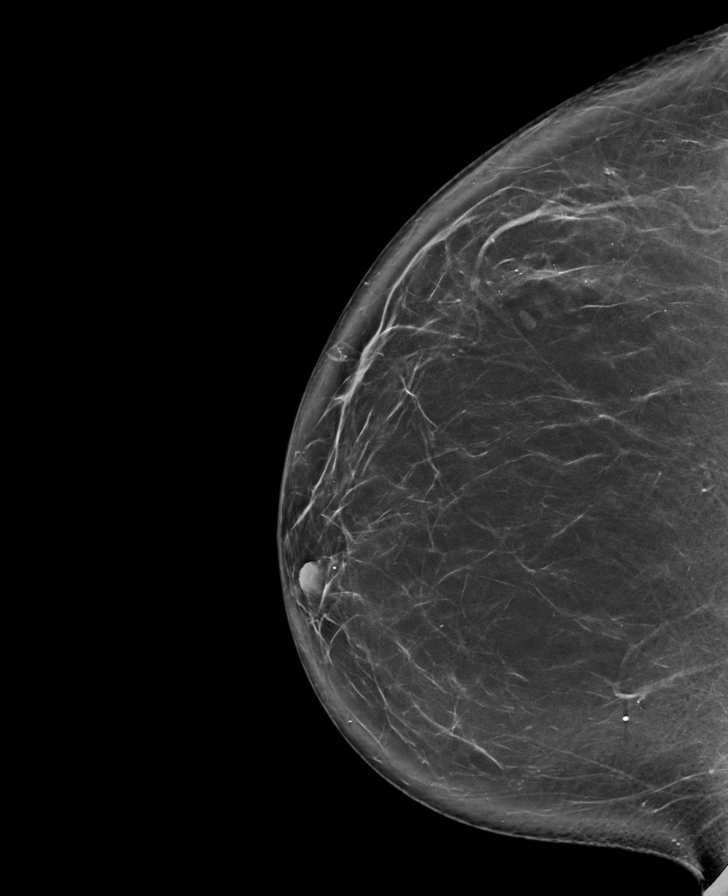

[L CC tomo · tomo slice 47/93.0]
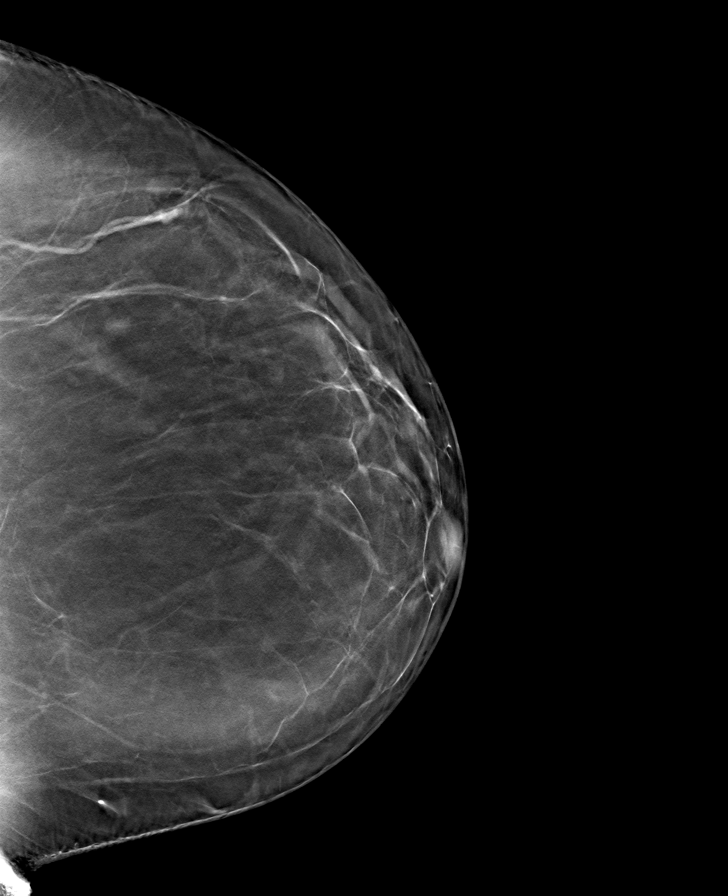

[L MLO tomo · tomo slice 54/107.0]
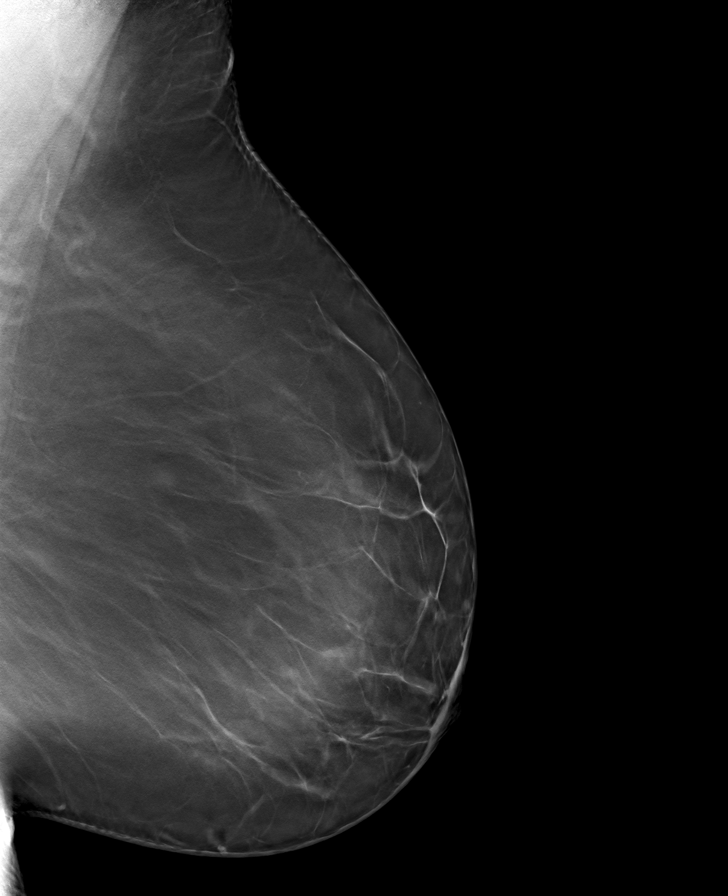

[R CC tomo · tomo slice 46/91.0]
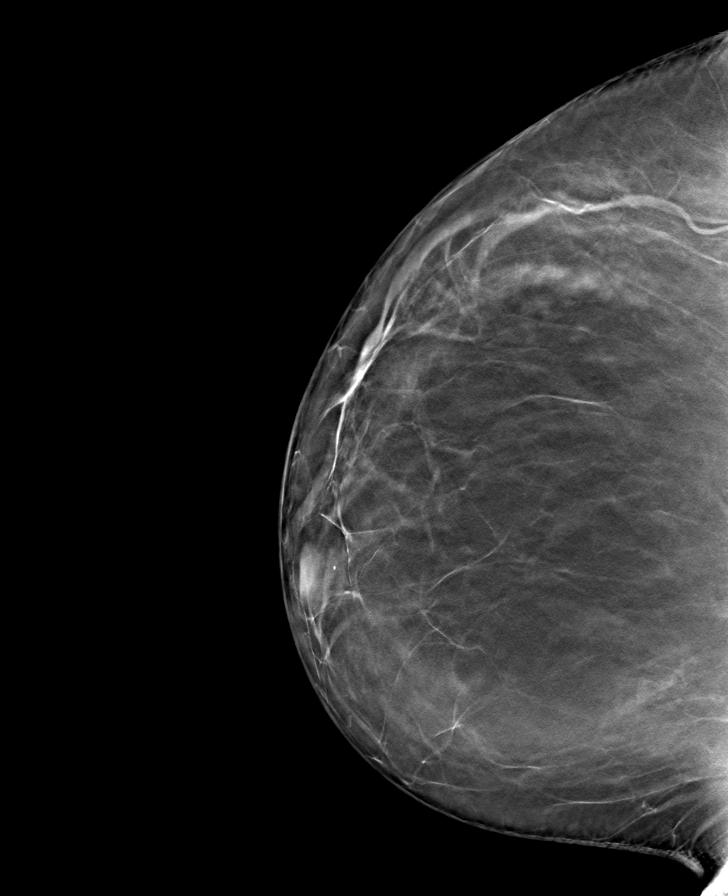

[R MLO tomo · tomo slice 55/110.0]
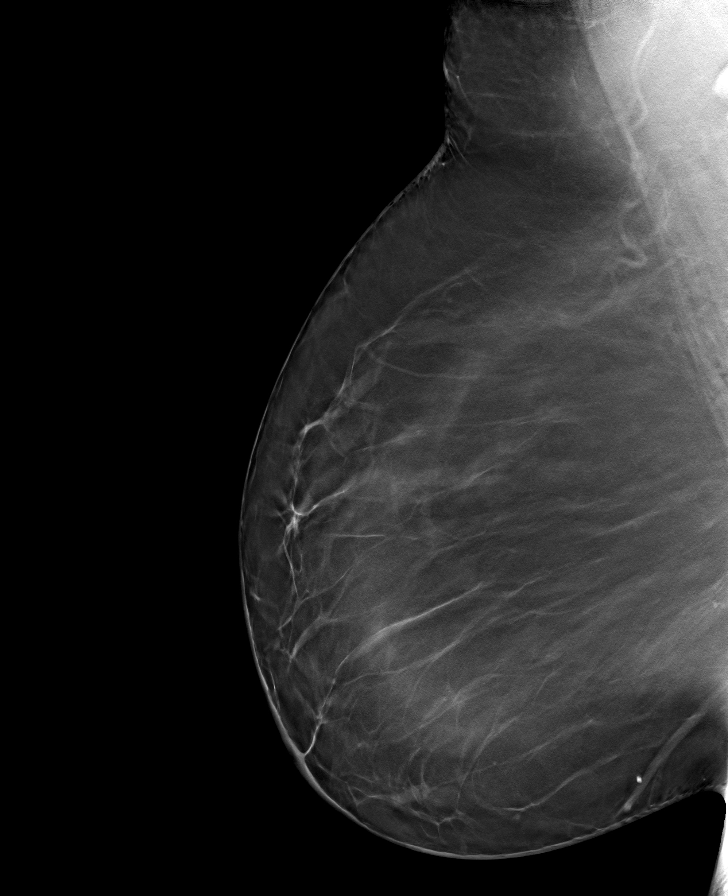

[8 of 24 positions shown; findings below may reference images not displayed]

ACR Breast Density Category b: There are scattered areas of
fibroglandular density.
FINDINGS: There are no findings suspicious for malignancy. Images were
processed with CAD.
IMPRESSION: No mammographic evidence of malignancy. A result letter of this
screening mammogram will be mailed directly to the patient.

RECOMMENDATION:
Screening mammogram in one year. (Code:[id number])

BI-RADS CATEGORY  1: Negative.

## 2021-04-08 ENCOUNTER — Other Ambulatory Visit: Payer: Self-pay | Admitting: Internal Medicine

## 2021-04-21 ENCOUNTER — Telehealth: Payer: Self-pay | Admitting: Gastroenterology

## 2021-04-21 NOTE — Telephone Encounter (Signed)
Called and patient verbalized understanding of results  °

## 2021-04-21 NOTE — Telephone Encounter (Signed)
States the pain comes and goes. She states the sharp pain that last for a few seconds. Patient is having normal bowel bowel movements. Patient denies any constipation, diarrhea or nausea

## 2021-04-21 NOTE — Telephone Encounter (Signed)
There is nothing based on the colonoscopy to explain pain in her right pelvic area.  Not sure if she has acute appendicitis and if her pain is in the right lower quadrant.  If the pain is severe, recommend to go to ER  I can see her in office tomorrow afternoon if she is willing to  RV

## 2021-04-21 NOTE — Telephone Encounter (Signed)
Patient was trying to send this message through MyChart and could not get provider to pull up. Patient reports having sharp pains on right side by pelvic stomach area.  Patient uncertain of cause and reports having Colonoscopy 2 months ago, 02/11/21.  Patient reports that these symptoms started 1 week ago. Patient request call back to advise.

## 2021-04-29 ENCOUNTER — Other Ambulatory Visit: Payer: Self-pay | Admitting: Internal Medicine

## 2021-04-29 DIAGNOSIS — E034 Atrophy of thyroid (acquired): Secondary | ICD-10-CM

## 2021-04-29 DIAGNOSIS — E282 Polycystic ovarian syndrome: Secondary | ICD-10-CM

## 2021-05-12 ENCOUNTER — Encounter: Payer: Self-pay | Admitting: Internal Medicine

## 2021-05-13 ENCOUNTER — Telehealth: Payer: BC Managed Care – PPO | Admitting: Physician Assistant

## 2021-05-13 ENCOUNTER — Ambulatory Visit: Payer: Self-pay

## 2021-05-13 DIAGNOSIS — M545 Low back pain, unspecified: Secondary | ICD-10-CM | POA: Diagnosis not present

## 2021-05-13 MED ORDER — CYCLOBENZAPRINE HCL 10 MG PO TABS: 10.0000 mg | ORAL_TABLET | Freq: Three times a day (TID) | ORAL | 0 refills | Status: DC | PRN

## 2021-05-13 NOTE — Progress Notes (Signed)

## 2021-05-13 NOTE — Progress Notes (Signed)
I have spent 5 minutes in review of e-visit questionnaire, review and updating patient chart, medical decision making and response to patient.   Friedrich Harriott Cody Evangelyn Crouse, PA-C    

## 2021-05-17 ENCOUNTER — Encounter: Payer: Self-pay | Admitting: Internal Medicine

## 2021-05-17 ENCOUNTER — Ambulatory Visit (INDEPENDENT_AMBULATORY_CARE_PROVIDER_SITE_OTHER): Payer: BC Managed Care – PPO | Admitting: Internal Medicine

## 2021-05-17 ENCOUNTER — Other Ambulatory Visit: Payer: Self-pay

## 2021-05-17 VITALS — BP 132/80 | HR 90 | Ht 63.0 in | Wt 202.0 lb

## 2021-05-17 DIAGNOSIS — F411 Generalized anxiety disorder: Secondary | ICD-10-CM | POA: Diagnosis not present

## 2021-05-17 DIAGNOSIS — E034 Atrophy of thyroid (acquired): Secondary | ICD-10-CM | POA: Diagnosis not present

## 2021-05-17 DIAGNOSIS — I10 Essential (primary) hypertension: Secondary | ICD-10-CM

## 2021-05-17 DIAGNOSIS — K219 Gastro-esophageal reflux disease without esophagitis: Secondary | ICD-10-CM

## 2021-05-17 DIAGNOSIS — E282 Polycystic ovarian syndrome: Secondary | ICD-10-CM

## 2021-05-17 MED ORDER — SERTRALINE HCL 50 MG PO TABS: 50.0000 mg | ORAL_TABLET | Freq: Every day | ORAL | 1 refills | Status: DC

## 2021-05-17 MED ORDER — CLONAZEPAM 0.5 MG PO TABS: 0.5000 mg | ORAL_TABLET | Freq: Two times a day (BID) | ORAL | 0 refills | Status: DC | PRN

## 2021-05-17 NOTE — Progress Notes (Signed)
Date:  05/17/2021   Name:  Cindy Pena   DOB:  [birth year]   MRN:  [id number]   Chief Complaint: Anxiety and Hypertension  Anxiety Presents for initial visit. The problem has been gradually worsening. Symptoms include excessive worry, nervous/anxious behavior, palpitations and restlessness. Patient reports no chest pain, decreased concentration, dizziness or suicidal ideas. Symptoms occur most days. The severity of symptoms is interfering with daily activities. The symptoms are aggravated by social activities. The quality of sleep is fair.   There are no known risk factors. Treatments tried: CBT oil. Compliance with prior treatments has been good.  Hypertension This is a chronic problem. The problem is controlled. Associated symptoms include anxiety and palpitations. Pertinent negatives include no chest pain or headaches. Past treatments include ACE inhibitors. The current treatment provides significant improvement. Identifiable causes of hypertension include a thyroid problem.  Thyroid Problem Presents for follow-up visit. Symptoms include anxiety and palpitations. Patient reports no fatigue. The symptoms have been stable.    Lab Results  Component Value Date   CREATININE 0.90 02/07/2020   BUN 16 02/07/2020   NA 141 02/07/2020   K 4.3 02/07/2020   CL 103 02/07/2020   CO2 22 02/07/2020   Lab Results  Component Value Date   CHOL 219 (H) 02/07/2020   HDL 52 02/07/2020   LDLCALC 141 (H) 02/07/2020   TRIG 144 02/07/2020   CHOLHDL 4.2 02/07/2020   Lab Results  Component Value Date   TSH 0.834 02/07/2020   Lab Results  Component Value Date   HGBA1C 5.2 02/07/2020   Lab Results  Component Value Date   WBC 7.0 12/26/2019   HGB 14.6 12/26/2019   HCT 44.2 12/26/2019   MCV 82.3 12/26/2019   PLT 155 12/26/2019   Lab Results  Component Value Date   ALT 70 (H) 02/07/2020   AST 59 (H) 02/07/2020   ALKPHOS 136 (H) 02/07/2020   BILITOT 0.5 02/07/2020     Review  of Systems  Constitutional: Negative for chills, fatigue and fever.  Respiratory: Negative for cough, chest tightness, wheezing and stridor.   Cardiovascular: Positive for palpitations. Negative for chest pain and leg swelling.  Gastrointestinal: Negative for abdominal pain.  Neurological: Negative for dizziness and headaches.  Psychiatric/Behavioral: Negative for agitation, decreased concentration, dysphoric mood, sleep disturbance and suicidal ideas. The patient is nervous/anxious.     Patient Active Problem List   Diagnosis Date Noted  . Screening for colon cancer   . Heart palpitations 10/21/2020  . Hepatic steatosis 02/20/2019  . Mild hyperlipidemia 02/02/2019  . Hypothyroidism due to acquired atrophy of thyroid 02/01/2019  . Essential hypertension 05/21/2018  . Eczema 10/20/2017  . Morbid obesity with BMI of 40.0-44.9, adult (HCC) 04/19/2016  . Gastroesophageal reflux disease without esophagitis 07/08/2015  . PCOS (polycystic ovarian syndrome) 07/08/2015    Allergies  Allergen Reactions  . Darvon [Propoxyphene] Other (See Comments)    Chest Pain.  Marland Kitchen Penicillins Other (See Comments)    Migraines.  . Tramadol Hcl Rash  . Other Other (See Comments)    Past Surgical History:  Procedure Laterality Date  . c section  1996   September- SON Birth  . CHOLECYSTECTOMY  2003  . COLONOSCOPY WITH PROPOFOL N/A 02/11/2021   Procedure: COLONOSCOPY WITH PROPOFOL;  Surgeon: Toney Reil, MD;  Location: [facility] ENDOSCOPY;  Service: Gastroenterology;  Laterality: N/A;  . DILATION AND CURETTAGE OF UTERUS  1996 & 2006  . ooph Right 1995   Right tube  removed- mass was on tube.  Marland Kitchen TOTAL ABDOMINAL HYSTERECTOMY  2009   Right ovary attached to center of stomach.    Social History   Tobacco Use  . Smoking status: Never Smoker  . Smokeless tobacco: Never Used  Vaping Use  . Vaping Use: Never used  Substance Use Topics  . Alcohol use: No  . Drug use: No     Medication list has  been reviewed and updated.  Current Meds  Medication Sig  . docusate sodium (COLACE) 100 MG capsule Take 100 mg by mouth 2 (two) times daily.  Marland Kitchen levothyroxine (SYNTHROID) 125 MCG tablet TAKE 1 TABLET(125 MCG) BY MOUTH DAILY BEFORE AND BREAKFAST  . linaclotide (LINZESS) 145 MCG CAPS capsule TAKE 1 CAPSULE(145 MCG) BY MOUTH DAILY BEFORE BREAKFAST  . lisinopril (ZESTRIL) 10 MG tablet TAKE 1 TABLET(10 MG) BY MOUTH DAILY  . metFORMIN (GLUCOPHAGE) 500 MG tablet TAKE 1 TABLET(500 MG) BY MOUTH DAILY WITH BREAKFAST  . Misc Natural Products (ESTROVEN + ENERGY MAX STRENGTH PO) Take by mouth daily.  Marland Kitchen omeprazole (PRILOSEC) 20 MG capsule Take 1 capsule (20 mg total) by mouth daily.  . ondansetron (ZOFRAN ODT) 4 MG disintegrating tablet Take 1 tablet (4 mg total) by mouth every 8 (eight) hours as needed for nausea or vomiting.    PHQ 2/9 Scores 05/17/2021 10/21/2020 02/07/2020 02/01/2019  PHQ - 2 Score 5 0 0 1  PHQ- 9 Score 15 0 0 -    GAD 7 : Generalized Anxiety Score 05/17/2021 10/21/2020 02/07/2020  Nervous, Anxious, on Edge 3 2 2   Control/stop worrying 3 0 0  Worry too much - different things 3 0 0  Trouble relaxing 2 0 0  Restless 1 0 2  Easily annoyed or irritable 2 0 0  Afraid - awful might happen 3 0 2  Total GAD 7 Score 17 2 6   Anxiety Difficulty Very difficult Not difficult at all Very difficult    BP Readings from Last 3 Encounters:  05/17/21 132/80  03/15/21 137/84  02/11/21 (!) 144/75    Physical Exam Constitutional:      Appearance: Normal appearance.  Cardiovascular:     Rate and Rhythm: Normal rate and regular rhythm.     Pulses: Normal pulses.  Pulmonary:     Effort: Pulmonary effort is normal. No respiratory distress.     Breath sounds: No wheezing or rhonchi.  Musculoskeletal:     Cervical back: Normal range of motion.  Lymphadenopathy:     Cervical: No cervical adenopathy.  Skin:    General: Skin is warm and dry.  Neurological:     Mental Status: She is alert.   Psychiatric:        Attention and Perception: Attention normal.        Mood and Affect: Mood is anxious.        Speech: Speech normal.        Thought Content: Thought content does not include suicidal ideation. Thought content does not include suicidal plan.        Cognition and Memory: Cognition normal.     Wt Readings from Last 3 Encounters:  05/17/21 202 lb (91.6 kg)  03/15/21 198 lb (89.8 kg)  02/11/21 200 lb (90.7 kg)    BP 132/80   Pulse 90   Ht 5\' 3"  (1.6 m)   Wt 202 lb (91.6 kg)   LMP  (LMP Unknown)   SpO2 99%   BMI 35.78 kg/m   Assessment and Plan: 1. Generalized  anxiety disorder Will start Zoloft 50 mg q PM. Use clonazepam PRN panic attack sx Follow up in one month - clonazePAM (KLONOPIN) 0.5 MG tablet; Take 1 tablet (0.5 mg total) by mouth 2 (two) times daily as needed for anxiety.  Dispense: 20 tablet; Refill: 0 - sertraline (ZOLOFT) 50 MG tablet; Take 1 tablet (50 mg total) by mouth daily.  Dispense: 30 tablet; Refill: 1  2. Essential hypertension Clinically stable exam with well controlled BP. Tolerating medications without side effects at this time. Pt to continue current regimen and low sodium diet; benefits of regular exercise as able discussed. - Comprehensive metabolic panel  3. Gastroesophageal reflux disease without esophagitis Symptoms well controlled on daily PPI No red flag signs such as weight loss, n/v, melena Will continue omeprazole.  4. Hypothyroidism due to acquired atrophy of thyroid supplemented - TSH + free T4  5. PCOS (polycystic ovarian syndrome) Continue metformin - Hemoglobin A1c   Partially dictated using Animal nutritionist. Any errors are unintentional.  Bari Edward, MD [facility]  05/17/2021

## 2021-05-18 ENCOUNTER — Encounter: Payer: Self-pay | Admitting: Internal Medicine

## 2021-05-18 LAB — COMPREHENSIVE METABOLIC PANEL
ALT: 14 IU/L (ref 0–32)
AST: 18 IU/L (ref 0–40)
Albumin/Globulin Ratio: 1.9 (ref 1.2–2.2)
Albumin: 4.5 g/dL (ref 3.8–4.9)
Alkaline Phosphatase: 124 IU/L — ABNORMAL HIGH (ref 44–121)
BUN/Creatinine Ratio: 38 — ABNORMAL HIGH (ref 9–23)
BUN: 25 mg/dL — ABNORMAL HIGH (ref 6–24)
Bilirubin Total: 0.3 mg/dL (ref 0.0–1.2)
CO2: 23 mmol/L (ref 20–29)
Calcium: 9.9 mg/dL (ref 8.7–10.2)
Chloride: 103 mmol/L (ref 96–106)
Creatinine, Ser: 0.65 mg/dL (ref 0.57–1.00)
Globulin, Total: 2.4 g/dL (ref 1.5–4.5)
Glucose: 92 mg/dL (ref 65–99)
Potassium: 4.9 mmol/L (ref 3.5–5.2)
Sodium: 142 mmol/L (ref 134–144)
Total Protein: 6.9 g/dL (ref 6.0–8.5)
eGFR: 107 mL/min/{1.73_m2} (ref >59)

## 2021-05-18 LAB — TSH+FREE T4
Free T4: 1.65 ng/dL (ref 0.82–1.77)
TSH: 0.292 u[IU]/mL — ABNORMAL LOW (ref 0.450–4.500)

## 2021-05-18 LAB — HEMOGLOBIN A1C
Est. average glucose Bld gHb Est-mCnc: 100 mg/dL
Hgb A1c MFr Bld: 5.1 % (ref 4.8–5.6)

## 2021-05-24 ENCOUNTER — Telehealth: Payer: Self-pay | Admitting: Gastroenterology

## 2021-05-24 DIAGNOSIS — K5904 Chronic idiopathic constipation: Secondary | ICD-10-CM

## 2021-05-24 MED ORDER — LINACLOTIDE 145 MCG PO CAPS: ORAL_CAPSULE | ORAL | 0 refills | Status: DC

## 2021-05-24 NOTE — Telephone Encounter (Signed)
90 days  linaclotide (LINZESS) 145 MCG CAPS capsule Medication Date: 11/16/2020 Department: [facility] [place] Ordering/Authorizing: Toney Reil, MD    Order Providers  Prescribing Provider Encounter Provider  Vanga, Loel Dubonnet, MD Toney Reil, MD   Outpatient Medication Detail   Disp Refills Start End   linaclotide Karlene Einstein) 145 MCG CAPS capsule 90 capsule 1 11/16/2020    Sig: TAKE 1 CAPSULE(145 MCG) BY MOUTH DAILY BEFORE BREAKFAST   Sent to pharmacy as: linaclotide Karlene Einstein) 145 MCG Cap capsule   Notes to Pharmacy: Linzess copay card BIN [id number] PCN CN GRP [id number]. ID [id number]   E-Prescribing Status: Receipt confirmed by pharmacy (11/16/2020  9:39 AM EST)    Associated Diagnoses  Chronic idiopathic constipation      Pharmacy  [id number] - [place], [place] - [address] AT [facility] OF [address] [facility]   Additional Information  Associated Reports  View Encounter  Priority and Order Details

## 2021-05-24 NOTE — Telephone Encounter (Signed)
Last office visit 02/12/2020 Screening for colonoscopy and Constipation  Last refill 11/16/2020 1 refills  Patient has not been seen in over a year Called and made follow up appointment

## 2021-06-08 ENCOUNTER — Encounter: Payer: Self-pay | Admitting: Internal Medicine

## 2021-06-08 NOTE — Telephone Encounter (Signed)
Please review.  KP

## 2021-06-18 ENCOUNTER — Ambulatory Visit: Payer: BC Managed Care – PPO | Admitting: Internal Medicine

## 2021-06-20 ENCOUNTER — Telehealth: Payer: BC Managed Care – PPO | Admitting: Family Medicine

## 2021-06-20 DIAGNOSIS — M5431 Sciatica, right side: Secondary | ICD-10-CM

## 2021-06-20 MED ORDER — CYCLOBENZAPRINE HCL 10 MG PO TABS
10.0000 mg | ORAL_TABLET | Freq: Three times a day (TID) | ORAL | 0 refills | Status: DC | PRN
Start: 2021-06-20 — End: 2021-06-25

## 2021-06-20 MED ORDER — NAPROXEN 500 MG PO TABS: 500.0000 mg | ORAL_TABLET | Freq: Two times a day (BID) | ORAL | 0 refills | Status: DC

## 2021-06-20 NOTE — Progress Notes (Signed)
Ms. Cindy, Pena are scheduled for a virtual visit with your provider today.    Just as we do with appointments in the office, we must obtain your consent to participate.  Your consent will be active for this visit and any virtual visit you may have with one of our providers in the next 365 days.    If you have a MyChart account, I can also send a copy of this consent to you electronically.  All virtual visits are billed to your insurance company just like a traditional visit in the office.  As this is a virtual visit, video technology does not allow for your provider to perform a traditional examination.  This may limit your provider's ability to fully assess your condition.  If your provider identifies any concerns that need to be evaluated in person or the need to arrange testing such as labs, EKG, etc, we will make arrangements to do so.    Although advances in technology are sophisticated, we cannot ensure that it will always work on either your end or our end.  If the connection with a video visit is poor, we may have to switch to a telephone visit.  With either a video or telephone visit, we are not always able to ensure that we have a secure connection.   I need to obtain your verbal consent now.   Are you willing to proceed with your visit today?   Cindy Pena has provided verbal consent on 06/20/2021 for a virtual visit (video or telephone).  We are sorry that you are not feeling well.  Here is how we plan to help!  Based on what you have shared with me it looks like you mostly have sciatica/acute back pain  I have prescribed Naprosyn 500 mg take one by mouth twice a day non-steroid anti-inflammatory (NSAID) as well as Flexeril 10 mg every eight hours as needed which is a muscle relaxer  Some patients experience stomach irritation or in increased heartburn with anti-inflammatory drugs.  Please keep in mind that muscle relaxer's can cause fatigue and should not be taken while  at work or driving.  Back pain is very common.  The pain often gets better over time.  The cause of back pain is usually not dangerous.  Most people can learn to manage their back pain on their own.  Home Care Stay active.  Start with short walks on flat ground if you can.  Try to walk farther each day. Do not sit, drive or stand in one place for more than 30 minutes.  Do not stay in bed. Do not avoid exercise or work.  Activity can help your back heal faster. Be careful when you bend or lift an object.  Bend at your knees, keep the object close to you, and do not twist. Sleep on a firm mattress.  Lie on your side, and bend your knees.  If you lie on your back, put a pillow under your knees. Only take medicines as told by your doctor. Put ice on the injured area. Put ice in a plastic bag Place a towel between your skin and the bag Leave the ice on for 15-20 minutes, 3-4 times a day for the first 2-3 days. 210 After that, you can switch between ice and heat packs. Ask your doctor about back exercises or massage. Avoid feeling anxious or stressed.  Find good ways to deal with stress, such as exercise.  Get Help Right Way If: Your pain  does not go away with rest or medicine. Your pain does not go away in 1 week. You have new problems. You do not feel well. The pain spreads into your legs. You cannot control when you poop (bowel movement) or pee (urinate) You feel sick to your stomach (nauseous) or throw up (vomit) You have belly (abdominal) pain. You feel like you may pass out (faint). If you develop a fever.  Make Sure you: Understand these instructions. Will watch your condition Will get help right away if you are not doing well or get worse.  Your e-visit answers were reviewed by a board certified advanced clinical practitioner to complete your personal care plan.  Depending on the condition, your plan could have included both over the counter or prescription medications.  If there  is a problem please reply  once you have received a response from your provider.  Your safety is important to [place].  If you have drug allergies check your prescription carefully.    You can use MyChart to ask questions about today's visit, request a non-urgent call back, or ask for a work or school excuse for 24 hours related to this e-Visit. If it has been greater than 24 hours you will need to follow up with your provider, or enter a new e-Visit to address those concerns.  You will get an e-mail in the next two days asking about your experience.  I hope that your e-visit has been valuable and will speed your recovery. Thank you for using e-visits.  I provided 7 minutes of non face-to-face time during this encounter for chart review, medication and order placement, as well as and documentation.     Freddy Finner, NP 06/20/2021  10:34 AM

## 2021-06-25 ENCOUNTER — Other Ambulatory Visit: Payer: Self-pay

## 2021-06-25 ENCOUNTER — Encounter: Payer: Self-pay | Admitting: Internal Medicine

## 2021-06-25 ENCOUNTER — Ambulatory Visit (INDEPENDENT_AMBULATORY_CARE_PROVIDER_SITE_OTHER): Payer: BC Managed Care – PPO | Admitting: Internal Medicine

## 2021-06-25 VITALS — BP 138/84 | HR 67 | Temp 97.6°F | Ht 63.0 in | Wt 204.0 lb

## 2021-06-25 DIAGNOSIS — F411 Generalized anxiety disorder: Secondary | ICD-10-CM | POA: Diagnosis not present

## 2021-06-25 DIAGNOSIS — E669 Obesity, unspecified: Secondary | ICD-10-CM | POA: Diagnosis not present

## 2021-06-25 DIAGNOSIS — I10 Essential (primary) hypertension: Secondary | ICD-10-CM | POA: Diagnosis not present

## 2021-06-25 MED ORDER — SAXENDA 18 MG/3ML ~~LOC~~ SOPN
3.0000 mg | PEN_INJECTOR | Freq: Every day | SUBCUTANEOUS | 1 refills | Status: DC
Start: 2021-06-25 — End: 2021-07-19

## 2021-06-25 MED ORDER — SERTRALINE HCL 50 MG PO TABS: 50.0000 mg | ORAL_TABLET | Freq: Every day | ORAL | 1 refills | Status: DC

## 2021-06-25 NOTE — Progress Notes (Signed)
Date:  06/25/2021   Name:  Cindy Pena   DOB:  1971-10-10   MRN:  [id number]   Chief Complaint: Anxiety and Weight Gain (Wants to discuss weight loss medication. Tried Keto Diet, and exercise. Not helping. Seen a OBGYN who specialized in Weight Loss. She was on phentermine was could not continue to see her because insurance doesn't cover her services. )  Anxiety Presents for follow-up visit. Symptoms include nervous/anxious behavior (improved). Patient reports no chest pain, dizziness, palpitations or shortness of breath. Symptoms occur occasionally. The severity of symptoms is moderate (improving). The quality of sleep is good. Nighttime awakenings: occasional.   Obesity - previously treated with Phentermine by GYN last year and lost 40 lbs.  However, her insurance does not cover weight management.  She is interested in taking it again if I can prescribe. She continues on Keto diet but weight has not changed.  Lab Results  Component Value Date   CREATININE 0.65 05/17/2021   BUN 25 (H) 05/17/2021   NA 142 05/17/2021   K 4.9 05/17/2021   CL 103 05/17/2021   CO2 23 05/17/2021   Lab Results  Component Value Date   CHOL 219 (H) 02/07/2020   HDL 52 02/07/2020   LDLCALC 141 (H) 02/07/2020   TRIG 144 02/07/2020   CHOLHDL 4.2 02/07/2020   Lab Results  Component Value Date   TSH 0.292 (L) 05/17/2021   Lab Results  Component Value Date   HGBA1C 5.1 05/17/2021   Lab Results  Component Value Date   WBC 7.0 12/26/2019   HGB 14.6 12/26/2019   HCT 44.2 12/26/2019   MCV 82.3 12/26/2019   PLT 155 12/26/2019   Lab Results  Component Value Date   ALT 14 05/17/2021   AST 18 05/17/2021   ALKPHOS 124 (H) 05/17/2021   BILITOT 0.3 05/17/2021     Review of Systems  Constitutional:  Negative for fatigue and unexpected weight change.  HENT:  Negative for nosebleeds.   Eyes:  Negative for visual disturbance.  Respiratory:  Negative for cough, chest tightness, shortness of  breath and wheezing.   Cardiovascular:  Negative for chest pain, palpitations and leg swelling.  Gastrointestinal:  Negative for abdominal pain, constipation and diarrhea.  Neurological:  Negative for dizziness, weakness, light-headedness and headaches.  Psychiatric/Behavioral:  Positive for dysphoric mood (improved). Negative for sleep disturbance. The patient is nervous/anxious (improved).    Patient Active Problem List   Diagnosis Date Noted   Screening for colon cancer    Heart palpitations 10/21/2020   Hepatic steatosis 02/20/2019   Mild hyperlipidemia 02/02/2019   Hypothyroidism due to acquired atrophy of thyroid 02/01/2019   Essential hypertension 05/21/2018   Eczema 10/20/2017   Class 2 obesity with body mass index (BMI) of 35 to 39.9 without comorbidity 04/19/2016   Gastroesophageal reflux disease without esophagitis 07/08/2015   PCOS (polycystic ovarian syndrome) 07/08/2015    Allergies  Allergen Reactions   Darvon [Propoxyphene] Other (See Comments)    Chest Pain.   Penicillins Other (See Comments)    Migraines.   Tramadol Hcl Rash   Other Other (See Comments)    Past Surgical History:  Procedure Laterality Date   c section  1996   September- SON Birth   CHOLECYSTECTOMY  2003   COLONOSCOPY WITH PROPOFOL N/A 02/11/2021   Procedure: COLONOSCOPY WITH PROPOFOL;  Surgeon: Toney Reil, MD;  Location: [facility] ENDOSCOPY;  Service: Gastroenterology;  Laterality: N/A;   DILATION AND CURETTAGE OF UTERUS  1996 & 2006   ooph Right 1995   Right tube removed- mass was on tube.   TOTAL ABDOMINAL HYSTERECTOMY  2009   Right ovary attached to center of stomach.    Social History   Tobacco Use   Smoking status: Never   Smokeless tobacco: Never  Vaping Use   Vaping Use: Never used  Substance Use Topics   Alcohol use: No   Drug use: No     Medication list has been reviewed and updated.  Current Meds  Medication Sig   clonazePAM (KLONOPIN) 0.5 MG tablet Take 1  tablet (0.5 mg total) by mouth 2 (two) times daily as needed for anxiety.   docusate sodium (COLACE) 100 MG capsule Take 100 mg by mouth 2 (two) times daily.   levothyroxine (SYNTHROID) 125 MCG tablet TAKE 1 TABLET(125 MCG) BY MOUTH DAILY BEFORE AND BREAKFAST   linaclotide (LINZESS) 145 MCG CAPS capsule TAKE 1 CAPSULE(145 MCG) BY MOUTH DAILY BEFORE BREAKFAST   Liraglutide -Weight Management (SAXENDA) 18 MG/3ML SOPN Inject 3 mg into the skin daily at 6 (six) AM.   lisinopril (ZESTRIL) 10 MG tablet TAKE 1 TABLET(10 MG) BY MOUTH DAILY   metFORMIN (GLUCOPHAGE) 500 MG tablet TAKE 1 TABLET(500 MG) BY MOUTH DAILY WITH BREAKFAST   Misc Natural Products (ESTROVEN + ENERGY MAX STRENGTH PO) Take by mouth daily.   omeprazole (PRILOSEC) 20 MG capsule Take 1 capsule (20 mg total) by mouth daily.   [DISCONTINUED] sertraline (ZOLOFT) 50 MG tablet Take 1 tablet (50 mg total) by mouth daily.    PHQ 2/9 Scores 06/25/2021 05/17/2021 10/21/2020 02/07/2020  PHQ - 2 Score 0 5 0 0  PHQ- 9 Score 6 15 0 0    GAD 7 : Generalized Anxiety Score 06/25/2021 05/17/2021 10/21/2020 02/07/2020  Nervous, Anxious, on Edge 1 3 2 2   Control/stop worrying 1 3 0 0  Worry too much - different things 1 3 0 0  Trouble relaxing 0 2 0 0  Restless 0 1 0 2  Easily annoyed or irritable 0 2 0 0  Afraid - awful might happen 1 3 0 2  Total GAD 7 Score 4 17 2 6   Anxiety Difficulty Somewhat difficult Very difficult Not difficult at all Very difficult    BP Readings from Last 3 Encounters:  06/25/21 138/84  05/17/21 132/80  03/15/21 137/84    Physical Exam Vitals and nursing note reviewed.  Constitutional:      General: She is not in acute distress.    Appearance: She is well-developed. She is obese.  HENT:     Head: Normocephalic and atraumatic.  Cardiovascular:     Rate and Rhythm: Normal rate and regular rhythm.     Pulses: Normal pulses.     Heart sounds: No murmur heard. Pulmonary:     Effort: Pulmonary effort is normal. No  respiratory distress.     Breath sounds: No wheezing or rhonchi.  Musculoskeletal:     Cervical back: Normal range of motion.     Right lower leg: No edema.     Left lower leg: No edema.  Lymphadenopathy:     Cervical: No cervical adenopathy.  Skin:    General: Skin is warm and dry.     Findings: No rash.  Neurological:     Mental Status: She is alert and oriented to person, place, and time.  Psychiatric:        Mood and Affect: Mood normal.        Behavior:  Behavior normal.    Wt Readings from Last 3 Encounters:  06/25/21 204 lb (92.5 kg)  05/17/21 202 lb (91.6 kg)  03/15/21 198 lb (89.8 kg)    BP 138/84 (BP Location: Right Arm, Cuff Size: Large)   Pulse 67   Temp 97.6 F (36.4 C)   Ht 5\' 3"  (1.6 m)   Wt 204 lb (92.5 kg)   LMP  (LMP Unknown)   SpO2 98%   BMI 36.14 kg/m   Assessment and Plan: 1. Generalized anxiety disorder Improved sx scores and subjectively on Sertraline. Minimal side effects initially of loose stool that has resolved. Will continue same dose - consider increasing the dose if needed - sertraline (ZOLOFT) 50 MG tablet; Take 1 tablet (50 mg total) by mouth daily.  Dispense: 90 tablet; Refill: 1  2. Class 2 obesity with body mass index (BMI) of 35 to 39.9 without comorbidity She is not a candidate for Phentermine due to SSRI therapy and HTN. Saxenda discussed - will do PA if needed Begin 0.6 mg daily and increase weekly to max dose of 3 mg. Resume exercise; can continue Keto Follow up in 2 months - Liraglutide -Weight Management (SAXENDA) 18 MG/3ML SOPN; Inject 3 mg into the skin daily at 6 (six) AM.  Dispense: 15 mL; Refill: 1  3. Essential hypertension Clinically stable exam with well controlled BP. Tolerating medications without side effects at this time. Pt to continue current regimen and low sodium diet; benefits of regular exercise as able discussed.   Partially dictated using . Any errors are unintentional.  Animal nutritionist, MD [facility]  06/25/2021

## 2021-06-25 NOTE — Patient Instructions (Signed)
Saxenda  inject daily  0.6 mg x 1 w 1.2 mg x 1 w 1.8 mg x 1 w 2.4 mg x1 w Then 3 mg

## 2021-06-28 ENCOUNTER — Encounter: Payer: Self-pay | Admitting: Internal Medicine

## 2021-06-28 ENCOUNTER — Telehealth: Payer: Self-pay

## 2021-06-28 NOTE — Telephone Encounter (Signed)
Completed PA on [email or web address] for Saxenda. 36mL for 30days.   KEY: (Key: [id number])   PA - [id number]  Awaiting outcome from insurance company.

## 2021-06-30 ENCOUNTER — Encounter: Payer: Self-pay | Admitting: Gastroenterology

## 2021-06-30 ENCOUNTER — Other Ambulatory Visit: Payer: Self-pay

## 2021-06-30 ENCOUNTER — Ambulatory Visit (INDEPENDENT_AMBULATORY_CARE_PROVIDER_SITE_OTHER): Payer: BC Managed Care – PPO | Admitting: Gastroenterology

## 2021-06-30 VITALS — BP 125/71 | HR 75 | Temp 97.7°F | Ht 63.0 in | Wt 203.0 lb

## 2021-06-30 DIAGNOSIS — K5904 Chronic idiopathic constipation: Secondary | ICD-10-CM | POA: Diagnosis not present

## 2021-06-30 DIAGNOSIS — K760 Fatty (change of) liver, not elsewhere classified: Secondary | ICD-10-CM

## 2021-06-30 DIAGNOSIS — K219 Gastro-esophageal reflux disease without esophagitis: Secondary | ICD-10-CM | POA: Diagnosis not present

## 2021-06-30 DIAGNOSIS — R748 Abnormal levels of other serum enzymes: Secondary | ICD-10-CM | POA: Diagnosis not present

## 2021-06-30 NOTE — Progress Notes (Signed)
Cindy Repress, MD [address]  [address]  [place], [place] [address]  Main: [phone]  Fax: [phone]    Gastroenterology Consultation  Referring Provider:     [name], MD Primary Care Physician:  Cindy Milan, MD Primary Gastroenterologist:  Dr. Arlyss Pena Reason for Consultation: Chronic constipation and rectal pain, fatty liver        HPI:   Cindy Pena is a 50 y.o. female referred by Dr. Judithann Graves, Nyoka Cowden, MD  for consultation & management of new onset of diarrhea that started on day of Christmas, nonbloody, watery bowel movements up to 6 times a day, mostly postprandial, develops abdominal cramps 30 minutes after eating with bowel urgency.  She went to ER on 1/14 because of diarrhea.  CBC, lipase were unremarkable.  LFTs revealed mildly elevated transaminases.  Stool studies were recommended but were not done as she could not produce any stool.  She received IV fluids.  Since then, she reports that her diarrhea has improved as she has been avoiding sodas and fatty foods, modified her diet.  Currently, having up to 3 bowel movements a day which are loose.  However, she has developed severe rectal pain, sharp, tearing type which is worse with bowel movement and last for about 30 to 60 minutes after BM.  Her most concern is the rectal pain.  She has been using hydrocortisone suppositories with not much relief.  Prior to these symptoms, she reports having constipation, going about 2 times a week, requiring stool softener. Her lifestyle is sedentary, known history of fatty liver with elevated transaminases since 10/2017, right upper current ultrasound revealed fatty liver in 2020. Patient is on Metformin for history of PCOS She is on Synthroid for history of hypothyroidism  Follow-up visit 02/12/2020 Patient reports that her rectal pain has resolved.  She did not use topical nitroglycerin.  She is no longer experiencing diarrhea after dietary  modification.  In fact, she was experiencing constipation, I started her on Linzess 145 MCG.  Her main concern today is intermittent early morning nausea and epigastric discomfort, particularly when she misses the breakfast.  She wanted to discuss about screening colonoscopy as recommended by her PCP.  Follow-up visit 06/30/2021 Patient is here for follow-up of chronic constipation.  She is currently maintained on Linzess 145 MCG daily along with MiraLAX 17 g daily which keeps her bowel movements regular and she denies any pushing or straining.  She had flareup of rectal pain from severe anal spasm in March and she applied nitroglycerin ointment for 1 week which relieved her symptoms.  Her GERD symptoms are under control on omeprazole 20 mg daily.  Patient reports that she has [facility] and she was started on phentermine.  This has led to 50 to 60 pounds weight loss and she has been maintaining it.  Her PCP has switched to [place], waiting for insurance approval because she is on Zoloft, she did not want to continue phentermine due to interaction.  She is on metformin for PCOS.  She continues to have elevated alkaline phosphatase.  Her transaminases are normal.  Free T4 is normal, TSH is low  NSAIDs: None  Antiplts/Anticoagulants/Anti thrombotics: None  GI Procedures: None She denies family history of GI malignancy  Past Medical History:  Diagnosis Date   Anxiety    Depression    Fatty liver    GERD (gastroesophageal reflux disease)  Hypertension    Hypothyroidism    Obesity, Class III, BMI 40-49.9 (morbid obesity) (HCC)    PCOS (polycystic ovarian syndrome)    Thyroid disease     Past Surgical History:  Procedure Laterality Date   c section  1996   September- SON Birth   CHOLECYSTECTOMY  2003   COLONOSCOPY WITH PROPOFOL N/A 02/11/2021   Procedure: COLONOSCOPY WITH PROPOFOL;  Surgeon: Toney Reil, MD;  Location: [facility]  ENDOSCOPY;  Service: Gastroenterology;  Laterality: N/A;   DILATION AND CURETTAGE OF UTERUS  1996 & 2006   ooph Right 1995   Right tube removed- mass was on tube.   TOTAL ABDOMINAL HYSTERECTOMY  2009   Right ovary attached to center of stomach.    Current Outpatient Medications:    docusate sodium (COLACE) 100 MG capsule, Take 100 mg by mouth 2 (two) times daily., Disp: , Rfl:    levothyroxine (SYNTHROID) 125 MCG tablet, TAKE 1 TABLET(125 MCG) BY MOUTH DAILY BEFORE AND BREAKFAST, Disp: 90 tablet, Rfl: 0   linaclotide (LINZESS) 145 MCG CAPS capsule, TAKE 1 CAPSULE(145 MCG) BY MOUTH DAILY BEFORE BREAKFAST, Disp: 90 capsule, Rfl: 0   Liraglutide -Weight Management (SAXENDA) 18 MG/3ML SOPN, Inject 3 mg into the skin daily at 6 (six) AM., Disp: 15 mL, Rfl: 1   lisinopril (ZESTRIL) 10 MG tablet, TAKE 1 TABLET(10 MG) BY MOUTH DAILY, Disp: 90 tablet, Rfl: 0   metFORMIN (GLUCOPHAGE) 500 MG tablet, TAKE 1 TABLET(500 MG) BY MOUTH DAILY WITH BREAKFAST, Disp: 90 tablet, Rfl: 0   Misc Natural Products (ESTROVEN + ENERGY MAX STRENGTH PO), Take by mouth daily., Disp: , Rfl:    omeprazole (PRILOSEC) 20 MG capsule, Take 1 capsule (20 mg total) by mouth daily., Disp: 90 capsule, Rfl: 1   sertraline (ZOLOFT) 50 MG tablet, Take 1 tablet (50 mg total) by mouth daily., Disp: 90 tablet, Rfl: 1   clonazePAM (KLONOPIN) 0.5 MG tablet, Take 1 tablet (0.5 mg total) by mouth 2 (two) times daily as needed for anxiety. (Patient not taking: Reported on 06/30/2021), Disp: 20 tablet, Rfl: 0   naproxen (NAPROSYN) 500 MG tablet, Take 1 tablet (500 mg total) by mouth 2 (two) times daily with a meal. (Patient not taking: No sig reported), Disp: 30 tablet, Rfl: 0   ondansetron (ZOFRAN ODT) 4 MG disintegrating tablet, Take 1 tablet (4 mg total) by mouth every 8 (eight) hours as needed for nausea or vomiting. (Patient not taking: No sig reported), Disp: 20 tablet, Rfl: 0    Family History  Problem Relation Age of Onset    Hypertension Father    Prostate cancer Father    Hypertension Sister    Cancer Paternal Aunt    Breast cancer Paternal Aunt      Social History   Tobacco Use   Smoking status: Never   Smokeless tobacco: Never  Vaping Use   Vaping Use: Never used  Substance Use Topics   Alcohol use: No   Drug use: No    Allergies as of 06/30/2021 - Review Complete 06/30/2021  Allergen Reaction Noted   Darvon [propoxyphene] Other (See Comments) 05/31/2017   Penicillins Other (See Comments) 05/31/2017   Tramadol hcl Rash 05/31/2017   Other Other (See Comments) 12/25/2018    Review of Systems:    All systems reviewed and negative except where noted in HPI.   Physical Exam:  BP 125/71 (BP Location: Left Arm, Patient Position: Sitting, Cuff Size: Normal)   Pulse 75   Temp 97.7  F (36.5 C) (Oral)   Ht 5\' 3"  (1.6 m)   Wt 203 lb (92.1 kg)   LMP  (LMP Unknown)   BMI 35.96 kg/m  No LMP recorded (lmp unknown). Patient has had a hysterectomy.  General:   Alert,  Well-developed, well-nourished, pleasant and cooperative in NAD Head:  Normocephalic and atraumatic. Eyes:  Sclera clear, no icterus.   Conjunctiva pink. Ears:  Normal auditory acuity. Nose:  No deformity, discharge, or lesions. Mouth:  No deformity or lesions,oropharynx pink & moist. Neck:  Supple; no masses or thyromegaly. Lungs:  Respirations even and unlabored.  Clear throughout to auscultation.   No wheezes, crackles, or rhonchi. No acute distress. Heart:  Regular rate and rhythm; no murmurs, clicks, rubs, or gallops. Abdomen:  Normal bowel sounds. Soft, morbidly obese, non-tender and non-distended without masses or hernias noted.  No guarding or rebound tenderness.   Rectal: Not performed Msk:  Symmetrical without gross deformities. Good, equal movement & strength bilaterally. Pulses:  Normal pulses noted. Extremities:  No clubbing or edema.  No cyanosis. Neurologic:  Alert and oriented x3;  grossly normal  neurologically. Skin:  Intact without significant lesions or rashes. No jaundice. Psych:  Alert and cooperative. Normal mood and affect.  Imaging Studies: Reviewed  Assessment and Plan:   JAXIE RACANELLI is a 50 y.o. female with morbid obesity, PCOS, hypothyroidism is seen for follow-up of nonalcoholic fatty liver disease, chronic constipation, and chronic GERD Diarrhea has resolved since last visit after discontinuation of carbonated beverages and sugary drinks.  Rectal pain has resolved  Constipation Continue Linzess 145 MCG daily Continue MiraLAX 17 g daily Reiterated on high-fiber diet, fiber supplements Adequate intake of water  Nonalcoholic fatty liver disease based on ultrasound, elevated LFTs Negative for hepatitis A, B and C Patient was on phentermine for weight loss that has resulted in approximately 50 pounds and is currently maintaining the same weight.  Awaiting for approval of Saxenda Alkaline phosphatase is still elevated, therefore recommend rest of the secondary liver disease work-up Check vitamin D levels  History of GERD Currently maintained on omeprazole 20 mg daily She is following antireflux lifestyle with symptoms under control   Follow up in 6 months   44, MD

## 2021-07-01 ENCOUNTER — Other Ambulatory Visit: Payer: Self-pay | Admitting: Internal Medicine

## 2021-07-01 ENCOUNTER — Telehealth: Payer: Self-pay

## 2021-07-01 DIAGNOSIS — E559 Vitamin D deficiency, unspecified: Secondary | ICD-10-CM

## 2021-07-01 DIAGNOSIS — F411 Generalized anxiety disorder: Secondary | ICD-10-CM

## 2021-07-01 NOTE — Telephone Encounter (Signed)
Please review. Last office visit 06/25/2021.  KP

## 2021-07-01 NOTE — Telephone Encounter (Signed)
Requested medication (s) are due for refill today: yes  Requested medication (s) are on the active medication list: yes   Last refill:  05/17/2021  Future visit scheduled:  yes  Notes to clinic:  this refill cannot be delegated    Requested Prescriptions  Pending Prescriptions Disp Refills   clonazePAM (KLONOPIN) 0.5 MG tablet [Pharmacy Med Name: CLONAZEPAM 0.5MG  TABLETS] 20 tablet     Sig: TAKE 1 TABLET(0.5 MG) BY MOUTH TWICE DAILY AS NEEDED FOR ANXIETY      Not Delegated - Psychiatry:  Anxiolytics/Hypnotics Failed - 07/01/2021  7:36 AM      Failed - This refill cannot be delegated      Failed - Urine Drug Screen completed in last 360 days      Passed - Valid encounter within last 6 months    Recent Outpatient Visits           6 days ago Generalized anxiety disorder   [facility] [name], MD   1 month ago Generalized anxiety disorder   [facility] [name], MD   8 months ago Essential hypertension   [facility] [name], MD   1 year ago Annual physical exam   [facility] [name], MD   2 years ago Essential hypertension   [facility] [name], MD       Future Appointments             In 1 month Judithann Graves, Nyoka Cowden, MD [facility], [facility]

## 2021-07-01 NOTE — Telephone Encounter (Signed)
Called pt she stated that at her GI appt she had not taken them in a few days. Pt stated she takes them as needed but only has about 3 tablets left. Pt said she just wanted a refill so when she has an anxiety attack she will have some medication to take for it.  KP

## 2021-07-01 NOTE — Telephone Encounter (Signed)
Per Dr. Allegra Lai can we add vitamin D levels to her labs from 06/30/21?Marland Kitchen  Called customer service for [employer] and they are going to try to add on the blood work to the labs if they have enough blood. They will fax over a form to sign if they have enough blood.

## 2021-07-02 NOTE — Telephone Encounter (Signed)
Message from plan: Product is a plan exclusion.  PA DENIED.  Patient informed.

## 2021-07-05 ENCOUNTER — Encounter: Payer: Self-pay | Admitting: Gastroenterology

## 2021-07-05 LAB — SPECIMEN STATUS REPORT

## 2021-07-05 LAB — VITAMIN D 25 HYDROXY (VIT D DEFICIENCY, FRACTURES): Vit D, 25-Hydroxy: 23.1 ng/mL — ABNORMAL LOW (ref 30.0–100.0)

## 2021-07-06 ENCOUNTER — Encounter: Payer: Self-pay | Admitting: Gastroenterology

## 2021-07-09 ENCOUNTER — Encounter: Payer: Self-pay | Admitting: Internal Medicine

## 2021-07-09 DIAGNOSIS — F411 Generalized anxiety disorder: Secondary | ICD-10-CM | POA: Insufficient documentation

## 2021-07-10 LAB — FERRITIN: Ferritin: 165 ng/mL — ABNORMAL HIGH (ref 15–150)

## 2021-07-10 LAB — ANA: ANA Titer 1: NEGATIVE

## 2021-07-10 LAB — CERULOPLASMIN: Ceruloplasmin: 27.3 mg/dL (ref 19.0–39.0)

## 2021-07-10 LAB — ALPHA-1-ANTITRYPSIN: A-1 Antitrypsin: 129 mg/dL (ref 101–187)

## 2021-07-10 LAB — ANTI-SMOOTH MUSCLE ANTIBODY, IGG: Smooth Muscle Ab: 7 Units (ref 0–19)

## 2021-07-10 LAB — MITOCHONDRIAL ANTIBODIES: Mitochondrial Ab: <20 Units (ref 0.0–20.0)

## 2021-07-16 ENCOUNTER — Other Ambulatory Visit: Payer: Self-pay | Admitting: Internal Medicine

## 2021-07-16 ENCOUNTER — Encounter: Payer: Self-pay | Admitting: Internal Medicine

## 2021-07-16 DIAGNOSIS — E282 Polycystic ovarian syndrome: Secondary | ICD-10-CM

## 2021-07-16 DIAGNOSIS — E034 Atrophy of thyroid (acquired): Secondary | ICD-10-CM

## 2021-07-19 ENCOUNTER — Encounter: Payer: Self-pay | Admitting: Internal Medicine

## 2021-07-19 ENCOUNTER — Other Ambulatory Visit: Payer: Self-pay

## 2021-07-19 ENCOUNTER — Other Ambulatory Visit: Payer: Self-pay | Admitting: Internal Medicine

## 2021-07-19 DIAGNOSIS — E669 Obesity, unspecified: Secondary | ICD-10-CM

## 2021-07-19 MED ORDER — PHENTERMINE HCL 37.5 MG PO CAPS: 37.5000 mg | ORAL_CAPSULE | ORAL | 0 refills | Status: DC

## 2021-07-20 ENCOUNTER — Other Ambulatory Visit: Payer: Self-pay

## 2021-07-20 ENCOUNTER — Other Ambulatory Visit: Payer: Self-pay | Admitting: Internal Medicine

## 2021-07-20 ENCOUNTER — Encounter: Payer: Self-pay | Admitting: Gastroenterology

## 2021-07-20 DIAGNOSIS — E669 Obesity, unspecified: Secondary | ICD-10-CM

## 2021-07-20 MED ORDER — PHENTERMINE HCL 37.5 MG PO CAPS: 37.5000 mg | ORAL_CAPSULE | ORAL | 0 refills | Status: DC

## 2021-07-21 ENCOUNTER — Ambulatory Visit: Payer: BC Managed Care – PPO | Admitting: Internal Medicine

## 2021-07-25 ENCOUNTER — Encounter: Payer: Self-pay | Admitting: Internal Medicine

## 2021-08-11 ENCOUNTER — Other Ambulatory Visit: Payer: Self-pay | Admitting: Internal Medicine

## 2021-08-11 DIAGNOSIS — K219 Gastro-esophageal reflux disease without esophagitis: Secondary | ICD-10-CM

## 2021-08-14 ENCOUNTER — Other Ambulatory Visit: Payer: Self-pay | Admitting: Gastroenterology

## 2021-08-14 DIAGNOSIS — K5904 Chronic idiopathic constipation: Secondary | ICD-10-CM

## 2021-08-18 ENCOUNTER — Other Ambulatory Visit: Payer: Self-pay | Admitting: Internal Medicine

## 2021-08-18 DIAGNOSIS — E669 Obesity, unspecified: Secondary | ICD-10-CM

## 2021-08-18 NOTE — Telephone Encounter (Signed)
Requested medication (s) are due for refill today: yes   Requested medication (s) are on the active medication list: yes  Last refill:  07/20/21 #30  Future visit scheduled: yes  Notes to clinic:  Please review for refill. Refill not delegated.     Requested Prescriptions  Pending Prescriptions Disp Refills   phentermine 37.5 MG capsule [Pharmacy Med Name: PHENTERMINE 37.5MG  CAPSULES] 30 capsule     Sig: TAKE 1 CAPSULE(37.5 MG) BY MOUTH EVERY MORNING     Not Delegated - Gastroenterology:  Antiobesity Agents Failed - 08/18/2021 10:16 AM      Failed - This refill cannot be delegated      Passed - Last BP in normal range    BP Readings from Last 1 Encounters:  06/30/21 125/71          Passed - Last Heart Rate in normal range    Pulse Readings from Last 1 Encounters:  06/30/21 75          Passed - Valid encounter within last 12 months    Recent Outpatient Visits           1 month ago Generalized anxiety disorder   [facility] [name], MD   3 months ago Generalized anxiety disorder   [facility] [name], MD   10 months ago Essential hypertension   [facility] [name], MD   1 year ago Annual physical exam   [facility] [name], MD   2 years ago Essential hypertension   [facility] [name], MD       Future Appointments             In 1 week Judithann Graves Nyoka Cowden, MD [facility], [facility]

## 2021-08-19 ENCOUNTER — Encounter: Payer: Self-pay | Admitting: Internal Medicine

## 2021-08-19 ENCOUNTER — Other Ambulatory Visit: Payer: Self-pay | Admitting: Internal Medicine

## 2021-08-19 DIAGNOSIS — E669 Obesity, unspecified: Secondary | ICD-10-CM

## 2021-08-19 MED ORDER — PHENTERMINE HCL 37.5 MG PO CAPS: 37.5000 mg | ORAL_CAPSULE | Freq: Every morning | ORAL | 0 refills | Status: DC

## 2021-08-27 ENCOUNTER — Ambulatory Visit: Payer: BC Managed Care – PPO | Admitting: Internal Medicine

## 2021-09-17 ENCOUNTER — Other Ambulatory Visit: Payer: Self-pay

## 2021-09-17 ENCOUNTER — Encounter: Payer: Self-pay | Admitting: Internal Medicine

## 2021-09-17 ENCOUNTER — Ambulatory Visit (INDEPENDENT_AMBULATORY_CARE_PROVIDER_SITE_OTHER): Payer: BC Managed Care – PPO | Admitting: Internal Medicine

## 2021-09-17 VITALS — BP 118/82 | HR 87 | Temp 97.8°F | Ht 63.0 in | Wt 200.0 lb

## 2021-09-17 DIAGNOSIS — I10 Essential (primary) hypertension: Secondary | ICD-10-CM

## 2021-09-17 DIAGNOSIS — F411 Generalized anxiety disorder: Secondary | ICD-10-CM | POA: Diagnosis not present

## 2021-09-17 DIAGNOSIS — E669 Obesity, unspecified: Secondary | ICD-10-CM | POA: Diagnosis not present

## 2021-09-17 MED ORDER — PHENTERMINE HCL 37.5 MG PO CAPS: 37.5000 mg | ORAL_CAPSULE | Freq: Every morning | ORAL | 1 refills | Status: DC

## 2021-09-17 MED ORDER — TOPIRAMATE 25 MG PO TABS: 25.0000 mg | ORAL_TABLET | Freq: Two times a day (BID) | ORAL | 1 refills | Status: DC

## 2021-09-17 NOTE — Progress Notes (Signed)
Date:  09/17/2021   Name:  Cindy Pena   DOB:  09-20-1971   MRN:  [id number]   Chief Complaint: Hypertension  Hypertension This is a chronic problem. The problem is controlled. Associated symptoms include anxiety. Pertinent negatives include no chest pain, headaches, palpitations or shortness of breath. Past treatments include ACE inhibitors.  Anxiety Presents for follow-up visit. Patient reports no chest pain, dizziness, palpitations or shortness of breath. Symptoms occur occasionally. The severity of symptoms is moderate. The quality of sleep is good.   Compliance with medications: weaned off zoloft, using CBD oil PRN.   Obesity - did not have much success using Phentermine.  She continues on keto diet and exercise.  Bernie Covey was not covered.  She has never taken topiromate.  Lab Results  Component Value Date   CREATININE 0.65 05/17/2021   BUN 25 (H) 05/17/2021   NA 142 05/17/2021   K 4.9 05/17/2021   CL 103 05/17/2021   CO2 23 05/17/2021   Lab Results  Component Value Date   CHOL 219 (H) 02/07/2020   HDL 52 02/07/2020   LDLCALC 141 (H) 02/07/2020   TRIG 144 02/07/2020   CHOLHDL 4.2 02/07/2020   Lab Results  Component Value Date   TSH 0.292 (L) 05/17/2021   Lab Results  Component Value Date   HGBA1C 5.1 05/17/2021   Lab Results  Component Value Date   WBC 7.0 12/26/2019   HGB 14.6 12/26/2019   HCT 44.2 12/26/2019   MCV 82.3 12/26/2019   PLT 155 12/26/2019   Lab Results  Component Value Date   ALT 14 05/17/2021   AST 18 05/17/2021   ALKPHOS 124 (H) 05/17/2021   BILITOT 0.3 05/17/2021     Review of Systems  Constitutional:  Negative for fatigue and unexpected weight change.  HENT:  Negative for nosebleeds.   Eyes:  Negative for visual disturbance.  Respiratory:  Negative for cough, chest tightness, shortness of breath and wheezing.   Cardiovascular:  Negative for chest pain, palpitations and leg swelling.  Gastrointestinal:  Negative for  abdominal pain, constipation and diarrhea.  Neurological:  Negative for dizziness, weakness, light-headedness and headaches.   Patient Active Problem List   Diagnosis Date Noted   Generalized anxiety disorder 07/09/2021   Heart palpitations 10/21/2020   Hepatic steatosis 02/20/2019   Mild hyperlipidemia 02/02/2019   Hypothyroidism due to acquired atrophy of thyroid 02/01/2019   Essential hypertension 05/21/2018   Eczema 10/20/2017   Class 2 obesity with body mass index (BMI) of 35 to 39.9 without comorbidity 04/19/2016   Gastroesophageal reflux disease without esophagitis 07/08/2015   PCOS (polycystic ovarian syndrome) 07/08/2015    Allergies  Allergen Reactions   Darvon [Propoxyphene] Other (See Comments)    Chest Pain.   Penicillins Other (See Comments)    Migraines.   Tramadol Hcl Rash   Other Other (See Comments)    Past Surgical History:  Procedure Laterality Date   c section  1996   September- SON Birth   CHOLECYSTECTOMY  2003   COLONOSCOPY WITH PROPOFOL N/A 02/11/2021   Procedure: COLONOSCOPY WITH PROPOFOL;  Surgeon: Toney Reil, MD;  Location: [facility] ENDOSCOPY;  Service: Gastroenterology;  Laterality: N/A;   DILATION AND CURETTAGE OF UTERUS  1996 & 2006   ooph Right 1995   Right tube removed- mass was on tube.   TOTAL ABDOMINAL HYSTERECTOMY  2009   Right ovary attached to center of stomach.    Social History   Tobacco Use  Smoking status: Never   Smokeless tobacco: Never  Vaping Use   Vaping Use: Never used  Substance Use Topics   Alcohol use: No   Drug use: No     Medication list has been reviewed and updated.  Current Meds  Medication Sig   docusate sodium (COLACE) 100 MG capsule Take 100 mg by mouth 2 (two) times daily.   levothyroxine (SYNTHROID) 125 MCG tablet TAKE 1 TABLET(125 MCG) BY MOUTH DAILY BEFORE AND BREAKFAST   linaclotide (LINZESS) 145 MCG CAPS capsule TAKE 1 CAPSULE(145 MCG) BY MOUTH DAILY BEFORE AND BREAKFAST   lisinopril  (ZESTRIL) 10 MG tablet TAKE 1 TABLET(10 MG) BY MOUTH DAILY   metFORMIN (GLUCOPHAGE) 500 MG tablet TAKE 1 TABLET(500 MG) BY MOUTH DAILY WITH BREAKFAST   Misc Natural Products (ESTROVEN + ENERGY MAX STRENGTH PO) Take by mouth daily.   omeprazole (PRILOSEC) 20 MG capsule TAKE 1 CAPSULE(20 MG) BY MOUTH DAILY   ondansetron (ZOFRAN ODT) 4 MG disintegrating tablet Take 1 tablet (4 mg total) by mouth every 8 (eight) hours as needed for nausea or vomiting.   phentermine 37.5 MG capsule Take 1 capsule (37.5 mg total) by mouth in the morning.    PHQ 2/9 Scores 09/17/2021 06/25/2021 05/17/2021 10/21/2020  PHQ - 2 Score 2 0 5 0  PHQ- 9 Score 3 6 15  0    GAD 7 : Generalized Anxiety Score 09/17/2021 06/25/2021 05/17/2021 10/21/2020  Nervous, Anxious, on Edge 1 1 3 2   Control/stop worrying 1 1 3  0  Worry too much - different things 1 1 3  0  Trouble relaxing 1 0 2 0  Restless 0 0 1 0  Easily annoyed or irritable 0 0 2 0  Afraid - awful might happen 1 1 3  0  Total GAD 7 Score 5 4 17 2   Anxiety Difficulty - Somewhat difficult Very difficult Not difficult at all    BP Readings from Last 3 Encounters:  09/17/21 118/82  06/30/21 125/71  06/25/21 138/84    Physical Exam Vitals and nursing note reviewed.  Constitutional:      General: She is not in acute distress.    Appearance: She is well-developed.  HENT:     Head: Normocephalic and atraumatic.  Pulmonary:     Effort: Pulmonary effort is normal. No respiratory distress.  Skin:    General: Skin is warm and dry.     Findings: No rash.  Neurological:     Mental Status: She is alert and oriented to person, place, and time.  Psychiatric:        Mood and Affect: Mood normal.        Behavior: Behavior normal.    Wt Readings from Last 3 Encounters:  09/17/21 200 lb (90.7 kg)  06/30/21 203 lb (92.1 kg)  06/25/21 204 lb (92.5 kg)    BP 118/82   Pulse 87   Temp 97.8 F (36.6 C) (Oral)   Ht 5\' 3"  (1.6 m)   Wt 200 lb (90.7 kg)   LMP  (LMP  Unknown)   SpO2 99%   BMI 35.43 kg/m   Assessment and Plan: 1. Essential hypertension Clinically stable exam with well controlled BP. Tolerating medications without side effects at this time. Pt to continue current regimen and low sodium diet; benefits of regular exercise as able discussed.  2. Class 2 obesity with body mass index (BMI) of 35 to 39.9 without comorbidity Will try phentermine in combination with topiramate both taken regularly for the next 2  months. Continue exercise regularly, keto diet  - topiramate (TOPAMAX) 25 MG tablet; Take 1 tablet (25 mg total) by mouth 2 (two) times daily.  Dispense: 60 tablet; Refill: 1 - phentermine 37.5 MG capsule; Take 1 capsule (37.5 mg total) by mouth in the morning.  Dispense: 30 capsule; Refill: 1  3. Generalized anxiety disorder Doing well since stopping Zoloft Using CBD oil PRN   Partially dictated using Animal nutritionist. Any errors are unintentional.  Bari Edward, MD [facility]  09/17/2021

## 2021-10-14 ENCOUNTER — Other Ambulatory Visit: Payer: Self-pay | Admitting: Internal Medicine

## 2021-10-14 DIAGNOSIS — E282 Polycystic ovarian syndrome: Secondary | ICD-10-CM

## 2021-10-14 NOTE — Telephone Encounter (Signed)
Requested Prescriptions  Pending Prescriptions Disp Refills  . metFORMIN (GLUCOPHAGE) 500 MG tablet [Pharmacy Med Name: METFORMIN $RemoveBeforeD'500MG'[id number]$  TABLETS] 90 tablet 0    Sig: TAKE 1 TABLET(500 MG) BY MOUTH DAILY WITH BREAKFAST     Endocrinology:  Diabetes - Biguanides Passed - 10/14/2021  3:19 AM      Passed - Cr in normal range and within 360 days    Creatinine, Ser  Date Value Ref Range Status  05/17/2021 0.65 0.57 - 1.00 mg/dL Final         Passed - HBA1C is between 0 and 7.9 and within 180 days    Hgb A1c MFr Bld  Date Value Ref Range Status  05/17/2021 5.1 4.8 - 5.6 % Final    Comment:             Prediabetes: 5.7 - 6.4          Diabetes: >6.4          Glycemic control for adults with diabetes: <7.0          Passed - eGFR in normal range and within 360 days    GFR calc Af Amer  Date Value Ref Range Status  02/07/2020 87 >59 mL/min/1.73 Final   GFR calc non Af Amer  Date Value Ref Range Status  02/07/2020 75 >59 mL/min/1.73 Final   eGFR  Date Value Ref Range Status  05/17/2021 107 >59 mL/min/1.73 Final         Passed - Valid encounter within last 6 months    Recent Outpatient Visits          3 weeks ago Essential hypertension   [facility] Glean Hess, MD   3 months ago Generalized anxiety disorder   [facility] Glean Hess, MD   5 months ago Generalized anxiety disorder   [facility] Glean Hess, MD   11 months ago Essential hypertension   [facility] Glean Hess, MD   1 year ago Annual physical exam   [facility] Glean Hess, MD      Future Appointments            In 1 month Army Melia Jesse Sans, MD [facility] [place]           . lisinopril (ZESTRIL) 10 MG tablet [Pharmacy Med Name: LISINOPRIL $RemoveBeforeDE'10MG'[id number]$  TABLETS] 90 tablet 0    Sig: TAKE 1 TABLET(10 MG) BY MOUTH DAILY     Cardiovascular:  ACE Inhibitors Passed - 10/14/2021  3:19 AM      Passed - Cr in normal range and within 180  days    Creatinine, Ser  Date Value Ref Range Status  05/17/2021 0.65 0.57 - 1.00 mg/dL Final         Passed - K in normal range and within 180 days    Potassium  Date Value Ref Range Status  05/17/2021 4.9 3.5 - 5.2 mmol/L Final         Passed - Patient is not pregnant      Passed - Last BP in normal range    BP Readings from Last 1 Encounters:  09/17/21 118/82         Passed - Valid encounter within last 6 months    Recent Outpatient Visits          3 weeks ago Essential hypertension   [place], [place]   3 months ago Generalized anxiety disorder  [facility] Glean Hess, MD   5 months ago Generalized anxiety disorder   [facility] Glean Hess, MD   11 months ago Essential hypertension   [facility] Glean Hess, MD   1 year ago Annual physical exam   [facility] Glean Hess, MD      Future Appointments            In 1 month Army Melia Jesse Sans, MD [facility], [facility]

## 2021-10-15 ENCOUNTER — Other Ambulatory Visit: Payer: Self-pay | Admitting: Internal Medicine

## 2021-10-15 DIAGNOSIS — E034 Atrophy of thyroid (acquired): Secondary | ICD-10-CM

## 2021-10-15 MED ORDER — LEVOTHYROXINE SODIUM 125 MCG PO TABS: 125.0000 ug | ORAL_TABLET | Freq: Every day | ORAL | 0 refills | Status: DC

## 2021-10-19 ENCOUNTER — Telehealth: Payer: BC Managed Care – PPO | Admitting: Physician Assistant

## 2021-10-19 DIAGNOSIS — J029 Acute pharyngitis, unspecified: Secondary | ICD-10-CM

## 2021-10-19 MED ORDER — PREDNISONE 10 MG PO TABS: 30.0000 mg | ORAL_TABLET | Freq: Every day | ORAL | 0 refills | Status: DC

## 2021-10-19 NOTE — Progress Notes (Signed)
Message sent to patient requesting further input regarding current symptoms. Awaiting patient response.  

## 2021-10-19 NOTE — Progress Notes (Signed)
I have spent 5 minutes in review of e-visit questionnaire, review and updating patient chart, medical decision making and response to patient.   Kiaira Pointer Cody Nakoa Ganus, PA-C    

## 2021-10-19 NOTE — Progress Notes (Signed)
E-Visit for Sore Throat  We are sorry that you are not feeling well.  Here is how we plan to help!  Your symptoms indicate a likely viral infection (Pharyngitis).   Pharyngitis is inflammation in the back of the throat which can cause a sore throat, scratchiness and sometimes difficulty swallowing.   Pharyngitis is typically caused by a respiratory virus and will just run its course.  Please keep in mind that your symptoms could last up to 10 days.  For throat pain, we recommend over the counter oral pain relief medications such as acetaminophen or aspirin, or anti-inflammatory medications such as ibuprofen or naproxen sodium.  Topical treatments such as oral throat lozenges or sprays may be used as needed.  Avoid close contact with loved ones, especially the very young and elderly.  Remember to wash your hands thoroughly throughout the day as this is the number one way to prevent the spread of infection and wipe down door knobs and counters with disinfectant.  After careful review of your answers, I would not recommend and antibiotic for your condition.  Antibiotics should not be used to treat conditions that we suspect are caused by viruses like the virus that causes the common cold or flu. However, some people can have Strep with atypical symptoms. You may need formal testing in clinic or office to confirm if your symptoms continue or worsen.  Providers prescribe antibiotics to treat infections caused by bacteria. Antibiotics are very powerful in treating bacterial infections when they are used properly.  To maintain their effectiveness, they should be used only when necessary.  Overuse of antibiotics has resulted in the development of super bugs that are resistant to treatment!    I am sending in a low-dose steroid to take for a couple of days to reduce the inflammation and pain in the throat.   Home Care: Only take medications as instructed by your medical team. Do not drink alcohol while taking  these medications. A steam or ultrasonic humidifier can help congestion.  You can place a towel over your head and breathe in the steam from hot water coming from a faucet. Avoid close contacts especially the very young and the elderly. Cover your mouth when you cough or sneeze. Always remember to wash your hands.  Get Help Right Away If: You develop worsening fever or throat pain. You develop a severe head ache or visual changes. Your symptoms persist after you have completed your treatment plan.  Make sure you Understand these instructions. Will watch your condition. Will get help right away if you are not doing well or get worse.   Thank you for choosing an e-visit.  Your e-visit answers were reviewed by a board certified advanced clinical practitioner to complete your personal care plan. Depending upon the condition, your plan could have included both over the counter or prescription medications.  Please review your pharmacy choice. Make sure the pharmacy is open so you can pick up prescription now. If there is a problem, you may contact your provider through [employer] and have the prescription routed to another pharmacy.  Your safety is important to [place]. If you have drug allergies check your prescription carefully.   For the next 24 hours you can use MyChart to ask questions about today's visit, request a non-urgent call back, or ask for a work or school excuse. You will get an email in the next two days asking about your experience. I hope that your e-visit has been valuable and  will speed your recovery.

## 2021-10-22 ENCOUNTER — Encounter: Payer: Self-pay | Admitting: Internal Medicine

## 2021-10-22 ENCOUNTER — Ambulatory Visit (INDEPENDENT_AMBULATORY_CARE_PROVIDER_SITE_OTHER): Payer: BC Managed Care – PPO | Admitting: Internal Medicine

## 2021-10-22 ENCOUNTER — Other Ambulatory Visit: Payer: Self-pay

## 2021-10-22 VITALS — BP 136/84 | HR 79 | Temp 98.2°F | Ht 63.0 in | Wt 199.2 lb

## 2021-10-22 DIAGNOSIS — J069 Acute upper respiratory infection, unspecified: Secondary | ICD-10-CM | POA: Diagnosis not present

## 2021-10-22 MED ORDER — PROMETHAZINE-DM 6.25-15 MG/5ML PO SYRP: 5.0000 mL | ORAL_SOLUTION | Freq: Four times a day (QID) | ORAL | 0 refills | Status: DC | PRN

## 2021-10-22 MED ORDER — AZITHROMYCIN 250 MG PO TABS: ORAL_TABLET | ORAL | 0 refills | Status: AC

## 2021-10-22 NOTE — Progress Notes (Signed)
Date:  10/22/2021   Name:  Cindy Pena   DOB:  05-Jan-1971   MRN:  [id number]   Chief Complaint: Sinusitis  Cough This is a new problem. The current episode started in the past 7 days. The problem has been gradually worsening. The cough is Productive of sputum. Associated symptoms include chest pain, chills, nasal congestion, postnasal drip, rhinorrhea and a sore throat. Pertinent negatives include no ear pain, fever, shortness of breath or sweats. Heartburn: clear and thick. Patient took 2 Covid Tests at Home and they were both Negative- One today and one Tuesday of this week.  Lab Results  Component Value Date   CREATININE 0.65 05/17/2021   BUN 25 (H) 05/17/2021   NA 142 05/17/2021   K 4.9 05/17/2021   CL 103 05/17/2021   CO2 23 05/17/2021   Lab Results  Component Value Date   CHOL 219 (H) 02/07/2020   HDL 52 02/07/2020   LDLCALC 141 (H) 02/07/2020   TRIG 144 02/07/2020   CHOLHDL 4.2 02/07/2020   Lab Results  Component Value Date   TSH 0.292 (L) 05/17/2021   Lab Results  Component Value Date   HGBA1C 5.1 05/17/2021   Lab Results  Component Value Date   WBC 7.0 12/26/2019   HGB 14.6 12/26/2019   HCT 44.2 12/26/2019   MCV 82.3 12/26/2019   PLT 155 12/26/2019   Lab Results  Component Value Date   ALT 14 05/17/2021   AST 18 05/17/2021   ALKPHOS 124 (H) 05/17/2021   BILITOT 0.3 05/17/2021     Review of Systems  Constitutional:  Positive for chills. Negative for fever.  HENT:  Positive for postnasal drip, rhinorrhea and sore throat. Negative for ear pain.   Respiratory:  Positive for cough and chest tightness. Negative for shortness of breath.   Cardiovascular:  Positive for chest pain.  Gastrointestinal:  Heartburn: clear and thick.   Patient Active Problem List   Diagnosis Date Noted   Generalized anxiety disorder 07/09/2021   Heart palpitations 10/21/2020   Hepatic steatosis 02/20/2019   Mild hyperlipidemia 02/02/2019   Hypothyroidism due  to acquired atrophy of thyroid 02/01/2019   Essential hypertension 05/21/2018   Eczema 10/20/2017   Class 2 obesity with body mass index (BMI) of 35 to 39.9 without comorbidity 04/19/2016   Gastroesophageal reflux disease without esophagitis 07/08/2015   PCOS (polycystic ovarian syndrome) 07/08/2015    Allergies  Allergen Reactions   Darvon [Propoxyphene] Other (See Comments)    Chest Pain.   Penicillins Other (See Comments)    Migraines.   Tramadol Hcl Rash   Other Other (See Comments)    Past Surgical History:  Procedure Laterality Date   c section  1996   September- SON Birth   CHOLECYSTECTOMY  2003   COLONOSCOPY WITH PROPOFOL N/A 02/11/2021   Procedure: COLONOSCOPY WITH PROPOFOL;  Surgeon: Toney Reil, MD;  Location: [facility] ENDOSCOPY;  Service: Gastroenterology;  Laterality: N/A;   DILATION AND CURETTAGE OF UTERUS  1996 & 2006   ooph Right 1995   Right tube removed- mass was on tube.   TOTAL ABDOMINAL HYSTERECTOMY  2009   Right ovary attached to center of stomach.    Social History   Tobacco Use   Smoking status: Never   Smokeless tobacco: Never  Vaping Use   Vaping Use: Never used  Substance Use Topics   Alcohol use: No   Drug use: No     Medication list has been reviewed  and updated.  Current Meds  Medication Sig   azithromycin (ZITHROMAX Z-PAK) 250 MG tablet UAD   docusate sodium (COLACE) 100 MG capsule Take 100 mg by mouth 2 (two) times daily.   levothyroxine (SYNTHROID) 125 MCG tablet Take 1 tablet (125 mcg total) by mouth daily before breakfast.   linaclotide (LINZESS) 145 MCG CAPS capsule TAKE 1 CAPSULE(145 MCG) BY MOUTH DAILY BEFORE AND BREAKFAST   lisinopril (ZESTRIL) 10 MG tablet TAKE 1 TABLET(10 MG) BY MOUTH DAILY   metFORMIN (GLUCOPHAGE) 500 MG tablet TAKE 1 TABLET(500 MG) BY MOUTH DAILY WITH BREAKFAST   Misc Natural Products (ESTROVEN + ENERGY MAX STRENGTH PO) Take by mouth daily.   omeprazole (PRILOSEC) 20 MG capsule TAKE 1 CAPSULE(20 MG)  BY MOUTH DAILY   ondansetron (ZOFRAN ODT) 4 MG disintegrating tablet Take 1 tablet (4 mg total) by mouth every 8 (eight) hours as needed for nausea or vomiting.   promethazine-dextromethorphan (PROMETHAZINE-DM) 6.25-15 MG/5ML syrup Take 5 mLs by mouth 4 (four) times daily as needed for cough.    PHQ 2/9 Scores 09/17/2021 06/25/2021 05/17/2021 10/21/2020  PHQ - 2 Score 2 0 5 0  PHQ- 9 Score 3 6 15  0    GAD 7 : Generalized Anxiety Score 09/17/2021 06/25/2021 05/17/2021 10/21/2020  Nervous, Anxious, on Edge 1 1 3 2   Control/stop worrying 1 1 3  0  Worry too much - different things 1 1 3  0  Trouble relaxing 1 0 2 0  Restless 0 0 1 0  Easily annoyed or irritable 0 0 2 0  Afraid - awful might happen 1 1 3  0  Total GAD 7 Score 5 4 17 2   Anxiety Difficulty - Somewhat difficult Very difficult Not difficult at all    BP Readings from Last 3 Encounters:  10/22/21 136/84  09/17/21 118/82  06/30/21 125/71    Physical Exam Vitals and nursing note reviewed.  Constitutional:      General: She is not in acute distress.    Appearance: Normal appearance. She is well-developed.  HENT:     Head: Normocephalic and atraumatic.     Right Ear: Tympanic membrane is retracted.     Left Ear: Tympanic membrane is retracted.     Nose:     Right Sinus: No maxillary sinus tenderness or frontal sinus tenderness.     Left Sinus: No maxillary sinus tenderness or frontal sinus tenderness.     Mouth/Throat:     Pharynx: Posterior oropharyngeal erythema present.     Tonsils: No tonsillar exudate.  Cardiovascular:     Rate and Rhythm: Normal rate and regular rhythm.     Pulses: Normal pulses.     Heart sounds: No murmur heard. Pulmonary:     Effort: Pulmonary effort is normal. No respiratory distress.  Musculoskeletal:     Cervical back: Normal range of motion.  Lymphadenopathy:     Cervical: No cervical adenopathy.  Skin:    General: Skin is warm and dry.     Findings: No rash.  Neurological:     Mental  Status: She is alert and oriented to person, place, and time.  Psychiatric:        Mood and Affect: Mood normal.        Behavior: Behavior normal.    Wt Readings from Last 3 Encounters:  10/22/21 199 lb 3.2 oz (90.4 kg)  09/17/21 200 lb (90.7 kg)  06/30/21 203 lb (92.1 kg)    BP 136/84   Pulse 79   Temp  98.2 F (36.8 C) (Oral)   Ht 5\' 3"  (1.6 m)   Wt 199 lb 3.2 oz (90.4 kg)   LMP  (LMP Unknown)   SpO2 99%   BMI 35.29 kg/m   Assessment and Plan: 1. Upper respiratory tract infection, unspecified type Continue Mucinex-D; pick up steroid taper prescribed by E-visit MD Rest and fluids - azithromycin (ZITHROMAX Z-PAK) 250 MG tablet; UAD  Dispense: 6 each; Refill: 0 - promethazine-dextromethorphan (PROMETHAZINE-DM) 6.25-15 MG/5ML syrup; Take 5 mLs by mouth 4 (four) times daily as needed for cough.  Dispense: 180 mL; Refill: 0   Partially dictated using 02-13-1991. Any errors are unintentional.  Animal nutritionist, MD [facility]  10/22/2021

## 2021-11-17 ENCOUNTER — Ambulatory Visit: Payer: BC Managed Care – PPO | Admitting: Internal Medicine

## 2021-11-21 ENCOUNTER — Other Ambulatory Visit: Payer: Self-pay | Admitting: Internal Medicine

## 2021-11-21 DIAGNOSIS — E669 Obesity, unspecified: Secondary | ICD-10-CM

## 2021-11-21 NOTE — Telephone Encounter (Signed)
Requested medication (s) are due for refill today: yes  Requested medication (s) are on the active medication list: yes  Last refill:  09/17/21 #60 1 RF  Future visit scheduled: no  Notes to clinic:  med not delegated to NT to RF   Requested Prescriptions  Pending Prescriptions Disp Refills   topiramate (TOPAMAX) 25 MG tablet [Pharmacy Med Name: TOPIRAMATE 25MG  TABLETS] 60 tablet 1    Sig: TAKE 1 TABLET(25 MG) BY MOUTH TWICE DAILY     Not Delegated - Neurology: Anticonvulsants - topiramate & zonisamide Failed - 11/21/2021  1:40 PM      Failed - This refill cannot be delegated      Passed - Cr in normal range and within 360 days    Creatinine, Ser  Date Value Ref Range Status  05/17/2021 0.65 0.57 - 1.00 mg/dL Final          Passed - CO2 in normal range and within 360 days    CO2  Date Value Ref Range Status  05/17/2021 23 20 - 29 mmol/L Final          Passed - Valid encounter within last 12 months    Recent Outpatient Visits           1 month ago Upper respiratory tract infection, unspecified type   [facility], MD   2 months ago Essential hypertension   [facility], MD   4 months ago Generalized anxiety disorder   [facility], MD   6 months ago Generalized anxiety disorder   [facility], [place]   1 year ago Essential hypertension   [facility], MD

## 2021-11-22 ENCOUNTER — Encounter: Payer: Self-pay | Admitting: Internal Medicine

## 2021-12-08 ENCOUNTER — Encounter: Payer: Self-pay | Admitting: Internal Medicine

## 2021-12-20 ENCOUNTER — Encounter: Payer: Self-pay | Admitting: Internal Medicine

## 2021-12-22 ENCOUNTER — Encounter: Payer: Self-pay | Admitting: Internal Medicine

## 2021-12-22 ENCOUNTER — Ambulatory Visit (INDEPENDENT_AMBULATORY_CARE_PROVIDER_SITE_OTHER): Payer: BC Managed Care – PPO | Admitting: Internal Medicine

## 2021-12-22 ENCOUNTER — Other Ambulatory Visit: Payer: Self-pay

## 2021-12-22 VITALS — BP 112/78 | HR 82 | Ht 63.0 in | Wt 193.6 lb

## 2021-12-22 DIAGNOSIS — R42 Dizziness and giddiness: Secondary | ICD-10-CM

## 2021-12-22 MED ORDER — MECLIZINE HCL 25 MG PO TABS: 12.5000 mg | ORAL_TABLET | Freq: Three times a day (TID) | ORAL | 0 refills | Status: DC | PRN

## 2021-12-22 NOTE — Progress Notes (Signed)
Date:  12/22/2021   Name:  Cindy Pena   DOB:  November 10, 1971   MRN:  [id number]   Chief Complaint: Dizziness  Dizziness This is a new problem. The current episode started today. The problem occurs constantly. Associated symptoms include vertigo. Pertinent negatives include no chest pain, chills, coughing, fatigue, fever, headaches, nausea, numbness or weakness. The symptoms are aggravated by standing and walking.  She had Covid 2 weeks ago - stayed home and rested.  Most of those sx have resolved. She took zofran this am with possible some benefit.  Lab Results  Component Value Date   NA 142 05/17/2021   K 4.9 05/17/2021   CO2 23 05/17/2021   GLUCOSE 92 05/17/2021   BUN 25 (H) 05/17/2021   CREATININE 0.65 05/17/2021   CALCIUM 9.9 05/17/2021   EGFR 107 05/17/2021   GFRNONAA 75 02/07/2020   Lab Results  Component Value Date   CHOL 219 (H) 02/07/2020   HDL 52 02/07/2020   LDLCALC 141 (H) 02/07/2020   TRIG 144 02/07/2020   CHOLHDL 4.2 02/07/2020   Lab Results  Component Value Date   TSH 0.292 (L) 05/17/2021   Lab Results  Component Value Date   HGBA1C 5.1 05/17/2021   Lab Results  Component Value Date   WBC 7.0 12/26/2019   HGB 14.6 12/26/2019   HCT 44.2 12/26/2019   MCV 82.3 12/26/2019   PLT 155 12/26/2019   Lab Results  Component Value Date   ALT 14 05/17/2021   AST 18 05/17/2021   ALKPHOS 124 (H) 05/17/2021   BILITOT 0.3 05/17/2021   Lab Results  Component Value Date   VD25OH 23.1 (L) 06/30/2021     Review of Systems  Constitutional:  Negative for chills, fatigue and fever.  Respiratory:  Negative for cough, chest tightness and shortness of breath.   Cardiovascular:  Negative for chest pain.  Gastrointestinal:  Negative for nausea.  Neurological:  Positive for dizziness, vertigo and light-headedness. Negative for syncope, weakness, numbness and headaches.   Patient Active Problem List   Diagnosis Date Noted   Generalized anxiety disorder  07/09/2021   Heart palpitations 10/21/2020   Hepatic steatosis 02/20/2019   Mild hyperlipidemia 02/02/2019   Hypothyroidism due to acquired atrophy of thyroid 02/01/2019   Essential hypertension 05/21/2018   Eczema 10/20/2017   Class 2 obesity with body mass index (BMI) of 35 to 39.9 without comorbidity 04/19/2016   Gastroesophageal reflux disease without esophagitis 07/08/2015   PCOS (polycystic ovarian syndrome) 07/08/2015    Allergies  Allergen Reactions   Darvon [Propoxyphene] Other (See Comments)    Chest Pain.   Penicillins Other (See Comments)    Migraines.   Tramadol Hcl Rash   Other Other (See Comments)    Past Surgical History:  Procedure Laterality Date   c section  1996   September- SON Birth   CHOLECYSTECTOMY  2003   COLONOSCOPY WITH PROPOFOL N/A 02/11/2021   Procedure: COLONOSCOPY WITH PROPOFOL;  Surgeon: Lin Landsman, MD;  Location: [facility] ENDOSCOPY;  Service: Gastroenterology;  Laterality: N/A;   DILATION AND CURETTAGE OF UTERUS  1996 & 2006   ooph Right 1995   Right tube removed- mass was on tube.   TOTAL ABDOMINAL HYSTERECTOMY  2009   Right ovary attached to center of stomach.    Social History   Tobacco Use   Smoking status: Never   Smokeless tobacco: Never  Vaping Use   Vaping Use: Never used  Substance Use Topics  Alcohol use: No   Drug use: No     Medication list has been reviewed and updated.  Current Meds  Medication Sig   docusate sodium (COLACE) 100 MG capsule Take 100 mg by mouth 2 (two) times daily.   levothyroxine (SYNTHROID) 125 MCG tablet Take 1 tablet (125 mcg total) by mouth daily before breakfast.   linaclotide (LINZESS) 145 MCG CAPS capsule TAKE 1 CAPSULE(145 MCG) BY MOUTH DAILY BEFORE AND BREAKFAST   lisinopril (ZESTRIL) 10 MG tablet TAKE 1 TABLET(10 MG) BY MOUTH DAILY   meclizine (ANTIVERT) 25 MG tablet Take 0.5 tablets (12.5 mg total) by mouth 3 (three) times daily as needed for dizziness.   metFORMIN (GLUCOPHAGE) 500  MG tablet TAKE 1 TABLET(500 MG) BY MOUTH DAILY WITH BREAKFAST   Misc Natural Products (ESTROVEN + ENERGY MAX STRENGTH PO) Take by mouth daily.   omeprazole (PRILOSEC) 20 MG capsule TAKE 1 CAPSULE(20 MG) BY MOUTH DAILY   ondansetron (ZOFRAN ODT) 4 MG disintegrating tablet Take 1 tablet (4 mg total) by mouth every 8 (eight) hours as needed for nausea or vomiting.   topiramate (TOPAMAX) 25 MG tablet TAKE 1 TABLET(25 MG) BY MOUTH TWICE DAILY    PHQ 2/9 Scores 12/22/2021 09/17/2021 06/25/2021 05/17/2021  PHQ - 2 Score 1 2 0 5  PHQ- 9 Score 4 3 6 15     GAD 7 : Generalized Anxiety Score 12/22/2021 09/17/2021 06/25/2021 05/17/2021  Nervous, Anxious, on Edge 2 1 1 3   Control/stop worrying 1 1 1 3   Worry too much - different things 1 1 1 3   Trouble relaxing 0 1 0 2  Restless 0 0 0 1  Easily annoyed or irritable 0 0 0 2  Afraid - awful might happen 0 1 1 3   Total GAD 7 Score 4 5 4 17   Anxiety Difficulty Not difficult at all - Somewhat difficult Very difficult    BP Readings from Last 3 Encounters:  12/22/21 112/78  10/22/21 136/84  09/17/21 118/82    Physical Exam Vitals and nursing note reviewed.  Constitutional:      General: She is not in acute distress.    Appearance: Normal appearance. She is well-developed.  HENT:     Head: Normocephalic and atraumatic.     Right Ear: Tympanic membrane and ear canal normal.     Left Ear: Tympanic membrane and ear canal normal.     Nose:     Right Sinus: No maxillary sinus tenderness or frontal sinus tenderness.     Left Sinus: No maxillary sinus tenderness or frontal sinus tenderness.  Eyes:     Extraocular Movements:     Right eye: Nystagmus present.     Left eye: Nystagmus present.  Cardiovascular:     Rate and Rhythm: Normal rate and regular rhythm.     Pulses: Normal pulses.     Heart sounds: No murmur heard. Pulmonary:     Effort: Pulmonary effort is normal. No respiratory distress.     Breath sounds: No wheezing or rhonchi.  Skin:     General: Skin is warm and dry.     Capillary Refill: Capillary refill takes less than 2 seconds.     Findings: No rash.  Neurological:     Mental Status: She is alert and oriented to person, place, and time.  Psychiatric:        Mood and Affect: Mood normal.        Behavior: Behavior normal.    Wt Readings from Last 3  Encounters:  12/22/21 193 lb 9.6 oz (87.8 kg)  10/22/21 199 lb 3.2 oz (90.4 kg)  09/17/21 200 lb (90.7 kg)    BP 112/78    Pulse 82    Ht $R'5\' 3"'UI$  (1.6 m)    Wt 193 lb 9.6 oz (87.8 kg)    LMP  (LMP Unknown)    SpO2 98%    BMI 34.29 kg/m   Assessment and Plan: 1. Vertigo Likely sinus congestion vs post-Covid Supportive care with fluids; use antivert PRN - meclizine (ANTIVERT) 25 MG tablet; Take 0.5 tablets (12.5 mg total) by mouth 3 (three) times daily as needed for dizziness.  Dispense: 30 tablet; Refill: 0   Partially dictated using [employer], [employer]. Any errors are unintentional.  Halina Maidens, MD [facility]  12/22/2021

## 2022-01-02 ENCOUNTER — Telehealth: Payer: BC Managed Care – PPO | Admitting: Nurse Practitioner

## 2022-01-02 DIAGNOSIS — M545 Low back pain, unspecified: Secondary | ICD-10-CM

## 2022-01-02 MED ORDER — NAPROXEN 500 MG PO TABS: 500.0000 mg | ORAL_TABLET | Freq: Two times a day (BID) | ORAL | 0 refills | Status: DC

## 2022-01-02 MED ORDER — CYCLOBENZAPRINE HCL 10 MG PO TABS
10.0000 mg | ORAL_TABLET | Freq: Three times a day (TID) | ORAL | 0 refills | Status: DC | PRN
Start: 2022-01-02 — End: 2022-01-11

## 2022-01-02 NOTE — Progress Notes (Signed)

## 2022-01-02 NOTE — Progress Notes (Signed)
I have spent 5 minutes in review of e-visit questionnaire, review and updating patient chart, medical decision making and response to patient.  ° °Cindy Makris W Cortlin Marano, NP ° °  °

## 2022-01-03 ENCOUNTER — Encounter: Payer: Self-pay | Admitting: Internal Medicine

## 2022-01-03 NOTE — Telephone Encounter (Signed)
Please review.  KP

## 2022-01-11 ENCOUNTER — Other Ambulatory Visit: Payer: Self-pay

## 2022-01-11 ENCOUNTER — Ambulatory Visit (INDEPENDENT_AMBULATORY_CARE_PROVIDER_SITE_OTHER): Payer: BC Managed Care – PPO | Admitting: Internal Medicine

## 2022-01-11 ENCOUNTER — Encounter: Payer: Self-pay | Admitting: Internal Medicine

## 2022-01-11 VITALS — BP 136/82 | HR 77 | Ht 63.0 in | Wt 198.0 lb

## 2022-01-11 DIAGNOSIS — I10 Essential (primary) hypertension: Secondary | ICD-10-CM | POA: Diagnosis not present

## 2022-01-11 DIAGNOSIS — E669 Obesity, unspecified: Secondary | ICD-10-CM | POA: Diagnosis not present

## 2022-01-11 MED ORDER — PHENTERMINE HCL 37.5 MG PO TABS: 37.5000 mg | ORAL_TABLET | Freq: Every day | ORAL | 1 refills | Status: DC

## 2022-01-11 NOTE — Progress Notes (Signed)
Date:  01/11/2022   Name:  Cindy Pena   DOB:  December 18, 1970   MRN:  [id number]   Chief Complaint: Weight Check (Gained 5 pounds since last visit )  Hypertension This is a chronic problem. The problem is controlled. Pertinent negatives include no chest pain, headaches, palpitations or shortness of breath. Past treatments include ACE inhibitors.   Weight loss medication follow up  - started Phentermine plus topiramate in October.  Weight was 200 lbs.  She was unable to come for planned follow up due to Covid infection.  She has refills on Topiramate but not phentermine.  Initially did lose 7 lbs. Currently not on any medication or diet supplement. She felt that she did well on the combo and lost about 5 lbs in 6 weeks.    Lab Results  Component Value Date   NA 142 05/17/2021   K 4.9 05/17/2021   CO2 23 05/17/2021   GLUCOSE 92 05/17/2021   BUN 25 (H) 05/17/2021   CREATININE 0.65 05/17/2021   CALCIUM 9.9 05/17/2021   EGFR 107 05/17/2021   GFRNONAA 75 02/07/2020   Lab Results  Component Value Date   CHOL 219 (H) 02/07/2020   HDL 52 02/07/2020   LDLCALC 141 (H) 02/07/2020   TRIG 144 02/07/2020   CHOLHDL 4.2 02/07/2020   Lab Results  Component Value Date   TSH 0.292 (L) 05/17/2021   Lab Results  Component Value Date   HGBA1C 5.1 05/17/2021   Lab Results  Component Value Date   WBC 7.0 12/26/2019   HGB 14.6 12/26/2019   HCT 44.2 12/26/2019   MCV 82.3 12/26/2019   PLT 155 12/26/2019   Lab Results  Component Value Date   ALT 14 05/17/2021   AST 18 05/17/2021   ALKPHOS 124 (H) 05/17/2021   BILITOT 0.3 05/17/2021   Lab Results  Component Value Date   VD25OH 23.1 (L) 06/30/2021     Review of Systems  Constitutional:  Negative for fatigue and unexpected weight change.  HENT:  Negative for nosebleeds.   Eyes:  Negative for visual disturbance.  Respiratory:  Negative for cough, chest tightness, shortness of breath and wheezing.   Cardiovascular:   Negative for chest pain, palpitations and leg swelling.  Gastrointestinal:  Negative for abdominal pain, constipation and diarrhea.  Neurological:  Negative for dizziness, weakness, light-headedness and headaches.   Patient Active Problem List   Diagnosis Date Noted   Generalized anxiety disorder 07/09/2021   Heart palpitations 10/21/2020   Hepatic steatosis 02/20/2019   Mild hyperlipidemia 02/02/2019   Hypothyroidism due to acquired atrophy of thyroid 02/01/2019   Essential hypertension 05/21/2018   Eczema 10/20/2017   Class 2 obesity with body mass index (BMI) of 35 to 39.9 without comorbidity 04/19/2016   Gastroesophageal reflux disease without esophagitis 07/08/2015   PCOS (polycystic ovarian syndrome) 07/08/2015    Allergies  Allergen Reactions   Darvon [Propoxyphene] Other (See Comments)    Chest Pain.   Penicillins Other (See Comments)    Migraines.   Tramadol Hcl Rash   Other Other (See Comments)    Past Surgical History:  Procedure Laterality Date   c section  1996   September- SON Birth   CHOLECYSTECTOMY  2003   COLONOSCOPY WITH PROPOFOL N/A 02/11/2021   Procedure: COLONOSCOPY WITH PROPOFOL;  Surgeon: Toney Reil, MD;  Location: [facility] ENDOSCOPY;  Service: Gastroenterology;  Laterality: N/A;   DILATION AND CURETTAGE OF UTERUS  1996 & 2006   ooph Right  1995   Right tube removed- mass was on tube.   TOTAL ABDOMINAL HYSTERECTOMY  2009   Right ovary attached to center of stomach.    Social History   Tobacco Use   Smoking status: Never   Smokeless tobacco: Never  Vaping Use   Vaping Use: Never used  Substance Use Topics   Alcohol use: No   Drug use: No     Medication list has been reviewed and updated.  Current Meds  Medication Sig   docusate sodium (COLACE) 100 MG capsule Take 100 mg by mouth 2 (two) times daily.   levothyroxine (SYNTHROID) 125 MCG tablet Take 1 tablet (125 mcg total) by mouth daily before breakfast.   linaclotide (LINZESS) 145  MCG CAPS capsule TAKE 1 CAPSULE(145 MCG) BY MOUTH DAILY BEFORE AND BREAKFAST   lisinopril (ZESTRIL) 10 MG tablet TAKE 1 TABLET(10 MG) BY MOUTH DAILY   metFORMIN (GLUCOPHAGE) 500 MG tablet TAKE 1 TABLET(500 MG) BY MOUTH DAILY WITH BREAKFAST   Misc Natural Products (ESTROVEN + ENERGY MAX STRENGTH PO) Take by mouth daily.   omeprazole (PRILOSEC) 20 MG capsule TAKE 1 CAPSULE(20 MG) BY MOUTH DAILY    PHQ 2/9 Scores 01/11/2022 12/22/2021 09/17/2021 06/25/2021  PHQ - 2 Score 0 1 2 0  PHQ- 9 Score $Remov'6 4 3 6    'uPLnVy$ GAD 7 : Generalized Anxiety Score 01/11/2022 12/22/2021 09/17/2021 06/25/2021  Nervous, Anxious, on Edge $Remov'1 2 1 1  'bGFQXm$ Control/stop worrying 0 $RemoveBe'1 1 1  'MOAuDFYyL$ Worry too much - different things 0 $Remove'1 1 1  'asXYwkJ$ Trouble relaxing 0 0 1 0  Restless 1 0 0 0  Easily annoyed or irritable 0 0 0 0  Afraid - awful might happen 0 0 1 1  Total GAD 7 Score $Remov'2 4 5 4  'hLsFpu$ Anxiety Difficulty - Not difficult at all - Somewhat difficult    BP Readings from Last 3 Encounters:  01/11/22 136/82  12/22/21 112/78  10/22/21 136/84    Physical Exam Vitals and nursing note reviewed.  Constitutional:      General: She is not in acute distress.    Appearance: She is well-developed.  HENT:     Head: Normocephalic and atraumatic.  Cardiovascular:     Rate and Rhythm: Normal rate and regular rhythm.     Pulses: Normal pulses.     Heart sounds: No murmur heard. Pulmonary:     Effort: Pulmonary effort is normal. No respiratory distress.     Breath sounds: No wheezing or rhonchi.  Musculoskeletal:     Cervical back: Normal range of motion.     Right lower leg: No edema.     Left lower leg: No edema.  Lymphadenopathy:     Cervical: No cervical adenopathy.  Skin:    General: Skin is warm and dry.     Findings: No rash.  Neurological:     Mental Status: She is alert and oriented to person, place, and time.  Psychiatric:        Mood and Affect: Mood normal.        Behavior: Behavior normal.    Wt Readings from Last 3 Encounters:   01/11/22 198 lb (89.8 kg)  12/22/21 193 lb 9.6 oz (87.8 kg)  10/22/21 199 lb 3.2 oz (90.4 kg)    BP 136/82    Pulse 77    Ht $R'5\' 3"'qj$  (1.6 m)    Wt 198 lb (89.8 kg)    LMP  (LMP Unknown)    SpO2 98%  BMI 35.07 kg/m   Assessment and Plan: 1. Class 2 obesity with body mass index (BMI) of 35 to 39.9 without comorbidity Resume topiramate plus phentermine. Continue diet changes and exercise Follow up in 2 months for wt check and labs - phentermine (ADIPEX-P) 37.5 MG tablet; Take 1 tablet (37.5 mg total) by mouth daily before breakfast.  Dispense: 30 tablet; Refill: 1  2. Essential hypertension Clinically stable exam with well controlled BP. Tolerating medications without side effects at this time. Pt to continue current regimen and low sodium diet; benefits of regular exercise as able discussed.   Partially dictated using Editor, commissioning. Any errors are unintentional.  Halina Maidens, MD [facility]  01/11/2022

## 2022-01-12 ENCOUNTER — Other Ambulatory Visit: Payer: Self-pay | Admitting: Internal Medicine

## 2022-01-12 DIAGNOSIS — E282 Polycystic ovarian syndrome: Secondary | ICD-10-CM

## 2022-01-12 DIAGNOSIS — E034 Atrophy of thyroid (acquired): Secondary | ICD-10-CM

## 2022-01-12 NOTE — Telephone Encounter (Signed)
Requested medication (s) are due for refill today:   Yes for all 3  Requested medication (s) are on the active medication list:   Yes for all 3  Future visit scheduled:   Yes   Last ordered: All ordered 10/14/2021 with 3 month supply and no refills  Returned because labs due so protocol failed.   Requested Prescriptions  Pending Prescriptions Disp Refills   lisinopril (ZESTRIL) 10 MG tablet [Pharmacy Med Name: LISINOPRIL $RemoveBeforeDE'10MG'cCaGdrZFEcTrbuN$  TABLETS] 90 tablet 0    Sig: TAKE 1 TABLET(10 MG) BY MOUTH DAILY     Cardiovascular:  ACE Inhibitors Failed - 01/12/2022  7:17 AM      Failed - Cr in normal range and within 180 days    Creatinine, Ser  Date Value Ref Range Status  05/17/2021 0.65 0.57 - 1.00 mg/dL Final          Failed - K in normal range and within 180 days    Potassium  Date Value Ref Range Status  05/17/2021 4.9 3.5 - 5.2 mmol/L Final          Passed - Patient is not pregnant      Passed - Last BP in normal range    BP Readings from Last 1 Encounters:  01/11/22 136/82          Passed - Valid encounter within last 6 months    Recent Outpatient Visits           Yesterday Class 2 obesity with body mass index (BMI) of 35 to 39.9 without comorbidity   [facility] Glean Hess, MD   3 weeks ago Vertigo   [facility] Glean Hess, MD   2 months ago Upper respiratory tract infection, unspecified type   [facility] Glean Hess, MD   3 months ago Essential hypertension   [facility] Glean Hess, MD   6 months ago Generalized anxiety disorder   [facility] Glean Hess, MD       Future Appointments             In 1 month Army Melia, Jesse Sans, MD [facility]             metFORMIN (GLUCOPHAGE) 500 MG tablet [Pharmacy Med Name: METFORMIN $RemoveBeforeD'500MG'WXJmIfhDvMxPYy$  TABLETS] 90 tablet 0    Sig: TAKE 1 TABLET(500 MG) BY MOUTH DAILY WITH BREAKFAST     Endocrinology:  Diabetes - Biguanides Failed - 01/12/2022   7:17 AM      Failed - HBA1C is between 0 and 7.9 and within 180 days    Hgb A1c MFr Bld  Date Value Ref Range Status  05/17/2021 5.1 4.8 - 5.6 % Final    Comment:             Prediabetes: 5.7 - 6.4          Diabetes: >6.4          Glycemic control for adults with diabetes: <7.0           Failed - B12 Level in normal range and within 720 days    No results found for: VITAMINB12        Failed - CBC within normal limits and completed in the last 12 months    WBC  Date Value Ref Range Status  12/26/2019 7.0 4.0 - 10.5 K/uL Final   RBC  Date Value Ref Range Status  12/26/2019 5.37 (H) 3.87 - 5.11 MIL/uL Final  Hemoglobin  Date Value Ref Range Status  12/26/2019 14.6 12.0 - 15.0 g/dL Final  02/01/2019 14.1 11.1 - 15.9 g/dL Final   HCT  Date Value Ref Range Status  12/26/2019 44.2 36.0 - 46.0 % Final   Hematocrit  Date Value Ref Range Status  02/01/2019 42.4 34.0 - 46.6 % Final   MCHC  Date Value Ref Range Status  12/26/2019 33.0 30.0 - 36.0 g/dL Final   [facility]  Date Value Ref Range Status  12/26/2019 27.2 26.0 - 34.0 pg Final   MCV  Date Value Ref Range Status  12/26/2019 82.3 80.0 - 100.0 fL Final  02/01/2019 85 79 - 97 fL Final   No results found for: PLTCOUNTKUC, LABPLAT, POCPLA RDW  Date Value Ref Range Status  12/26/2019 13.4 11.5 - 15.5 % Final  02/01/2019 13.4 11.7 - 15.4 % Final         Passed - Cr in normal range and within 360 days    Creatinine, Ser  Date Value Ref Range Status  05/17/2021 0.65 0.57 - 1.00 mg/dL Final          Passed - eGFR in normal range and within 360 days    GFR calc Af Amer  Date Value Ref Range Status  02/07/2020 87 >59 mL/min/1.73 Final   GFR calc non Af Amer  Date Value Ref Range Status  02/07/2020 75 >59 mL/min/1.73 Final   eGFR  Date Value Ref Range Status  05/17/2021 107 >59 mL/min/1.73 Final          Passed - Valid encounter within last 6 months    Recent Outpatient Visits           Yesterday Class  2 obesity with body mass index (BMI) of 35 to 39.9 without comorbidity   [facility] Glean Hess, MD   3 weeks ago Vertigo   [facility] Glean Hess, MD   2 months ago Upper respiratory tract infection, unspecified type   [facility] Glean Hess, MD   3 months ago Essential hypertension   [facility] Glean Hess, MD   6 months ago Generalized anxiety disorder   [facility] Glean Hess, MD       Future Appointments             In 1 month Glean Hess, MD [facility]             levothyroxine (SYNTHROID) 125 MCG tablet [Pharmacy Med Name: LEVOTHYROXINE 0.125MG  (125MCG) TAB] 90 tablet 0    Sig: TAKE 1 TABLET(125 MCG) BY MOUTH DAILY BEFORE BREAKFAST     Endocrinology:  Hypothyroid Agents Failed - 01/12/2022  7:17 AM      Failed - TSH in normal range and within 360 days    TSH  Date Value Ref Range Status  05/17/2021 0.292 (L) 0.450 - 4.500 uIU/mL Final          Passed - Valid encounter within last 12 months    Recent Outpatient Visits           Yesterday Class 2 obesity with body mass index (BMI) of 35 to 39.9 without comorbidity   [facility] Glean Hess, MD   3 weeks ago Vertigo   [facility] Glean Hess, MD   2 months ago Upper respiratory tract infection, unspecified type   [facility] Glean Hess, MD   3 months ago Essential  hypertension   [facility] Glean Hess, MD   6 months ago Generalized anxiety disorder   [facility] Glean Hess, MD       Future Appointments             In 1 month Army Melia Jesse Sans, MD [facility]

## 2022-01-13 ENCOUNTER — Other Ambulatory Visit: Payer: Self-pay | Admitting: Internal Medicine

## 2022-01-13 DIAGNOSIS — Z1231 Encounter for screening mammogram for malignant neoplasm of breast: Secondary | ICD-10-CM

## 2022-02-06 ENCOUNTER — Other Ambulatory Visit: Payer: Self-pay | Admitting: Internal Medicine

## 2022-02-06 DIAGNOSIS — K219 Gastro-esophageal reflux disease without esophagitis: Secondary | ICD-10-CM

## 2022-02-07 ENCOUNTER — Other Ambulatory Visit: Payer: Self-pay

## 2022-02-07 ENCOUNTER — Encounter: Payer: Self-pay | Admitting: Internal Medicine

## 2022-02-11 ENCOUNTER — Other Ambulatory Visit: Payer: Self-pay | Admitting: Gastroenterology

## 2022-02-11 DIAGNOSIS — K5904 Chronic idiopathic constipation: Secondary | ICD-10-CM

## 2022-03-11 ENCOUNTER — Encounter: Payer: Self-pay | Admitting: Internal Medicine

## 2022-03-11 ENCOUNTER — Ambulatory Visit
Admission: RE | Admit: 2022-03-11 | Discharge: 2022-03-11 | Disposition: A | Discharge status: Home / Self Care | Payer: BC Managed Care – PPO | Source: Ambulatory Visit | Attending: Internal Medicine | Admitting: Internal Medicine

## 2022-03-11 ENCOUNTER — Ambulatory Visit (INDEPENDENT_AMBULATORY_CARE_PROVIDER_SITE_OTHER): Payer: BC Managed Care – PPO | Admitting: Internal Medicine

## 2022-03-11 VITALS — BP 114/78 | HR 88 | Ht 63.0 in | Wt 193.0 lb

## 2022-03-11 DIAGNOSIS — E669 Obesity, unspecified: Secondary | ICD-10-CM | POA: Diagnosis not present

## 2022-03-11 DIAGNOSIS — E034 Atrophy of thyroid (acquired): Secondary | ICD-10-CM | POA: Diagnosis not present

## 2022-03-11 DIAGNOSIS — E282 Polycystic ovarian syndrome: Secondary | ICD-10-CM

## 2022-03-11 DIAGNOSIS — E785 Hyperlipidemia, unspecified: Secondary | ICD-10-CM

## 2022-03-11 DIAGNOSIS — I10 Essential (primary) hypertension: Secondary | ICD-10-CM

## 2022-03-11 DIAGNOSIS — Z1231 Encounter for screening mammogram for malignant neoplasm of breast: Secondary | ICD-10-CM | POA: Diagnosis not present

## 2022-03-11 MED ORDER — PHENTERMINE HCL 37.5 MG PO TABS: 37.5000 mg | ORAL_TABLET | Freq: Every day | ORAL | 2 refills | Status: DC

## 2022-03-11 NOTE — Progress Notes (Signed)
? ? ?Date:  03/11/2022  ? ?Name:  Cindy Pena   DOB:  [birth year]   MRN:  [id number] ? ? ?Chief Complaint: Hypertension, Weight Check, and Hyperlipidemia ? ?Hypertension ?This is a chronic problem. The problem is controlled. Pertinent negatives include no chest pain, headaches, palpitations or shortness of breath. Past treatments include ACE inhibitors. The current treatment provides significant improvement. Identifiable causes of hypertension include a thyroid problem.  ?Hyperlipidemia ?This is a chronic problem. The problem is uncontrolled. Pertinent negatives include no chest pain or shortness of breath. Current antihyperlipidemic treatment includes diet change.  ?Thyroid Problem ?Presents for follow-up visit. Patient reports no constipation, diarrhea, fatigue, palpitations or tremors. The symptoms have been stable. Her past medical history is significant for hyperlipidemia.  ?Weight follow up - on topiramate and phentermine.  Last weight 198 lbs. 2 months ago. Today's weight is 193 lbs.  By her home scales she has lost 10 lbs.  She stopped topiramate due to nausea and headaches.  She continues with diet changes and regular exercise. ? ?PCOS -  on metformin daily.  Last A1C 5.1. ? ?Lab Results  ?Component Value Date  ? NA 142 05/17/2021  ? K 4.9 05/17/2021  ? CO2 23 05/17/2021  ? GLUCOSE 92 05/17/2021  ? BUN 25 (H) 05/17/2021  ? CREATININE 0.65 05/17/2021  ? CALCIUM 9.9 05/17/2021  ? EGFR 107 05/17/2021  ? GFRNONAA 75 02/07/2020  ? ?Lab Results  ?Component Value Date  ? CHOL 219 (H) 02/07/2020  ? HDL 52 02/07/2020  ? LDLCALC 141 (H) 02/07/2020  ? TRIG 144 02/07/2020  ? CHOLHDL 4.2 02/07/2020  ? ?Lab Results  ?Component Value Date  ? TSH 0.292 (L) 05/17/2021  ? ?Lab Results  ?Component Value Date  ? HGBA1C 5.1 05/17/2021  ? ?Lab Results  ?Component Value Date  ? WBC 7.0 12/26/2019  ? HGB 14.6 12/26/2019  ? HCT 44.2 12/26/2019  ? MCV 82.3 12/26/2019  ? PLT 155 12/26/2019  ? ?Lab Results  ?Component Value  Date  ? ALT 14 05/17/2021  ? AST 18 05/17/2021  ? ALKPHOS 124 (H) 05/17/2021  ? BILITOT 0.3 05/17/2021  ? ?Lab Results  ?Component Value Date  ? VD25OH 23.1 (L) 06/30/2021  ?  ? ?Review of Systems  ?Constitutional:  Negative for appetite change, fatigue, fever and unexpected weight change.  ?HENT:  Negative for nosebleeds, tinnitus and trouble swallowing.   ?Eyes:  Negative for visual disturbance.  ?Respiratory:  Negative for cough, chest tightness, shortness of breath and wheezing.   ?Cardiovascular:  Negative for chest pain, palpitations and leg swelling.  ?Gastrointestinal:  Negative for abdominal pain, constipation and diarrhea.  ?Endocrine: Negative for polydipsia and polyuria.  ?Genitourinary:  Negative for dysuria and hematuria.  ?Musculoskeletal:  Negative for arthralgias.  ?Neurological:  Negative for dizziness, tremors, weakness, light-headedness, numbness and headaches.  ?Psychiatric/Behavioral:  Negative for dysphoric mood.   ? ?Patient Active Problem List  ? Diagnosis Date Noted  ? Generalized anxiety disorder 07/09/2021  ? Hepatic steatosis 02/20/2019  ? Mild hyperlipidemia 02/02/2019  ? Hypothyroidism due to acquired atrophy of thyroid 02/01/2019  ? Essential hypertension 05/21/2018  ? Eczema 10/20/2017  ? Class 2 obesity with body mass index (BMI) of 35 to 39.9 without comorbidity 04/19/2016  ? Gastroesophageal reflux disease without esophagitis 07/08/2015  ? PCOS (polycystic ovarian syndrome) 07/08/2015  ? ? ?Allergies  ?Allergen Reactions  ? Darvon [Propoxyphene] Other (See Comments)  ?  Chest Pain.  ? Penicillins Other (See Comments)  ?  Migraines.  ? Tramadol Hcl Rash  ? Other Other (See Comments)  ? ? ?Past Surgical History:  ?Procedure Laterality Date  ? c section  1996  ? September- SON Birth  ? CHOLECYSTECTOMY  2003  ? COLONOSCOPY WITH PROPOFOL N/A 02/11/2021  ? Procedure: COLONOSCOPY WITH PROPOFOL;  Surgeon: Lin Landsman, MD;  Location: [facility] ENDOSCOPY;  Service: Gastroenterology;   Laterality: N/A;  ? [place] & 2006  ? ooph Right 1995  ? Right tube removed- mass was on tube.  ? TOTAL ABDOMINAL HYSTERECTOMY  2009  ? Right ovary attached to center of stomach.  ? ? ?Social History  ? ?Tobacco Use  ? Smoking status: Never  ? Smokeless tobacco: Never  ?Vaping Use  ? Vaping Use: Never used  ?Substance Use Topics  ? Alcohol use: No  ? Drug use: No  ? ? ? ?Medication list has been reviewed and updated. ? ?Current Meds  ?Medication Sig  ? docusate sodium (COLACE) 100 MG capsule Take 100 mg by mouth 2 (two) times daily.  ? levothyroxine (SYNTHROID) 125 MCG tablet TAKE 1 TABLET(125 MCG) BY MOUTH DAILY BEFORE BREAKFAST  ? linaclotide (LINZESS) 145 MCG CAPS capsule TAKE 1 CAPSULE(145 MCG) BY MOUTH DAILY BEFORE BREAKFAST  ? lisinopril (ZESTRIL) 10 MG tablet TAKE 1 TABLET(10 MG) BY MOUTH DAILY  ? metFORMIN (GLUCOPHAGE) 500 MG tablet TAKE 1 TABLET(500 MG) BY MOUTH DAILY WITH BREAKFAST  ? Misc Natural Products (ESTROVEN + ENERGY MAX STRENGTH PO) Take by mouth daily.  ? omeprazole (PRILOSEC) 20 MG capsule TAKE 1 CAPSULE(20 MG) BY MOUTH DAILY  ? phentermine (ADIPEX-P) 37.5 MG tablet Take 1 tablet (37.5 mg total) by mouth daily before breakfast.  ? ? ? ?  01/11/2022  ?  3:36 PM 12/22/2021  ? 11:21 AM 09/17/2021  ?  2:23 PM 06/25/2021  ?  3:25 PM  ?GAD 7 : Generalized Anxiety Score  ?Nervous, Anxious, on Edge $Remov'1 2 1 1  'UUgOjc$ ?Control/stop worrying 0 $RemoveBe'1 1 1  'pNKEtluKG$ ?Worry too much - different things 0 $Remove'1 1 1  'lIBzZeC$ ?Trouble relaxing 0 0 1 0  ?Restless 1 0 0 0  ?Easily annoyed or irritable 0 0 0 0  ?Afraid - awful might happen 0 0 1 1  ?Total GAD 7 Score $Remov'2 4 5 4  'PKLBPe$ ?Anxiety Difficulty  Not difficult at all  Somewhat difficult  ? ? ? ?  01/11/2022  ?  3:36 PM  ?Depression screen PHQ 2/9  ?Decreased Interest 0  ?Down, Depressed, Hopeless 0  ?PHQ - 2 Score 0  ?Altered sleeping 1  ?Tired, decreased energy 2  ?Change in appetite 2  ?Feeling bad or failure about yourself  1  ?Trouble concentrating 0  ?Moving slowly or  fidgety/restless 0  ?Suicidal thoughts 0  ?PHQ-9 Score 6  ?Difficult doing work/chores Not difficult at all  ? ? ?BP Readings from Last 3 Encounters:  ?03/11/22 114/78  ?01/11/22 136/82  ?12/22/21 112/78  ? ? ?Physical Exam ?Vitals and nursing note reviewed.  ?Constitutional:   ?   General: She is not in acute distress. ?   Appearance: She is well-developed.  ?HENT:  ?   Head: Normocephalic and atraumatic.  ?Neck:  ?   Vascular: No carotid bruit.  ?Cardiovascular:  ?   Rate and Rhythm: Normal rate and regular rhythm.  ?   Pulses: Normal pulses.  ?   Heart sounds: No murmur heard. ?Pulmonary:  ?   Effort: Pulmonary effort is  normal. No respiratory distress.  ?   Breath sounds: No wheezing or rhonchi.  ?Musculoskeletal:  ?   Cervical back: Normal range of motion.  ?   Right lower leg: No edema.  ?   Left lower leg: No edema.  ?Lymphadenopathy:  ?   Cervical: No cervical adenopathy.  ?Skin: ?   General: Skin is warm and dry.  ?   Findings: No rash.  ?Neurological:  ?   General: No focal deficit present.  ?   Mental Status: She is alert and oriented to person, place, and time.  ?Psychiatric:     ?   Mood and Affect: Mood normal.     ?   Behavior: Behavior normal.  ? ? ?Wt Readings from Last 3 Encounters:  ?03/11/22 193 lb (87.5 kg)  ?01/11/22 198 lb (89.8 kg)  ?12/22/21 193 lb 9.6 oz (87.8 kg)  ? ? ?BP 114/78   Pulse 88   Ht $R'5\' 3"'Vv$  (1.6 m)   Wt 193 lb (87.5 kg)   LMP  (LMP Unknown)   SpO2 98%   BMI 34.19 kg/m?  ? ?Assessment and Plan: ?1. Essential hypertension ?Clinically stable exam with well controlled BP. ?Tolerating medications without side effects at this time. ?Pt to continue current regimen and low sodium diet; benefits of regular exercise as able discussed. ?- Comprehensive metabolic panel ? ?2. Mild hyperlipidemia ?Check labs ?- Lipid panel ? ?3. Class 2 obesity with body mass index (BMI) of 35 to 39.9 without comorbidity ?Continue diet, exercise and Phentermine daily ?Follow up in 3 months ?- phentermine  (ADIPEX-P) 37.5 MG tablet; Take 1 tablet (37.5 mg total) by mouth daily before breakfast.  Dispense: 30 tablet; Refill: 2 ? ?4. Hypothyroidism due to acquired atrophy of thyroid ?supplemented ?- TSH + free T4 ? ?

## 2022-03-12 LAB — COMPREHENSIVE METABOLIC PANEL
ALT: 20 IU/L (ref 0–32)
AST: 21 IU/L (ref 0–40)
Albumin/Globulin Ratio: 2.4 — ABNORMAL HIGH (ref 1.2–2.2)
Albumin: 4.8 g/dL (ref 3.8–4.9)
Alkaline Phosphatase: 139 IU/L — ABNORMAL HIGH (ref 44–121)
BUN/Creatinine Ratio: 31 — ABNORMAL HIGH (ref 9–23)
BUN: 26 mg/dL — ABNORMAL HIGH (ref 6–24)
Bilirubin Total: 0.6 mg/dL (ref 0.0–1.2)
CO2: 25 mmol/L (ref 20–29)
Calcium: 9.7 mg/dL (ref 8.7–10.2)
Chloride: 103 mmol/L (ref 96–106)
Creatinine, Ser: 0.84 mg/dL (ref 0.57–1.00)
Globulin, Total: 2 g/dL (ref 1.5–4.5)
Glucose: 96 mg/dL (ref 70–99)
Potassium: 4.6 mmol/L (ref 3.5–5.2)
Sodium: 141 mmol/L (ref 134–144)
Total Protein: 6.8 g/dL (ref 6.0–8.5)
eGFR: 84 mL/min/{1.73_m2} (ref >59)

## 2022-03-12 LAB — HEMOGLOBIN A1C
Est. average glucose Bld gHb Est-mCnc: 100 mg/dL
Hgb A1c MFr Bld: 5.1 % (ref 4.8–5.6)

## 2022-03-12 LAB — LIPID PANEL
Chol/HDL Ratio: 3.3 ratio (ref 0.0–4.4)
Cholesterol, Total: 193 mg/dL (ref 100–199)
HDL: 58 mg/dL (ref >39)
LDL Chol Calc (NIH): 114 mg/dL — ABNORMAL HIGH (ref 0–99)
Triglycerides: 118 mg/dL (ref 0–149)
VLDL Cholesterol Cal: 21 mg/dL (ref 5–40)

## 2022-03-12 LAB — TSH+FREE T4
Free T4: 2.11 ng/dL — ABNORMAL HIGH (ref 0.82–1.77)
TSH: 0.09 u[IU]/mL — ABNORMAL LOW (ref 0.450–4.500)

## 2022-03-13 ENCOUNTER — Other Ambulatory Visit: Payer: Self-pay | Admitting: Internal Medicine

## 2022-03-13 DIAGNOSIS — E034 Atrophy of thyroid (acquired): Secondary | ICD-10-CM

## 2022-03-13 MED ORDER — LEVOTHYROXINE SODIUM 112 MCG PO TABS: 112.0000 ug | ORAL_TABLET | Freq: Every day | ORAL | 1 refills | Status: DC

## 2022-03-19 ENCOUNTER — Encounter: Payer: Self-pay | Admitting: Internal Medicine

## 2022-04-04 ENCOUNTER — Telehealth: Payer: BC Managed Care – PPO | Admitting: Physician Assistant

## 2022-04-04 DIAGNOSIS — M545 Low back pain, unspecified: Secondary | ICD-10-CM

## 2022-04-04 MED ORDER — NAPROXEN 500 MG PO TABS: 500.0000 mg | ORAL_TABLET | Freq: Two times a day (BID) | ORAL | 0 refills | Status: DC

## 2022-04-04 MED ORDER — CYCLOBENZAPRINE HCL 10 MG PO TABS: 10.0000 mg | ORAL_TABLET | Freq: Three times a day (TID) | ORAL | 0 refills | Status: DC | PRN

## 2022-04-04 NOTE — Progress Notes (Signed)
We are sorry that you are not feeling well.  Here is how we plan to help! ? ?Based on what you have shared with me it looks like you mostly have acute back pain. ? ?Acute back pain is defined as musculoskeletal pain that can resolve in 1-3 weeks with conservative treatment. ? ?I have prescribed Naprosyn 500 mg take one by mouth twice a day non-steroid anti-inflammatory (NSAID) as well as Flexeril 10 mg every eight hours as needed which is a muscle relaxer  Some patients experience stomach irritation or in increased heartburn with anti-inflammatory drugs.  Please keep in mind that muscle relaxer's can cause fatigue and should not be taken while at work or driving.  Back pain is very common.  The pain often gets better over time.  The cause of back pain is usually not dangerous.  Most people can learn to manage their back pain on their own. ? ?In your case, if level of pain and symptoms are not improving with what you have been given over the next 24 hours, or any new or worsening symptoms, you need to be evaluated in-person ASAP whether with PCP, Urgent Care or ER ? ?Home Care ?Stay active.  Start with short walks on flat ground if you can.  Try to walk farther each day. ?Do not sit, drive or stand in one place for more than 30 minutes.  Do not stay in bed. ?Do not avoid exercise or work.  Activity can help your back heal faster. ?Be careful when you bend or lift an object.  Bend at your knees, keep the object close to you, and do not twist. ?Sleep on a firm mattress.  Lie on your side, and bend your knees.  If you lie on your back, put a pillow under your knees. ?Only take medicines as told by your doctor. ?Put ice on the injured area. ?Put ice in a plastic bag ?Place a towel between your skin and the bag ?Leave the ice on for 15-20 minutes, 3-4 times a day for the first 2-3 days. 210 After that, you can switch between ice and heat packs. ?Ask your doctor about back exercises or massage. ?Avoid feeling anxious or  stressed.  Find good ways to deal with stress, such as exercise. ? ?Get Help Right Way If: ?Your pain does not go away with rest or medicine. ?Your pain does not go away in 1 week. ?You have new problems. ?You do not feel well. ?The pain spreads into your legs. ?You cannot control when you poop (bowel movement) or pee (urinate) ?You feel sick to your stomach (nauseous) or throw up (vomit) ?You have belly (abdominal) pain. ?You feel like you may pass out (faint). ?If you develop a fever. ? ?Make Sure you: ?Understand these instructions. ?Will watch your condition ?Will get help right away if you are not doing well or get worse. ? ?Your e-visit answers were reviewed by a board certified advanced clinical practitioner to complete your personal care plan.  Depending on the condition, your plan could have included both over the counter or prescription medications. ? ?If there is a problem please reply  once you have received a response from your provider. ? ?Your safety is important to [place].  If you have drug allergies check your prescription carefully.   ? ?You can use MyChart to ask questions about today?s visit, request a non-urgent call back, or ask for a work or school excuse for 24 hours related to this e-Visit. If  it has been greater than 24 hours you will need to follow up with your provider, or enter a new e-Visit to address those concerns. ? ?You will get an e-mail in the next two days asking about your experience.  I hope that your e-visit has been valuable and will speed your recovery. Thank you for using e-visits. ? ?

## 2022-04-04 NOTE — Progress Notes (Signed)
I have spent 5 minutes in review of e-visit questionnaire, review and updating patient chart, medical decision making and response to patient.   Jaina Morin Cody Eastyn Skalla, PA-C    

## 2022-04-12 ENCOUNTER — Other Ambulatory Visit: Payer: Self-pay | Admitting: Internal Medicine

## 2022-04-12 DIAGNOSIS — E034 Atrophy of thyroid (acquired): Secondary | ICD-10-CM

## 2022-04-12 DIAGNOSIS — E282 Polycystic ovarian syndrome: Secondary | ICD-10-CM

## 2022-04-12 NOTE — Telephone Encounter (Signed)
Dose changed 03/13/22. ?Requested Prescriptions  ?Signed Prescriptions Disp Refills  ? lisinopril (ZESTRIL) 10 MG tablet 90 tablet 0  ?  Sig: TAKE 1 TABLET(10 MG) BY MOUTH DAILY  ?  ? Cardiovascular:  ACE Inhibitors Passed - 04/12/2022  7:02 AM  ?  ?  Passed - Cr in normal range and within 180 days  ?  Creatinine, Ser  ?Date Value Ref Range Status  ?03/11/2022 0.84 0.57 - 1.00 mg/dL Final  ?   ?  ?  Passed - K in normal range and within 180 days  ?  Potassium  ?Date Value Ref Range Status  ?03/11/2022 4.6 3.5 - 5.2 mmol/L Final  ?   ?  ?  Passed - Patient is not pregnant  ?  ?  Passed - Last BP in normal range  ?  BP Readings from Last 1 Encounters:  ?03/11/22 114/78  ?   ?  ?  Passed - Valid encounter within last 6 months  ?  Recent Outpatient Visits   ?      ? 1 month ago Essential hypertension  ? [facility] Glean Hess, MD  ? 3 months ago Class 2 obesity with body mass index (BMI) of 35 to 39.9 without comorbidity  ? [facility] Glean Hess, MD  ? 3 months ago Vertigo  ? [facility] Glean Hess, MD  ? 5 months ago Upper respiratory tract infection, unspecified type  ? [facility] Glean Hess, MD  ? 6 months ago Essential hypertension  ? [facility] Glean Hess, MD  ?  ?  ?Future Appointments   ?        ? In 1 month Glean Hess, MD [facility]  ?  ? ?  ?  ?  ? metFORMIN (GLUCOPHAGE) 500 MG tablet 90 tablet 0  ?  Sig: TAKE 1 TABLET(500 MG) BY MOUTH DAILY WITH BREAKFAST  ?  ? Endocrinology:  Diabetes - Biguanides Failed - 04/12/2022  7:02 AM  ?  ?  Failed - B12 Level in normal range and within 720 days  ?  No results found for: VITAMINB12   ?  ?  Failed - CBC within normal limits and completed in the last 12 months  ?  WBC  ?Date Value Ref Range Status  ?12/26/2019 7.0 4.0 - 10.5 K/uL Final  ? ?RBC  ?Date Value Ref Range Status  ?12/26/2019 5.37 (H) 3.87 - 5.11 MIL/uL Final  ? ?Hemoglobin  ?Date Value Ref Range Status   ?12/26/2019 14.6 12.0 - 15.0 g/dL Final  ?02/01/2019 14.1 11.1 - 15.9 g/dL Final  ? ?HCT  ?Date Value Ref Range Status  ?12/26/2019 44.2 36.0 - 46.0 % Final  ? ?Hematocrit  ?Date Value Ref Range Status  ?02/01/2019 42.4 34.0 - 46.6 % Final  ? ?MCHC  ?Date Value Ref Range Status  ?12/26/2019 33.0 30.0 - 36.0 g/dL Final  ? ?MCH  ?Date Value Ref Range Status  ?12/26/2019 27.2 26.0 - 34.0 pg Final  ? ?MCV  ?Date Value Ref Range Status  ?12/26/2019 82.3 80.0 - 100.0 fL Final  ?02/01/2019 85 79 - 97 fL Final  ? ?No results found for: PLTCOUNTKUC, LABPLAT, [place] ?RDW  ?Date Value Ref Range Status  ?12/26/2019 13.4 11.5 - 15.5 % Final  ?02/01/2019 13.4 11.7 - 15.4 % Final  ? ?  ?  ?  Passed - Cr in normal range and within 360  days  ?  Creatinine, Ser  ?Date Value Ref Range Status  ?03/11/2022 0.84 0.57 - 1.00 mg/dL Final  ?   ?  ?  Passed - HBA1C is between 0 and 7.9 and within 180 days  ?  Hgb A1c MFr Bld  ?Date Value Ref Range Status  ?03/11/2022 5.1 4.8 - 5.6 % Final  ?  Comment:  ?           Prediabetes: 5.7 - 6.4 ?         Diabetes: >6.4 ?         Glycemic control for adults with diabetes: <7.0 ?  ?   ?  ?  Passed - eGFR in normal range and within 360 days  ?  GFR calc Af Amer  ?Date Value Ref Range Status  ?02/07/2020 87 >59 mL/min/1.73 Final  ? ?GFR calc non Af Amer  ?Date Value Ref Range Status  ?02/07/2020 75 >59 mL/min/1.73 Final  ? ?eGFR  ?Date Value Ref Range Status  ?03/11/2022 84 >59 mL/min/1.73 Final  ?   ?  ?  Passed - Valid encounter within last 6 months  ?  Recent Outpatient Visits   ?      ? 1 month ago Essential hypertension  ? [facility] Glean Hess, MD  ? 3 months ago Class 2 obesity with body mass index (BMI) of 35 to 39.9 without comorbidity  ? [facility] Glean Hess, MD  ? 3 months ago Vertigo  ? [facility] Glean Hess, MD  ? 5 months ago Upper respiratory tract infection, unspecified type  ? [facility] Glean Hess, MD  ? 6 months  ago Essential hypertension  ? [facility] Glean Hess, MD  ?  ?  ?Future Appointments   ?        ? In 1 month Army Melia Jesse Sans, MD [facility] [place]  ?  ? ?  ?  ?  ?Refused Prescriptions Disp Refills  ?? levothyroxine (SYNTHROID) 125 MCG tablet [Pharmacy Med Name: LEVOTHYROXINE 0.125MG  (125MCG) TAB] 90 tablet 0  ?  Sig: TAKE 1 TABLET(125 MCG) BY MOUTH DAILY BEFORE AND BREAKFAST  ?  ? Endocrinology:  Hypothyroid Agents Failed - 04/12/2022  7:02 AM  ?  ?  Failed - TSH in normal range and within 360 days  ?  TSH  ?Date Value Ref Range Status  ?03/11/2022 0.090 (L) 0.450 - 4.500 uIU/mL Final  ?   ?  ?  Passed - Valid encounter within last 12 months  ?  Recent Outpatient Visits   ?      ? 1 month ago Essential hypertension  ? [facility] Glean Hess, MD  ? 3 months ago Class 2 obesity with body mass index (BMI) of 35 to 39.9 without comorbidity  ? [facility] Glean Hess, MD  ? 3 months ago Vertigo  ? [facility] Glean Hess, MD  ? 5 months ago Upper respiratory tract infection, unspecified type  ? [facility] Glean Hess, MD  ? 6 months ago Essential hypertension  ? [facility] Glean Hess, MD  ?  ?  ?Future Appointments   ?        ? In 1 month Army Melia Jesse Sans, MD [facility] [place]  ?  ? ?  ?  ?  ? ?

## 2022-04-12 NOTE — Telephone Encounter (Signed)
Dose changed 03/13/22. ?Requested Prescriptions  ?Refused Prescriptions Disp Refills  ?? levothyroxine (SYNTHROID) 125 MCG tablet [Pharmacy Med Name: LEVOTHYROXINE 0.125MG  ( ) TAB] 90 tablet 0  ?  Sig: TAKE 1 TABLET(125 MCG) BY MOUTH DAILY BEFORE BREAKFAST  ?  ? Endocrinology:  Hypothyroid Agents Failed - 04/12/2022  9:56 AM  ?  ?  Failed - TSH in normal range and within 360 days  ?  TSH  ?Date Value Ref Range Status  ?03/11/2022 0.090 (L) 0.450 - 4.500 uIU/mL Final  ?   ?  ?  Passed - Valid encounter within last 12 months  ?  Recent Outpatient Visits   ?      ? 1 month ago Essential hypertension  ? [facility] [name], MD  ? 3 months ago Class 2 obesity with body mass index (BMI) of 35 to 39.9 without comorbidity  ? [facility] [name], MD  ? 3 months ago Vertigo  ? [facility] [name], MD  ? 5 months ago Upper respiratory tract infection, unspecified type  ? [facility] [name], MD  ? 6 months ago Essential hypertension  ? [facility] [name], MD  ?  ?  ?Future Appointments   ?        ? In 1 month Judithann Graves Nyoka Cowden, MD [facility]  ?  ? ?  ?  ?  ? ?

## 2022-05-08 ENCOUNTER — Other Ambulatory Visit: Payer: Self-pay | Admitting: Internal Medicine

## 2022-05-08 DIAGNOSIS — K219 Gastro-esophageal reflux disease without esophagitis: Secondary | ICD-10-CM

## 2022-05-10 ENCOUNTER — Other Ambulatory Visit: Payer: Self-pay | Admitting: Internal Medicine

## 2022-05-10 DIAGNOSIS — K219 Gastro-esophageal reflux disease without esophagitis: Secondary | ICD-10-CM

## 2022-05-10 MED ORDER — OMEPRAZOLE 20 MG PO CPDR: 20.0000 mg | DELAYED_RELEASE_CAPSULE | Freq: Two times a day (BID) | ORAL | 0 refills | Status: DC

## 2022-05-10 NOTE — Telephone Encounter (Signed)
Signed 05/10/22. Requested by interface surescripts. Receipt confirmed by pharmacy 05/10/22 at 4:12 pm.  Requested Prescriptions  Refused Prescriptions Disp Refills  . omeprazole (PRILOSEC) 20 MG capsule [Pharmacy Med Name: OMEPRAZOLE 20MG  CAPSULES] 90 capsule 0    Sig: TAKE 1 CAPSULE(20 MG) BY MOUTH DAILY     Gastroenterology: Proton Pump Inhibitors Passed - 05/08/2022 10:51 AM      Passed - Valid encounter within last 12 months    Recent Outpatient Visits          2 months ago Essential hypertension   [facility], MD   3 months ago Class 2 obesity with body mass index (BMI) of 35 to 39.9 without comorbidity   [facility], MD   4 months ago Vertigo   [facility], MD   6 months ago Upper respiratory tract infection, unspecified type   [facility], MD   7 months ago Essential hypertension   [facility], MD      Future Appointments            In 1 month [name], Judithann Graves, MD [facility]

## 2022-05-12 ENCOUNTER — Encounter: Payer: Self-pay | Admitting: Gastroenterology

## 2022-05-12 ENCOUNTER — Other Ambulatory Visit: Payer: Self-pay | Admitting: Gastroenterology

## 2022-05-12 DIAGNOSIS — K5904 Chronic idiopathic constipation: Secondary | ICD-10-CM

## 2022-05-13 ENCOUNTER — Other Ambulatory Visit: Payer: Self-pay

## 2022-05-13 ENCOUNTER — Encounter: Payer: Self-pay | Admitting: Internal Medicine

## 2022-05-13 ENCOUNTER — Encounter: Payer: Self-pay | Admitting: Gastroenterology

## 2022-05-13 DIAGNOSIS — K5904 Chronic idiopathic constipation: Secondary | ICD-10-CM

## 2022-05-13 MED ORDER — LINACLOTIDE 145 MCG PO CAPS: 145.0000 ug | ORAL_CAPSULE | Freq: Every day | ORAL | 0 refills | Status: DC

## 2022-05-13 MED ORDER — LINACLOTIDE 145 MCG PO CAPS: ORAL_CAPSULE | ORAL | 0 refills | Status: DC

## 2022-05-13 NOTE — Progress Notes (Signed)
Made patient a follow up appointment for 05/26/2022 and refilled medication for 1 month

## 2022-05-13 NOTE — Telephone Encounter (Signed)
Schedule patient for 05/26/2022 and patient canceled appointment. Reschedule patient to 09/27/2022. Sent in 30 days to get through till June. Please advise if you want to give her 90 days to get her through till appointment

## 2022-05-26 ENCOUNTER — Ambulatory Visit: Payer: BC Managed Care – PPO | Admitting: Gastroenterology

## 2022-06-10 ENCOUNTER — Ambulatory Visit (INDEPENDENT_AMBULATORY_CARE_PROVIDER_SITE_OTHER): Payer: BC Managed Care – PPO | Admitting: Internal Medicine

## 2022-06-10 ENCOUNTER — Encounter: Payer: Self-pay | Admitting: Internal Medicine

## 2022-06-10 VITALS — BP 138/80 | HR 88 | Ht 63.0 in | Wt 197.0 lb

## 2022-06-10 DIAGNOSIS — I10 Essential (primary) hypertension: Secondary | ICD-10-CM

## 2022-06-10 DIAGNOSIS — E034 Atrophy of thyroid (acquired): Secondary | ICD-10-CM

## 2022-06-10 DIAGNOSIS — K760 Fatty (change of) liver, not elsewhere classified: Secondary | ICD-10-CM | POA: Diagnosis not present

## 2022-06-10 NOTE — Progress Notes (Signed)
Date:  06/10/2022   Name:  Cindy Pena   DOB:  August 04, 1971   MRN:  [id number]   Chief Complaint: Hypertension, Weight Check, and Thyroid Problem (Recheck thyroid labs)  Hypertension This is a chronic problem. The problem is controlled. Pertinent negatives include no chest pain, headaches, palpitations or shortness of breath. Past treatments include ACE inhibitors. Identifiable causes of hypertension include a thyroid problem.  Thyroid Problem Presents for follow-up visit. Patient reports no anxiety, constipation, diarrhea, fatigue or palpitations.  Weight - she stopped Phentermine due to lack of weight loss. It gave her energy and helped her mood.  She is now trying to cut back on calories and exercise more.   Lab Results  Component Value Date   NA 141 03/11/2022   K 4.6 03/11/2022   CO2 25 03/11/2022   GLUCOSE 96 03/11/2022   BUN 26 (H) 03/11/2022   CREATININE 0.84 03/11/2022   CALCIUM 9.7 03/11/2022   EGFR 84 03/11/2022   GFRNONAA 75 02/07/2020   Lab Results  Component Value Date   CHOL 193 03/11/2022   HDL 58 03/11/2022   LDLCALC 114 (H) 03/11/2022   TRIG 118 03/11/2022   CHOLHDL 3.3 03/11/2022   Lab Results  Component Value Date   TSH 0.090 (L) 03/11/2022   Lab Results  Component Value Date   HGBA1C 5.1 03/11/2022   Lab Results  Component Value Date   WBC 7.0 12/26/2019   HGB 14.6 12/26/2019   HCT 44.2 12/26/2019   MCV 82.3 12/26/2019   PLT 155 12/26/2019   Lab Results  Component Value Date   ALT 20 03/11/2022   AST 21 03/11/2022   ALKPHOS 139 (H) 03/11/2022   BILITOT 0.6 03/11/2022   Lab Results  Component Value Date   VD25OH 23.1 (L) 06/30/2021     Review of Systems  Constitutional:  Negative for fatigue and unexpected weight change.  HENT:  Negative for nosebleeds.   Eyes:  Negative for visual disturbance.  Respiratory:  Negative for cough, chest tightness, shortness of breath and wheezing.   Cardiovascular:  Negative for chest  pain, palpitations and leg swelling.  Gastrointestinal:  Negative for abdominal pain, constipation and diarrhea.  Musculoskeletal:  Negative for arthralgias.  Neurological:  Negative for dizziness, weakness, light-headedness and headaches.  Psychiatric/Behavioral:  Negative for dysphoric mood and sleep disturbance. The patient is not nervous/anxious.     Patient Active Problem List   Diagnosis Date Noted   Generalized anxiety disorder 07/09/2021   Hepatic steatosis 02/20/2019   Mild hyperlipidemia 02/02/2019   Hypothyroidism due to acquired atrophy of thyroid 02/01/2019   Essential hypertension 05/21/2018   Eczema 10/20/2017   Class 2 obesity with body mass index (BMI) of 35 to 39.9 without comorbidity 04/19/2016   Gastroesophageal reflux disease without esophagitis 07/08/2015   PCOS (polycystic ovarian syndrome) 07/08/2015    Allergies  Allergen Reactions   Darvon [Propoxyphene] Other (See Comments)    Chest Pain.   Penicillins Other (See Comments)    Migraines.   Tramadol Hcl Rash   Other Other (See Comments)    Past Surgical History:  Procedure Laterality Date   c section  1996   September- SON Birth   CHOLECYSTECTOMY  2003   COLONOSCOPY WITH PROPOFOL N/A 02/11/2021   Procedure: COLONOSCOPY WITH PROPOFOL;  Surgeon: Toney Reil, MD;  Location: [facility] END[facility]COPY;  Service: Gastroenterology;  Laterality: N/A;   DILATION AND CURETTAGE OF UTERUS  1996 & 2006   ooph Right 1995  Right tube removed- mass was on tube.   TOTAL ABDOMINAL HYSTERECTOMY  2009   Right ovary attached to center of stomach.    Social History   Tobacco Use   Smoking status: Never   Smokeless tobacco: Never  Vaping Use   Vaping Use: Never used  Substance Use Topics   Alcohol use: No   Drug use: No     Medication list has been reviewed and updated.  Current Meds  Medication Sig   docusate sodium (COLACE) 100 MG capsule Take 100 mg by mouth 2 (two) times daily.   levothyroxine  (SYNTHROID) 112 MCG tablet Take 1 tablet (112 mcg total) by mouth daily before breakfast.   linaclotide (LINZESS) 145 MCG CAPS capsule Take 1 capsule (145 mcg total) by mouth daily before breakfast.   lisinopril (ZESTRIL) 10 MG tablet TAKE 1 TABLET(10 MG) BY MOUTH DAILY   metFORMIN (GLUCOPHAGE) 500 MG tablet TAKE 1 TABLET(500 MG) BY MOUTH DAILY WITH BREAKFAST   Misc Natural Products (ESTROVEN + ENERGY MAX STRENGTH PO) Take by mouth daily.   naproxen (NAPROSYN) 500 MG tablet Take 1 tablet (500 mg total) by mouth 2 (two) times daily with a meal.   omeprazole (PRILOSEC) 20 MG capsule Take 1 capsule (20 mg total) by mouth 2 (two) times daily before a meal.       06/10/2022    3:32 PM 03/11/2022    8:21 AM 01/11/2022    3:36 PM 12/22/2021   11:21 AM  GAD 7 : Generalized Anxiety Score  Nervous, Anxious, on Edge $Remov'2 1 1 2  'VibKOP$ Control/stop worrying 1 1 0 1  Worry too much - different things 1 1 0 1  Trouble relaxing 1 0 0 0  Restless 1 0 1 0  Easily annoyed or irritable 0 0 0 0  Afraid - awful might happen 2 1 0 0  Total GAD 7 Score $Remov'8 4 2 4  'lWHwyc$ Anxiety Difficulty Not difficult at all   Not difficult at all       06/10/2022    3:32 PM 03/11/2022    8:21 AM 01/11/2022    3:36 PM  Depression screen PHQ 2/9  Decreased Interest 0 0 0  Down, Depressed, Hopeless 0 0 0  PHQ - 2 Score 0 0 0  Altered sleeping $RemoveBeforeDE'2 1 1  'UQdIPTjyMpiqDYl$ Tired, decreased energy $RemoveBeforeDE'1 1 2  'pIQIDKfirvdbCQX$ Change in appetite 2 0 2  Feeling bad or failure about yourself  1 0 1  Trouble concentrating 0 0 0  Moving slowly or fidgety/restless 0 0 0  Suicidal thoughts 0 0 0  PHQ-9 Score $RemoveBef'6 2 6  'SjXPUzTuza$ Difficult doing work/chores Not difficult at all Not difficult at all Not difficult at all    BP Readings from Last 3 Encounters:  06/10/22 138/80  03/11/22 114/78  01/11/22 136/82    Physical Exam Vitals and nursing note reviewed.  Constitutional:      General: She is not in acute distress.    Appearance: She is well-developed.  HENT:     Head: Normocephalic and  atraumatic.  Cardiovascular:     Rate and Rhythm: Normal rate and regular rhythm.     Pulses: Normal pulses.  Pulmonary:     Effort: Pulmonary effort is normal. No respiratory distress.     Breath sounds: No wheezing or rhonchi.  Musculoskeletal:     Right lower leg: No edema.     Left lower leg: No edema.  Skin:    General: Skin is warm  and dry.     Findings: No rash.  Neurological:     General: No focal deficit present.     Mental Status: She is alert and oriented to person, place, and time.  Psychiatric:        Mood and Affect: Mood normal.        Behavior: Behavior normal.     Wt Readings from Last 3 Encounters:  06/10/22 197 lb (89.4 kg)  03/11/22 193 lb (87.5 kg)  01/11/22 198 lb (89.8 kg)    BP 138/80   Pulse 88   Ht $R'5\' 3"'Ic$  (1.6 m)   Wt 197 lb (89.4 kg)   LMP  (LMP Unknown)   BMI 34.90 kg/m   Assessment and Plan: 1. Essential hypertension Clinically stable exam with well controlled BP on lisinopril 10 mg Tolerating medications without side effects at this time. Pt to continue current regimen and low sodium diet; benefits of regular exercise as able discussed. - Comprehensive metabolic panel  2. Hypothyroidism due to acquired atrophy of thyroid Dose changed last visit to 112 mcg.  Recheck labs  - TSH + free T4  3. Hepatic steatosis Continue low fat diet/weight loss efforts - Comprehensive metabolic panel   Partially dictated using Editor, commissioning. Any errors are unintentional.  Halina Maidens, MD [facility]  06/10/2022

## 2022-06-11 LAB — COMPREHENSIVE METABOLIC PANEL
ALT: 21 IU/L (ref 0–32)
AST: 22 IU/L (ref 0–40)
Albumin/Globulin Ratio: 1.8 (ref 1.2–2.2)
Albumin: 4.7 g/dL (ref 3.8–4.9)
Alkaline Phosphatase: 138 IU/L — ABNORMAL HIGH (ref 44–121)
BUN/Creatinine Ratio: 31 — ABNORMAL HIGH (ref 9–23)
BUN: 24 mg/dL (ref 6–24)
Bilirubin Total: 0.4 mg/dL (ref 0.0–1.2)
CO2: 22 mmol/L (ref 20–29)
Calcium: 9.7 mg/dL (ref 8.7–10.2)
Chloride: 102 mmol/L (ref 96–106)
Creatinine, Ser: 0.77 mg/dL (ref 0.57–1.00)
Globulin, Total: 2.6 g/dL (ref 1.5–4.5)
Glucose: 94 mg/dL (ref 70–99)
Potassium: 4.3 mmol/L (ref 3.5–5.2)
Sodium: 143 mmol/L (ref 134–144)
Total Protein: 7.3 g/dL (ref 6.0–8.5)
eGFR: 93 mL/min/{1.73_m2} (ref >59)

## 2022-06-11 LAB — TSH+FREE T4
Free T4: 1.42 ng/dL (ref 0.82–1.77)
TSH: 1.02 u[IU]/mL (ref 0.450–4.500)

## 2022-06-21 ENCOUNTER — Encounter: Payer: Self-pay | Admitting: Internal Medicine

## 2022-06-22 ENCOUNTER — Other Ambulatory Visit: Payer: Self-pay | Admitting: Internal Medicine

## 2022-06-22 DIAGNOSIS — K219 Gastro-esophageal reflux disease without esophagitis: Secondary | ICD-10-CM

## 2022-06-22 NOTE — Telephone Encounter (Signed)
Requested by interface surescripts. Last refill 05/10/22. Too soon . Requested Prescriptions  Refused Prescriptions Disp Refills  . omeprazole (PRILOSEC) 20 MG capsule [Pharmacy Med Name: OMEPRAZOLE 20MG  CAPSULES] 90 capsule 0    Sig: TAKE 1 CAPSULE(20 MG) BY MOUTH TWICE DAILY BEFORE A MEAL     Gastroenterology: Proton Pump Inhibitors Passed - 06/22/2022  3:18 AM      Passed - Valid encounter within last 12 months    Recent Outpatient Visits          1 week ago Essential hypertension   [facility] 08/23/2022, MD   3 months ago Essential hypertension   [facility], MD   5 months ago Class 2 obesity with body mass index (BMI) of 35 to 39.9 without comorbidity   [facility], MD   6 months ago Vertigo   [facility], MD   8 months ago Upper respiratory tract infection, unspecified type   [facility], MD      Future Appointments            In 3 months Vanga, [name], MD [facility]   In 6 months Loel Dubonnet, Judithann Graves, MD [facility], [facility]

## 2022-07-11 ENCOUNTER — Other Ambulatory Visit: Payer: Self-pay | Admitting: Internal Medicine

## 2022-07-11 ENCOUNTER — Encounter: Payer: Self-pay | Admitting: Internal Medicine

## 2022-07-11 DIAGNOSIS — E282 Polycystic ovarian syndrome: Secondary | ICD-10-CM

## 2022-07-12 ENCOUNTER — Other Ambulatory Visit: Payer: Self-pay | Admitting: Internal Medicine

## 2022-07-12 DIAGNOSIS — E282 Polycystic ovarian syndrome: Secondary | ICD-10-CM

## 2022-07-12 DIAGNOSIS — I10 Essential (primary) hypertension: Secondary | ICD-10-CM

## 2022-07-12 MED ORDER — METFORMIN HCL 500 MG PO TABS: ORAL_TABLET | ORAL | 1 refills | Status: DC

## 2022-07-12 MED ORDER — LISINOPRIL 10 MG PO TABS: ORAL_TABLET | ORAL | 1 refills | Status: DC

## 2022-07-12 NOTE — Telephone Encounter (Signed)
Duplicate request. Receipt received.  Requested Prescriptions  Pending Prescriptions Disp Refills  . lisinopril (ZESTRIL) 10 MG tablet [Pharmacy Med Name: LISINOPRIL 10MG TABLETS] 90 tablet 0    Sig: TAKE 1 TABLET(10 MG) BY MOUTH DAILY     Cardiovascular:  ACE Inhibitors Passed - 07/11/2022  7:05 AM      Passed - Cr in normal range and within 180 days    Creatinine, Ser  Date Value Ref Range Status  06/10/2022 0.77 0.57 - 1.00 mg/dL Final         Passed - K in normal range and within 180 days    Potassium  Date Value Ref Range Status  06/10/2022 4.3 3.5 - 5.2 mmol/L Final         Passed - Patient is not pregnant      Passed - Last BP in normal range    BP Readings from Last 1 Encounters:  06/10/22 138/80         Passed - Valid encounter within last 6 months    Recent Outpatient Visits          1 month ago Essential hypertension   [facility] Glean Hess, MD   4 months ago Essential hypertension   [facility] Glean Hess, MD   6 months ago Class 2 obesity with body mass index (BMI) of 35 to 39.9 without comorbidity   [facility], [facility]   6 months ago [facility] Glean Hess, MD   8 months ago Upper respiratory tract infection, unspecified type   [facility] Glean Hess, MD      Future Appointments            In 2 months Vanga, Tally Due, MD [place]   In 5 months Army Melia, Jesse Sans, MD [facility]           . metFORMIN (GLUCOPHAGE) 500 MG tablet [Pharmacy Med Name: METFORMIN 500MG TABLETS] 90 tablet 0    Sig: TAKE 1 TABLET(500 MG) BY MOUTH DAILY WITH BREAKFAST     Endocrinology:  Diabetes - Biguanides Failed - 07/11/2022  7:05 AM      Failed - B12 Level in normal range and within 720 days    No results found for: "VITAMINB12"       Failed - CBC within normal limits and completed in the last 12 months    WBC  Date Value Ref Range  Status  12/26/2019 7.0 4.0 - 10.5 K/uL Final   RBC  Date Value Ref Range Status  12/26/2019 5.37 (H) 3.87 - 5.11 MIL/uL Final   Hemoglobin  Date Value Ref Range Status  12/26/2019 14.6 12.0 - 15.0 g/dL Final  02/01/2019 14.1 11.1 - 15.9 g/dL Final   HCT  Date Value Ref Range Status  12/26/2019 44.2 36.0 - 46.0 % Final   Hematocrit  Date Value Ref Range Status  02/01/2019 42.4 34.0 - 46.6 % Final   MCHC  Date Value Ref Range Status  12/26/2019 33.0 30.0 - 36.0 g/dL Final   [facility]  Date Value Ref Range Status  12/26/2019 27.2 26.0 - 34.0 pg Final   MCV  Date Value Ref Range Status  12/26/2019 82.3 80.0 - 100.0 fL Final  02/01/2019 85 79 - 97 fL Final   No results found for: "PLTCOUNTKUC", "LABPLAT", "POCPLA" RDW  Date Value Ref Range Status  12/26/2019 13.4 11.5 - 15.5 %  Final  02/01/2019 13.4 11.7 - 15.4 % Final         Passed - Cr in normal range and within 360 days    Creatinine, Ser  Date Value Ref Range Status  06/10/2022 0.77 0.57 - 1.00 mg/dL Final         Passed - HBA1C is between 0 and 7.9 and within 180 days    Hgb A1c MFr Bld  Date Value Ref Range Status  03/11/2022 5.1 4.8 - 5.6 % Final    Comment:             Prediabetes: 5.7 - 6.4          Diabetes: >6.4          Glycemic control for adults with diabetes: <7.0          Passed - eGFR in normal range and within 360 days    GFR calc Af Amer  Date Value Ref Range Status  02/07/2020 87 >59 mL/min/1.73 Final   GFR calc non Af Amer  Date Value Ref Range Status  02/07/2020 75 >59 mL/min/1.73 Final   eGFR  Date Value Ref Range Status  06/10/2022 93 >59 mL/min/1.73 Final         Passed - Valid encounter within last 6 months    Recent Outpatient Visits          1 month ago Essential hypertension   [facility] Glean Hess, MD   4 months ago Essential hypertension   [facility] Glean Hess, MD   6 months ago Class 2 obesity with body mass index (BMI) of 35 to  39.9 without comorbidity   [facility] Glean Hess, MD   6 months ago Vertigo   [facility] Glean Hess, MD   8 months ago Upper respiratory tract infection, unspecified type   [facility] Glean Hess, MD      Future Appointments            In 2 months Vanga, Tally Due, MD [place]   In 5 months Army Melia, Jesse Sans, MD [facility], [facility]

## 2022-08-05 ENCOUNTER — Other Ambulatory Visit: Payer: Self-pay | Admitting: Internal Medicine

## 2022-08-05 DIAGNOSIS — K219 Gastro-esophageal reflux disease without esophagitis: Secondary | ICD-10-CM

## 2022-08-06 ENCOUNTER — Other Ambulatory Visit: Payer: Self-pay | Admitting: Internal Medicine

## 2022-08-06 DIAGNOSIS — K219 Gastro-esophageal reflux disease without esophagitis: Secondary | ICD-10-CM

## 2022-08-07 ENCOUNTER — Encounter: Payer: Self-pay | Admitting: Internal Medicine

## 2022-08-07 MED ORDER — OMEPRAZOLE 20 MG PO CPDR: 20.0000 mg | DELAYED_RELEASE_CAPSULE | Freq: Two times a day (BID) | ORAL | 0 refills | Status: DC

## 2022-08-10 ENCOUNTER — Telehealth: Payer: BC Managed Care – PPO | Admitting: Family Medicine

## 2022-08-10 DIAGNOSIS — M5441 Lumbago with sciatica, right side: Secondary | ICD-10-CM

## 2022-08-10 MED ORDER — NAPROXEN 500 MG PO TABS: 500.0000 mg | ORAL_TABLET | Freq: Two times a day (BID) | ORAL | 0 refills | Status: DC

## 2022-08-10 MED ORDER — CYCLOBENZAPRINE HCL 10 MG PO TABS: 10.0000 mg | ORAL_TABLET | Freq: Three times a day (TID) | ORAL | 0 refills | Status: DC | PRN

## 2022-08-10 NOTE — Progress Notes (Signed)

## 2022-08-17 ENCOUNTER — Encounter: Payer: Self-pay | Admitting: Internal Medicine

## 2022-08-17 ENCOUNTER — Ambulatory Visit (INDEPENDENT_AMBULATORY_CARE_PROVIDER_SITE_OTHER): Payer: BC Managed Care – PPO | Admitting: Internal Medicine

## 2022-08-17 VITALS — BP 134/82 | HR 88 | Ht 63.0 in | Wt 203.0 lb

## 2022-08-17 DIAGNOSIS — H6121 Impacted cerumen, right ear: Secondary | ICD-10-CM | POA: Diagnosis not present

## 2022-08-17 NOTE — Progress Notes (Signed)
Date:  08/17/2022   Name:  Cindy Pena   DOB:  Jul 06, 1971   MRN:  [id number]   Chief Complaint: Ear Fullness  Ear Fullness  There is pain in the right ear. This is a new problem. Episode onset: X4 days. The problem has been unchanged. There has been no fever. The pain is at a severity of 0/10. Associated symptoms include hearing loss and rhinorrhea. Pertinent negatives include no ear discharge or headaches. She has tried ear drops (peroxide) for the symptoms. The treatment provided no relief.    Lab Results  Component Value Date   NA 143 06/10/2022   K 4.3 06/10/2022   CO2 22 06/10/2022   GLUCOSE 94 06/10/2022   BUN 24 06/10/2022   CREATININE 0.77 06/10/2022   CALCIUM 9.7 06/10/2022   EGFR 93 06/10/2022   GFRNONAA 75 02/07/2020   Lab Results  Component Value Date   CHOL 193 03/11/2022   HDL 58 03/11/2022   LDLCALC 114 (H) 03/11/2022   TRIG 118 03/11/2022   CHOLHDL 3.3 03/11/2022   Lab Results  Component Value Date   TSH 1.020 06/10/2022   Lab Results  Component Value Date   HGBA1C 5.1 03/11/2022   Lab Results  Component Value Date   WBC 7.0 12/26/2019   HGB 14.6 12/26/2019   HCT 44.2 12/26/2019   MCV 82.3 12/26/2019   PLT 155 12/26/2019   Lab Results  Component Value Date   ALT 21 06/10/2022   AST 22 06/10/2022   ALKPHOS 138 (H) 06/10/2022   BILITOT 0.4 06/10/2022   Lab Results  Component Value Date   VD25OH 23.1 (L) 06/30/2021     Review of Systems  Constitutional:  Negative for chills, fatigue and fever.  HENT:  Positive for hearing loss and rhinorrhea. Negative for ear discharge and ear pain.   Respiratory:  Negative for chest tightness and shortness of breath.   Cardiovascular:  Negative for chest pain.  Neurological:  Negative for dizziness and headaches.    Patient Active Problem List   Diagnosis Date Noted   Generalized anxiety disorder 07/09/2021   Hepatic steatosis 02/20/2019   Mild hyperlipidemia 02/02/2019    Hypothyroidism due to acquired atrophy of thyroid 02/01/2019   Essential hypertension 05/21/2018   Eczema 10/20/2017   Class 2 obesity with body mass index (BMI) of 35 to 39.9 without comorbidity 04/19/2016   Gastroesophageal reflux disease without esophagitis 07/08/2015   PCOS (polycystic ovarian syndrome) 07/08/2015    Allergies  Allergen Reactions   Darvon [Propoxyphene] Other (See Comments)    Chest Pain.   Penicillins Other (See Comments)    Migraines.   Tramadol Hcl Rash   Other Other (See Comments)    Past Surgical History:  Procedure Laterality Date   c section  1996   September- SON Birth   CHOLECYSTECTOMY  2003   COLONOSCOPY WITH PROPOFOL N/A 02/11/2021   Procedure: COLONOSCOPY WITH PROPOFOL;  Surgeon: Lin Landsman, MD;  Location: [facility] ENDOSCOPY;  Service: Gastroenterology;  Laterality: N/A;   DILATION AND CURETTAGE OF UTERUS  1996 & 2006   ooph Right 1995   Right tube removed- mass was on tube.   TOTAL ABDOMINAL HYSTERECTOMY  2009   Right ovary attached to center of stomach.    Social History   Tobacco Use   Smoking status: Never   Smokeless tobacco: Never  Vaping Use   Vaping Use: Never used  Substance Use Topics   Alcohol use: No  Drug use: No     Medication list has been reviewed and updated.  Current Meds  Medication Sig   cyclobenzaprine (FLEXERIL) 10 MG tablet Take 1 tablet (10 mg total) by mouth 3 (three) times daily as needed for muscle spasms.   docusate sodium (COLACE) 100 MG capsule Take 100 mg by mouth 2 (two) times daily.   levothyroxine (SYNTHROID) 112 MCG tablet Take 1 tablet (112 mcg total) by mouth daily before breakfast.   linaclotide (LINZESS) 145 MCG CAPS capsule Take 1 capsule (145 mcg total) by mouth daily before breakfast.   lisinopril (ZESTRIL) 10 MG tablet TAKE 1 TABLET(10 MG) BY MOUTH DAILY   metFORMIN (GLUCOPHAGE) 500 MG tablet TAKE 1 TABLET(500 MG) BY MOUTH DAILY WITH BREAKFAST   Misc Natural Products (ESTROVEN +  ENERGY MAX STRENGTH PO) Take by mouth daily.   omeprazole (PRILOSEC) 20 MG capsule Take 1 capsule (20 mg total) by mouth 2 (two) times daily before a meal. (Patient taking differently: Take 20 mg by mouth daily.)   [DISCONTINUED] naproxen (NAPROSYN) 500 MG tablet Take 1 tablet (500 mg total) by mouth 2 (two) times daily with a meal.       08/17/2022    1:59 PM 06/10/2022    3:32 PM 03/11/2022    8:21 AM 01/11/2022    3:36 PM  GAD 7 : Generalized Anxiety Score  Nervous, Anxious, on Edge _0 Control/stop worrying _1 0  Worry too much - different things _2 0  Trouble relaxing 1 1 0 0  Restless 1 1 0 1  Easily annoyed or irritable 0 0 0 0  Afraid - awful might happen _3 0  Total GAD 7 Score _4 Anxiety Difficulty Not difficult at all Not difficult at all         08/17/2022    1:58 PM 06/10/2022    3:32 PM 03/11/2022    8:21 AM  Depression screen PHQ 2/9  Decreased Interest 1 0 0  Down, Depressed, Hopeless 1 0 0  PHQ - 2 Score 2 0 0  Altered sleeping _5 Tired, decreased energy _6 Change in appetite 2 2 0  Feeling bad or failure about yourself  0 1 0  Trouble concentrating 0 0 0  Moving slowly or fidgety/restless 0 0 0  Suicidal thoughts 0 0 0  PHQ-9 Score _7 Difficult doing work/chores Somewhat difficult Not difficult at all Not difficult at all    BP Readings from Last 3 Encounters:  08/17/22 134/82  06/10/22 138/80  03/11/22 114/78    Physical Exam Vitals and nursing note reviewed.  Constitutional:      General: She is not in acute distress.    Appearance: She is well-developed.  HENT:     Head: Normocephalic and atraumatic.     Right Ear: Decreased hearing noted. There is impacted cerumen.     Left Ear: Hearing, tympanic membrane and ear canal normal.     Ears:     Comments: Right ear canal irrigated with loosening of cerumen.  Large amount was then removed with a curette.  TM normal in appearance.  Pt tolerated procedure  well. Cardiovascular:     Rate and Rhythm: Normal rate and regular rhythm.  Pulmonary:     Effort: Pulmonary effort is normal. No respiratory distress.     Breath sounds: No wheezing or rhonchi.  Lymphadenopathy:  Cervical: No cervical adenopathy.  Skin:    General: Skin is warm and dry.     Findings: No rash.  Neurological:     Mental Status: She is alert and oriented to person, place, and time.  Psychiatric:        Mood and Affect: Mood normal.        Behavior: Behavior normal.     Wt Readings from Last 3 Encounters:  08/17/22 203 lb (92.1 kg)  06/10/22 197 lb (89.4 kg)  03/11/22 193 lb (87.5 kg)    BP 134/82   Pulse 88   Ht _0  (1.6 m)   Wt 203 lb (92.1 kg)   LMP  (LMP Unknown)   SpO2 98%   BMI 35.96 kg/m   Assessment and Plan: 1. Hearing loss due to cerumen impaction, right External canal successfully irrigated and debrided of cerumen with return of  subjectively normal hearing   Partially dictated using Editor, commissioning. Any errors are unintentional.  Halina Maidens, MD [facility]  08/17/2022

## 2022-09-06 ENCOUNTER — Other Ambulatory Visit: Payer: Self-pay | Admitting: Internal Medicine

## 2022-09-06 DIAGNOSIS — E034 Atrophy of thyroid (acquired): Secondary | ICD-10-CM

## 2022-09-06 NOTE — Telephone Encounter (Signed)
Requested Prescriptions  Pending Prescriptions Disp Refills  . levothyroxine (SYNTHROID) 112 MCG tablet [Pharmacy Med Name: LEVOTHYROXINE 0.112MG  (112MCG) TABS] 90 tablet 1    Sig: TAKE 1 TABLET(112 MCG) BY MOUTH DAILY BEFORE BREAKFAST     Endocrinology:  Hypothyroid Agents Passed - 09/06/2022 11:48 AM      Passed - TSH in normal range and within 360 days    TSH  Date Value Ref Range Status  06/10/2022 1.020 0.450 - 4.500 uIU/mL Final         Passed - Valid encounter within last 12 months    Recent Outpatient Visits          2 weeks ago Hearing loss due to cerumen impaction, right   [place] [facility] at [facility], Jesse Sans, MD   2 months ago Essential hypertension   [place] [facility] at [facility], Jesse Sans, MD   5 months ago Essential hypertension   [place] [facility] at [facility], Jesse Sans, MD   7 months ago Class 2 obesity with body mass index (BMI) of 35 to 39.9 without comorbidity   [place] [facility] at [facility], Jesse Sans, MD   8 months ago Vertigo   [facility] at [facility], Jesse Sans, MD      Future Appointments            In 3 weeks Vanga, Tally Due, MD [place]   In 3 months Army Melia, Jesse Sans, MD [place] [facility] at [facility]

## 2022-09-08 ENCOUNTER — Encounter: Payer: Self-pay | Admitting: Gastroenterology

## 2022-09-09 ENCOUNTER — Telehealth: Payer: Self-pay

## 2022-09-09 MED ORDER — LUBIPROSTONE 24 MCG PO CAPS: 24.0000 ug | ORAL_CAPSULE | Freq: Two times a day (BID) | ORAL | 0 refills | Status: DC

## 2022-09-09 NOTE — Telephone Encounter (Signed)
Submitted PA through cover my meds for the Amitiza 53mcg. Waiting on response from insurance company

## 2022-09-12 NOTE — Telephone Encounter (Signed)
Amitiza is excluded from patient insurance plan and can not be covered.Please advise

## 2022-09-12 NOTE — Telephone Encounter (Signed)
Informed patient of this information and she states she has some linzess and she will stay on the medication

## 2022-09-12 NOTE — Telephone Encounter (Signed)
Linzess is covered

## 2022-09-12 NOTE — Telephone Encounter (Signed)
Please inform patient that Baker Pierini is not covered by her insurance.  If she would like, she can pick up Linzess samples  RV

## 2022-09-20 ENCOUNTER — Other Ambulatory Visit: Payer: Self-pay | Admitting: Internal Medicine

## 2022-09-20 ENCOUNTER — Encounter: Payer: Self-pay | Admitting: Internal Medicine

## 2022-09-20 DIAGNOSIS — K219 Gastro-esophageal reflux disease without esophagitis: Secondary | ICD-10-CM

## 2022-09-20 MED ORDER — LINACLOTIDE 145 MCG PO CAPS: 145.0000 ug | ORAL_CAPSULE | Freq: Every day | ORAL | 0 refills | Status: DC

## 2022-09-20 NOTE — Telephone Encounter (Signed)
Please review.  KP

## 2022-09-20 NOTE — Telephone Encounter (Signed)
Requested Prescriptions  Pending Prescriptions Disp Refills  . omeprazole (PRILOSEC) 20 MG capsule [Pharmacy Med Name: OMEPRAZOLE 20MG  CAPSULES] 90 capsule 0    Sig: TAKE 1 CAPSULE(20 MG) BY MOUTH TWICE DAILY BEFORE A MEAL     Gastroenterology: Proton Pump Inhibitors Passed - 09/20/2022 10:49 AM      Passed - Valid encounter within last 12 months    Recent Outpatient Visits          1 month ago Hearing loss due to cerumen impaction, right   [place] [facility] at [facility], Jesse Sans, MD   3 months ago Essential hypertension   [place] [facility] at [facility], Jesse Sans, MD   6 months ago Essential hypertension   [place] [facility] at [facility], Jesse Sans, MD   8 months ago Class 2 obesity with body mass index (BMI) of 35 to 39.9 without comorbidity    Primary Care and Sports Medicine at [facility], Jesse Sans, MD   9 months ago Vertigo   [facility] at [facility], Jesse Sans, MD      Future Appointments            In 1 week Vanga, Tally Due, MD [place]   In 3 months Army Melia, Jesse Sans, MD [place] [facility] at [facility]

## 2022-09-22 ENCOUNTER — Other Ambulatory Visit: Payer: Self-pay

## 2022-09-27 ENCOUNTER — Ambulatory Visit: Payer: BC Managed Care – PPO | Admitting: Gastroenterology

## 2022-09-28 ENCOUNTER — Ambulatory Visit: Payer: BC Managed Care – PPO | Admitting: Gastroenterology

## 2022-11-11 ENCOUNTER — Encounter: Payer: Self-pay | Admitting: Internal Medicine

## 2022-11-18 ENCOUNTER — Encounter: Payer: Self-pay | Admitting: Internal Medicine

## 2022-11-23 ENCOUNTER — Telehealth: Payer: BC Managed Care – PPO | Admitting: Emergency Medicine

## 2022-11-23 DIAGNOSIS — H9209 Otalgia, unspecified ear: Secondary | ICD-10-CM | POA: Diagnosis not present

## 2022-11-23 MED ORDER — AZITHROMYCIN 250 MG PO TABS: ORAL_TABLET | ORAL | 0 refills | Status: DC

## 2022-11-23 NOTE — Progress Notes (Signed)
Otalgia E-Visit   It is, which is why I ask.  Your penicillin contraindication is listed as an "intolerance" and not a true allergy, so wondering if amox would work.    We can avoid them and give you a z-pak.  I don't love z-paks, but our options might be limited if you can't do penicillins.  I've sent a prescription to your pharmacy.    GET HELP RIGHT AWAY IF: Fever is over 102.2 degrees. You develop progressive ear pain or hearing loss. Ear symptoms persist longer than 3 days after treatment.  MAKE SURE YOU: Understand these instructions. Will watch your condition. Will get help right away if you are not doing well or get worse.  TO PREVENT SWIMMER'S EAR: Use a bathing cap or custom fitted swim molds to keep your ears dry. Towel off after swimming to dry your ears. Tilt your head or pull your earlobes to allow the water to escape your ear canal. If there is still water in your ears, consider using a hairdryer on the lowest setting.  Thank you for choosing an e-visit.  Your e-visit answers were reviewed by a board certified advanced clinical practitioner to complete your personal care plan. Depending upon the condition, your plan could have included both over the counter or prescription medications.  Please review your pharmacy choice. Make sure the pharmacy is open so you can pick up the prescription now. If there is a problem, you may contact your provider through [employer] and have the prescription routed to another pharmacy.  Your safety is important to [place]. If you have drug allergies check your prescription carefully.   For the next 24 hours you can use MyChart to ask questions about today's visit, request a non-urgent call back, or ask for a work or school excuse. You will get an email with a survey after your eVisit asking about your experience. We would appreciate your feedback. I hope that your e-visit has been valuable and will aid in your  recovery.  Approximately 5 minutes was used in reviewing the patient's chart, questionnaire, prescribing medications, and documentation.

## 2022-12-07 ENCOUNTER — Ambulatory Visit (INDEPENDENT_AMBULATORY_CARE_PROVIDER_SITE_OTHER): Payer: BC Managed Care – PPO | Admitting: Internal Medicine

## 2022-12-07 ENCOUNTER — Encounter: Payer: Self-pay | Admitting: Internal Medicine

## 2022-12-07 VITALS — BP 124/76 | HR 93 | Ht 63.0 in | Wt 220.0 lb

## 2022-12-07 DIAGNOSIS — H6992 Unspecified Eustachian tube disorder, left ear: Secondary | ICD-10-CM | POA: Diagnosis not present

## 2022-12-07 NOTE — Progress Notes (Signed)
Date:  12/07/2022   Name:  Cindy Pena   DOB:  July 23, 1971   MRN:  [id number]   Chief Complaint: Ear Pain  Otalgia  There is pain in the left ear. This is a new problem. The current episode started today. The problem has been unchanged. There has been no fever. Associated symptoms include headaches. Pertinent negatives include no coughing, diarrhea, ear discharge, hearing loss, sore throat or vomiting. She has tried nothing for the symptoms.    Lab Results  Component Value Date   NA 143 06/10/2022   K 4.3 06/10/2022   CO2 22 06/10/2022   GLUCOSE 94 06/10/2022   BUN 24 06/10/2022   CREATININE 0.77 06/10/2022   CALCIUM 9.7 06/10/2022   EGFR 93 06/10/2022   GFRNONAA 75 02/07/2020   Lab Results  Component Value Date   CHOL 193 03/11/2022   HDL 58 03/11/2022   LDLCALC 114 (H) 03/11/2022   TRIG 118 03/11/2022   CHOLHDL 3.3 03/11/2022   Lab Results  Component Value Date   TSH 1.020 06/10/2022   Lab Results  Component Value Date   HGBA1C 5.1 03/11/2022   Lab Results  Component Value Date   WBC 7.0 12/26/2019   HGB 14.6 12/26/2019   HCT 44.2 12/26/2019   MCV 82.3 12/26/2019   PLT 155 12/26/2019   Lab Results  Component Value Date   ALT 21 06/10/2022   AST 22 06/10/2022   ALKPHOS 138 (H) 06/10/2022   BILITOT 0.4 06/10/2022   Lab Results  Component Value Date   VD25OH 23.1 (L) 06/30/2021     Review of Systems  Constitutional:  Negative for chills, fatigue and fever.  HENT:  Positive for ear pain. Negative for ear discharge, hearing loss and sore throat.   Respiratory:  Negative for cough.   Cardiovascular:  Negative for chest pain.  Gastrointestinal:  Negative for diarrhea and vomiting.  Neurological:  Positive for headaches.    Patient Active Problem List   Diagnosis Date Noted   Generalized anxiety disorder 07/09/2021   Hepatic steatosis 02/20/2019   Mild hyperlipidemia 02/02/2019   Hypothyroidism due to acquired atrophy of thyroid  02/01/2019   Essential hypertension 05/21/2018   Eczema 10/20/2017   Class 2 obesity with body mass index (BMI) of 35 to 39.9 without comorbidity 04/19/2016   Gastroesophageal reflux disease without esophagitis 07/08/2015   PCOS (polycystic ovarian syndrome) 07/08/2015    Allergies  Allergen Reactions   Darvon [Propoxyphene] Other (See Comments)    Chest Pain.   Penicillins Other (See Comments)    Migraines.   Tramadol Hcl Rash   Other Other (See Comments)    Past Surgical History:  Procedure Laterality Date   c section  1996   September- SON Birth   CHOLECYSTECTOMY  2003   COLONOSCOPY WITH PROPOFOL N/A 02/11/2021   Procedure: COLONOSCOPY WITH PROPOFOL;  Surgeon: Lin Landsman, MD;  Location: [facility] ENDOSCOPY;  Service: Gastroenterology;  Laterality: N/A;   DILATION AND CURETTAGE OF UTERUS  1996 & 2006   ooph Right 1995   Right tube removed- mass was on tube.   TOTAL ABDOMINAL HYSTERECTOMY  2009   Right ovary attached to center of stomach.    Social History   Tobacco Use   Smoking status: Never   Smokeless tobacco: Never  Vaping Use   Vaping Use: Never used  Substance Use Topics   Alcohol use: No   Drug use: No     Medication list has been  reviewed and updated.  Current Meds  Medication Sig   docusate sodium (COLACE) 100 MG capsule Take 100 mg by mouth 2 (two) times daily.   levothyroxine (SYNTHROID) 175 MCG tablet Take 1 tablet every day by oral route.   linaclotide (LINZESS) 145 MCG CAPS capsule Take 1 capsule (145 mcg total) by mouth daily before breakfast.   lisinopril (ZESTRIL) 10 MG tablet TAKE 1 TABLET(10 MG) BY MOUTH DAILY   metFORMIN (GLUCOPHAGE) 500 MG tablet TAKE 1 TABLET(500 MG) BY MOUTH DAILY WITH BREAKFAST   Misc Natural Products (ESTROVEN + ENERGY MAX STRENGTH PO) Take by mouth daily.   omeprazole (PRILOSEC) 20 MG capsule TAKE 1 CAPSULE(20 MG) BY MOUTH TWICE DAILY BEFORE A MEAL       12/07/2022    3:32 PM 08/17/2022    1:59 PM 06/10/2022     3:32 PM 03/11/2022    8:21 AM  GAD 7 : Generalized Anxiety Score  Nervous, Anxious, on Edge 2 2 2 1  Control/stop worrying 1 1 1 1  Worry too much - different things 1 2 1 1  Trouble relaxing 1 1 1 0  Restless 1 1 1 0  Easily annoyed or irritable 0 0 0 0  Afraid - awful might happen 2 2 2 1  Total GAD 7 Score 8 9 8 4  Anxiety Difficulty Not difficult at all Not difficult at all Not difficult at all        12/07/2022    3:32 PM 08/17/2022    1:58 PM 06/10/2022    3:32 PM  Depression screen PHQ 2/9  Decreased Interest 1 1 0  Down, Depressed, Hopeless 2 1 0  PHQ - 2 Score 3 2 0  Altered sleeping 1 2 2  Tired, decreased energy 1 1 1  Change in appetite 2 2 2  Feeling bad or failure about yourself  2 0 1  Trouble concentrating 0 0 0  Moving slowly or fidgety/restless 0 0 0  Suicidal thoughts 0 0 0  PHQ-9 Score 9 7 6  Difficult doing work/chores Not difficult at all Somewhat difficult Not difficult at all    BP Readings from Last 3 Encounters:  12/07/22 124/76  08/17/22 134/82  06/10/22 138/80    Physical Exam Vitals and nursing note reviewed.  Constitutional:      General: She is not in acute distress.    Appearance: She is well-developed.  HENT:     Head: Normocephalic and atraumatic.     Jaw: There is normal jaw occlusion.     Right Ear: Tympanic membrane is not erythematous or retracted.     Left Ear: Tympanic membrane is retracted. Tympanic membrane is not erythematous.     Nose:     Right Sinus: No maxillary sinus tenderness or frontal sinus tenderness.     Left Sinus: No maxillary sinus tenderness or frontal sinus tenderness.  Cardiovascular:     Rate and Rhythm: Normal rate and regular rhythm.  Pulmonary:     Effort: Pulmonary effort is normal. No respiratory distress.     Breath sounds: Normal breath sounds.  Skin:    General: Skin is warm and dry.     Findings: No rash.  Neurological:     Mental Status: She is alert and oriented to person, place, and  time.  Psychiatric:        Mood and Affect: Mood normal.        Behavior: Behavior normal.     Wt Readings   from Last 3 Encounters:  12/07/22 220 lb (99.8 kg)  08/17/22 203 lb (92.1 kg)  06/10/22 197 lb (89.4 kg)    BP 124/76   Pulse 93   Ht 5' 3" (1.6 m)   Wt 220 lb (99.8 kg)   LMP  (LMP Unknown)   SpO2 97%   BMI 38.97 kg/m   Assessment and Plan: Problem List Items Addressed This Visit   None Visit Diagnoses     Eustachian tube dysfunction, left    -  Primary   Recommend trial of sudadfed 30 mg bid can add flonase if needed no evidence of bacterial infection        Partially dictated using Dragon software. Any errors are unintentional.  Laura Berglund, MD [facility]  12/07/2022     

## 2022-12-07 NOTE — Patient Instructions (Signed)
Try sudafed 30 mg twice a day for ear congestion  Also add Flonase Nasal spray if needed.

## 2022-12-14 ENCOUNTER — Encounter: Payer: Self-pay | Admitting: Internal Medicine

## 2022-12-23 ENCOUNTER — Encounter: Payer: BC Managed Care – PPO | Admitting: Internal Medicine

## 2022-12-27 ENCOUNTER — Telehealth: Payer: Self-pay | Admitting: Internal Medicine

## 2022-12-27 ENCOUNTER — Encounter: Payer: Self-pay | Admitting: Internal Medicine

## 2022-12-27 NOTE — Telephone Encounter (Signed)
Rescheduled to 01/20/2023

## 2022-12-27 NOTE — Telephone Encounter (Signed)
Copied from [place] [id number]. Topic: Appointment Scheduling - Scheduling Inquiry for Clinic >> Dec 27, 2022  9:35 AM Tiffany B wrote: Patient received a call regarding rescheduling her physical appointment for 12/28/2022 at 4pm. Patient would like another 4pm slot for her physical, please advise patient directly

## 2022-12-28 ENCOUNTER — Encounter: Payer: BC Managed Care – PPO | Admitting: Internal Medicine

## 2023-01-17 ENCOUNTER — Encounter: Payer: Self-pay | Admitting: Internal Medicine

## 2023-01-20 ENCOUNTER — Encounter: Payer: Self-pay | Admitting: Internal Medicine

## 2023-01-20 ENCOUNTER — Ambulatory Visit (INDEPENDENT_AMBULATORY_CARE_PROVIDER_SITE_OTHER): Payer: BC Managed Care – PPO | Admitting: Internal Medicine

## 2023-01-20 VITALS — BP 130/78 | HR 88 | Ht 63.0 in | Wt 233.0 lb

## 2023-01-20 DIAGNOSIS — G5601 Carpal tunnel syndrome, right upper limb: Secondary | ICD-10-CM

## 2023-01-20 DIAGNOSIS — E282 Polycystic ovarian syndrome: Secondary | ICD-10-CM

## 2023-01-20 DIAGNOSIS — E785 Hyperlipidemia, unspecified: Secondary | ICD-10-CM

## 2023-01-20 DIAGNOSIS — Z0000 Encounter for general adult medical examination without abnormal findings: Secondary | ICD-10-CM

## 2023-01-20 DIAGNOSIS — K219 Gastro-esophageal reflux disease without esophagitis: Secondary | ICD-10-CM

## 2023-01-20 DIAGNOSIS — I10 Essential (primary) hypertension: Secondary | ICD-10-CM

## 2023-01-20 DIAGNOSIS — Z1231 Encounter for screening mammogram for malignant neoplasm of breast: Secondary | ICD-10-CM

## 2023-01-20 DIAGNOSIS — E034 Atrophy of thyroid (acquired): Secondary | ICD-10-CM | POA: Diagnosis not present

## 2023-01-20 DIAGNOSIS — K5901 Slow transit constipation: Secondary | ICD-10-CM

## 2023-01-20 DIAGNOSIS — E669 Obesity, unspecified: Secondary | ICD-10-CM

## 2023-01-20 MED ORDER — LINACLOTIDE 145 MCG PO CAPS: 145.0000 ug | ORAL_CAPSULE | Freq: Every day | ORAL | 1 refills | Status: DC

## 2023-01-20 MED ORDER — OMEPRAZOLE 20 MG PO CPDR: DELAYED_RELEASE_CAPSULE | ORAL | 1 refills | Status: DC

## 2023-01-20 MED ORDER — BUPROPION HCL ER (SR) 100 MG PO TB12: 100.0000 mg | ORAL_TABLET | Freq: Two times a day (BID) | ORAL | 0 refills | Status: DC

## 2023-01-20 MED ORDER — METFORMIN HCL 500 MG PO TABS: ORAL_TABLET | ORAL | 1 refills | Status: DC

## 2023-01-20 NOTE — Patient Instructions (Addendum)
Call [facility] to schedule your mammogram at [phone].  Wear a short cock-up wrist splint on the right while sleeping to reduce symptoms of carpal tunnel syndrome.

## 2023-01-20 NOTE — Assessment & Plan Note (Signed)
On metformin for metabolic syndrome

## 2023-01-20 NOTE — Progress Notes (Signed)
Date:  01/20/2023   Name:  MYRON SCHUE   DOB:  [birth year]   MRN:  [id number]   Chief Complaint: Annual Exam DESHANAE BICE is a 52 y.o. female who presents today for her Complete Annual Exam. She feels well. She reports exercising exercise bike 1 day a week. She reports she is sleeping poorly. Breast complaints none. She continues to struggle with her weight and over eating.  Her insurance does not cover any weight loss medications; she did not benefit from phentermine. She is considering a bariatric clinic that provides compounded Ozempic.  Mammogram: 02/2022 DEXA: none Pap smear: discontinued Colonoscopy: 02/2021 repeat 10 yrs  Health Maintenance Due  Topic Date Due   Zoster Vaccines- Shingrix (1 of 2) Never done    Immunization History  Administered Date(s) Administered   PFIZER(Purple Top)SARS-COV-2 Vaccination 08/28/2020, 09/18/2020   Tdap 07/08/2015    Hypertension This is a chronic problem. The problem is controlled. Pertinent negatives include no chest pain, headaches, palpitations or shortness of breath. Past treatments include ACE inhibitors. Identifiable causes of hypertension include a thyroid problem.  Thyroid Problem Presents for follow-up visit. Symptoms include anxiety and constipation (managed well with Linzess). Patient reports no diarrhea, fatigue, palpitations or tremors. The symptoms have been stable.  Gastroesophageal Reflux She complains of heartburn. She reports no abdominal pain, no chest pain, no coughing or no wheezing. This is a recurrent problem. The problem occurs rarely. Pertinent negatives include no fatigue. She has tried a PPI for the symptoms. The treatment provided significant relief.    Lab Results  Component Value Date   NA 143 06/10/2022   K 4.3 06/10/2022   CO2 22 06/10/2022   GLUCOSE 94 06/10/2022   BUN 24 06/10/2022   CREATININE 0.77 06/10/2022   CALCIUM 9.7 06/10/2022   EGFR 93 06/10/2022   GFRNONAA 75  02/07/2020   Lab Results  Component Value Date   CHOL 193 03/11/2022   HDL 58 03/11/2022   LDLCALC 114 (H) 03/11/2022   TRIG 118 03/11/2022   CHOLHDL 3.3 03/11/2022   Lab Results  Component Value Date   TSH 1.020 06/10/2022   Lab Results  Component Value Date   HGBA1C 5.1 03/11/2022   Lab Results  Component Value Date   WBC 7.0 12/26/2019   HGB 14.6 12/26/2019   HCT 44.2 12/26/2019   MCV 82.3 12/26/2019   PLT 155 12/26/2019   Lab Results  Component Value Date   ALT 21 06/10/2022   AST 22 06/10/2022   ALKPHOS 138 (H) 06/10/2022   BILITOT 0.4 06/10/2022   Lab Results  Component Value Date   VD25OH 23.1 (L) 06/30/2021     Review of Systems  Constitutional:  Positive for unexpected weight change. Negative for chills, fatigue and fever.  HENT:  Negative for congestion, hearing loss, tinnitus, trouble swallowing and voice change.   Eyes:  Negative for visual disturbance.  Respiratory:  Negative for cough, chest tightness, shortness of breath and wheezing.   Cardiovascular:  Negative for chest pain, palpitations and leg swelling.  Gastrointestinal:  Positive for constipation (managed well with Linzess) and heartburn. Negative for abdominal pain, diarrhea and vomiting.  Endocrine: Negative for polydipsia and polyuria.  Genitourinary:  Negative for dysuria, frequency, genital sores, vaginal bleeding and vaginal discharge.  Musculoskeletal:  Negative for arthralgias, gait problem and joint swelling.  Skin:  Negative for color change and rash.  Neurological:  Negative for dizziness, tremors, light-headedness and headaches.  Hematological:  Negative for  adenopathy. Does not bruise/bleed easily.  Psychiatric/Behavioral:  Negative for dysphoric mood and sleep disturbance. The patient is nervous/anxious.     Patient Active Problem List   Diagnosis Date Noted   Generalized anxiety disorder 07/09/2021   Hepatic steatosis 02/20/2019   Mild hyperlipidemia 02/02/2019    Hypothyroidism due to acquired atrophy of thyroid 02/01/2019   Essential hypertension 05/21/2018   Eczema 10/20/2017   Class 2 obesity with body mass index (BMI) of 35 to 39.9 without comorbidity 04/19/2016   Gastroesophageal reflux disease without esophagitis 07/08/2015   PCOS (polycystic ovarian syndrome) 07/08/2015    Allergies  Allergen Reactions   Darvon [Propoxyphene] Other (See Comments)    Chest Pain.   Penicillins Other (See Comments)    Migraines.   Tramadol Hcl Rash   Other Other (See Comments)    Past Surgical History:  Procedure Laterality Date   c section  1996   September- SON Birth   CHOLECYSTECTOMY  2003   COLONOSCOPY WITH PROPOFOL N/A 02/11/2021   Procedure: COLONOSCOPY WITH PROPOFOL;  Surgeon: Lin Landsman, MD;  Location: [facility] ENDOSCOPY;  Service: Gastroenterology;  Laterality: N/A;   DILATION AND CURETTAGE OF UTERUS  1996 & 2006   ooph Right 1995   Right tube removed- mass was on tube.   TOTAL ABDOMINAL HYSTERECTOMY  2009   Right ovary attached to center of stomach.    Social History   Tobacco Use   Smoking status: Never   Smokeless tobacco: Never  Vaping Use   Vaping Use: Never used  Substance Use Topics   Alcohol use: No   Drug use: No     Medication list has been reviewed and updated.  Current Meds  Medication Sig   buPROPion ER (WELLBUTRIN SR) 100 MG 12 hr tablet Take 1 tablet (100 mg total) by mouth 2 (two) times daily.   docusate sodium (COLACE) 100 MG capsule Take 100 mg by mouth 2 (two) times daily.   levothyroxine (SYNTHROID) 112 MCG tablet Take 112 mcg by mouth daily.   lisinopril (ZESTRIL) 10 MG tablet TAKE 1 TABLET(10 MG) BY MOUTH DAILY   Misc Natural Products (ESTROVEN + ENERGY MAX STRENGTH PO) Take by mouth daily.   [DISCONTINUED] linaclotide (LINZESS) 145 MCG CAPS capsule Take 1 capsule (145 mcg total) by mouth daily before breakfast.   [DISCONTINUED] metFORMIN (GLUCOPHAGE) 500 MG tablet TAKE 1 TABLET(500 MG) BY MOUTH  DAILY WITH BREAKFAST   [DISCONTINUED] omeprazole (PRILOSEC) 20 MG capsule TAKE 1 CAPSULE(20 MG) BY MOUTH TWICE DAILY BEFORE A MEAL       12/07/2022    3:32 PM 08/17/2022    1:59 PM 06/10/2022    3:32 PM 03/11/2022    8:21 AM  GAD 7 : Generalized Anxiety Score  Nervous, Anxious, on Edge 2 2 2 1  $ Control/stop worrying 1 1 1 1  $ Worry too much - different things 1 2 1 1  $ Trouble relaxing 1 1 1 $ 0  Restless 1 1 1 $ 0  Easily annoyed or irritable 0 0 0 0  Afraid - awful might happen 2 2 2 1  $ Total GAD 7 Score 8 9 8 4  $ Anxiety Difficulty Not difficult at all Not difficult at all Not difficult at all        12/07/2022    3:32 PM 08/17/2022    1:58 PM 06/10/2022    3:32 PM  Depression screen PHQ 2/9  Decreased Interest 1 1 0  Down, Depressed, Hopeless 2 1 0  PHQ -  2 Score 3 2 0  Altered sleeping 1 2 2  $ Tired, decreased energy 1 1 1  $ Change in appetite 2 2 2  $ Feeling bad or failure about yourself  2 0 1  Trouble concentrating 0 0 0  Moving slowly or fidgety/restless 0 0 0  Suicidal thoughts 0 0 0  PHQ-9 Score 9 7 6  $ Difficult doing work/chores Not difficult at all Somewhat difficult Not difficult at all    BP Readings from Last 3 Encounters:  01/20/23 130/78  12/07/22 124/76  08/17/22 134/82    Physical Exam Vitals and nursing note reviewed.  Constitutional:      General: She is not in acute distress.    Appearance: She is well-developed.  HENT:     Head: Normocephalic and atraumatic.     Right Ear: Tympanic membrane and ear canal normal.     Left Ear: Tympanic membrane and ear canal normal.     Nose:     Right Sinus: No maxillary sinus tenderness.     Left Sinus: No maxillary sinus tenderness.  Eyes:     General: No scleral icterus.       Right eye: No discharge.        Left eye: No discharge.     Conjunctiva/sclera: Conjunctivae normal.  Neck:     Thyroid: No thyromegaly.     Vascular: No carotid bruit.  Cardiovascular:     Rate and Rhythm: Normal rate and regular  rhythm.     Pulses: Normal pulses.     Heart sounds: Normal heart sounds.  Pulmonary:     Effort: Pulmonary effort is normal. No respiratory distress.     Breath sounds: No wheezing.  Chest:  Breasts:    Right: No mass, nipple discharge, skin change or tenderness.     Left: No mass, nipple discharge, skin change or tenderness.  Abdominal:     General: Bowel sounds are normal.     Palpations: Abdomen is soft.     Tenderness: There is no abdominal tenderness.  Musculoskeletal:     Cervical back: Normal range of motion. No erythema.     Right lower leg: No edema.     Left lower leg: No edema.  Lymphadenopathy:     Cervical: No cervical adenopathy.  Skin:    General: Skin is warm and dry.     Findings: No rash.  Neurological:     Mental Status: She is alert and oriented to person, place, and time.     Cranial Nerves: No cranial nerve deficit.     Sensory: No sensory deficit.     Deep Tendon Reflexes: Reflexes are normal and symmetric.  Psychiatric:        Attention and Perception: Attention normal.        Mood and Affect: Mood normal.     Wt Readings from Last 3 Encounters:  01/20/23 233 lb (105.7 kg)  12/07/22 220 lb (99.8 kg)  08/17/22 203 lb (92.1 kg)    BP 130/78 (BP Location: Left Arm, Cuff Size: Large)   Pulse 88   Ht 5' 3"$  (1.6 m)   Wt 233 lb (105.7 kg)   LMP  (LMP Unknown)   SpO2 98%   BMI 41.27 kg/m   Assessment and Plan: Problem List Items Addressed This Visit       Cardiovascular and Mediastinum   Essential hypertension (Chronic)    Clinically stable exam with well controlled BP on lisinopril. Tolerating medications without side effects. Pt  to continue current regimen and low sodium diet.       Relevant Orders   CBC with Differential/Platelet   Comprehensive metabolic panel     Digestive   Gastroesophageal reflux disease without esophagitis (Chronic)    Symptoms well controlled on daily PPI No red flag signs such as weight loss, n/v,  melena Will continue omeprazole.       Relevant Medications   omeprazole (PRILOSEC) 20 MG capsule   linaclotide (LINZESS) 145 MCG CAPS capsule   Other Relevant Orders   CBC with Differential/Platelet     Endocrine   Hypothyroidism due to acquired atrophy of thyroid (Chronic)    supplemented      Relevant Medications   levothyroxine (SYNTHROID) 112 MCG tablet   Other Relevant Orders   TSH + free T4   PCOS (polycystic ovarian syndrome) (Chronic)    On metformin for metabolic syndrome      Relevant Medications   metFORMIN (GLUCOPHAGE) 500 MG tablet   Other Relevant Orders   Hemoglobin A1c     Other   Class 2 obesity with body mass index (BMI) of 35 to 39.9 without comorbidity    She is interested in GLP-1 for weight loss but not covered Considering going to a bariatric clinic to get compounded Ozempic. Will try Bupropion to also help reduce anxiety while she is considering other options      Relevant Medications   buPROPion ER (WELLBUTRIN SR) 100 MG 12 hr tablet   metFORMIN (GLUCOPHAGE) 500 MG tablet   Mild hyperlipidemia (Chronic)   Relevant Orders   Lipid panel   Other Visit Diagnoses     Annual physical exam    -  Primary   Relevant Orders   CBC with Differential/Platelet   Comprehensive metabolic panel   Hemoglobin A1c   Lipid panel   TSH + free T4   Encounter for screening mammogram for breast cancer       Relevant Orders   MM 3D SCREEN BREAST BILATERAL   Carpal tunnel syndrome of right wrist       recommend wrist splint while sleeping   Relevant Medications   buPROPion ER (WELLBUTRIN SR) 100 MG 12 hr tablet   Slow transit constipation       Relevant Medications   linaclotide (LINZESS) 145 MCG CAPS capsule        Partially dictated using Editor, commissioning. Any errors are unintentional.  Halina Maidens, MD [facility]  01/20/2023

## 2023-01-20 NOTE — Assessment & Plan Note (Signed)
Clinically stable exam with well controlled BP on lisinopril. Tolerating medications without side effects. Pt to continue current regimen and low sodium diet.

## 2023-01-20 NOTE — Assessment & Plan Note (Signed)
supplemented

## 2023-01-20 NOTE — Assessment & Plan Note (Signed)
>>  ASSESSMENT AND PLAN FOR CLASS 2 OBESITY WITH BODY MASS INDEX (BMI) OF 35 TO 39.9 WITHOUT COMORBIDITY WRITTEN ON 01/20/2023  3:55 PM BY BERGLUND, LEITA DEL, MD  She is interested in GLP-1 for weight loss but not covered Considering going to a bariatric clinic to get compounded Ozempic. Will try Bupropion  to also help reduce anxiety while she is considering other options

## 2023-01-20 NOTE — Assessment & Plan Note (Signed)
Symptoms well controlled on daily PPI No red flag signs such as weight loss, n/v, melena Will continue omeprazole.

## 2023-01-20 NOTE — Assessment & Plan Note (Addendum)
She is interested in GLP-1 for weight loss but not covered Considering going to a bariatric clinic to get compounded Ozempic. Will try Bupropion to also help reduce anxiety while she is considering other options

## 2023-01-21 ENCOUNTER — Encounter (INDEPENDENT_AMBULATORY_CARE_PROVIDER_SITE_OTHER): Payer: Self-pay

## 2023-01-21 LAB — CBC WITH DIFFERENTIAL/PLATELET
Basophils Absolute: 0.1 10*3/uL (ref 0.0–0.2)
Basos: 1 %
EOS (ABSOLUTE): 0.2 10*3/uL (ref 0.0–0.4)
Eos: 2 %
Hematocrit: 40.8 % (ref 34.0–46.6)
Hemoglobin: 13.9 g/dL (ref 11.1–15.9)
Immature Grans (Abs): 0 10*3/uL (ref 0.0–0.1)
Immature Granulocytes: 0 %
Lymphocytes Absolute: 2.7 10*3/uL (ref 0.7–3.1)
Lymphs: 30 %
MCH: 29 pg (ref 26.6–33.0)
MCHC: 34.1 g/dL (ref 31.5–35.7)
MCV: 85 fL (ref 79–97)
Monocytes Absolute: 0.6 10*3/uL (ref 0.1–0.9)
Monocytes: 7 %
Neutrophils Absolute: 5.4 10*3/uL (ref 1.4–7.0)
Neutrophils: 60 %
Platelets: 278 10*3/uL (ref 150–450)
RBC: 4.79 x10E6/uL (ref 3.77–5.28)
RDW: 12.9 % (ref 11.7–15.4)
WBC: 8.9 10*3/uL (ref 3.4–10.8)

## 2023-01-21 LAB — COMPREHENSIVE METABOLIC PANEL
ALT: 26 IU/L (ref 0–32)
AST: 25 IU/L (ref 0–40)
Albumin/Globulin Ratio: 2 (ref 1.2–2.2)
Albumin: 4.6 g/dL (ref 3.8–4.9)
Alkaline Phosphatase: 113 IU/L (ref 44–121)
BUN/Creatinine Ratio: 25 — ABNORMAL HIGH (ref 9–23)
BUN: 21 mg/dL (ref 6–24)
Bilirubin Total: 0.4 mg/dL (ref 0.0–1.2)
CO2: 24 mmol/L (ref 20–29)
Calcium: 9.7 mg/dL (ref 8.7–10.2)
Chloride: 102 mmol/L (ref 96–106)
Creatinine, Ser: 0.85 mg/dL (ref 0.57–1.00)
Globulin, Total: 2.3 g/dL (ref 1.5–4.5)
Glucose: 94 mg/dL (ref 70–99)
Potassium: 4.1 mmol/L (ref 3.5–5.2)
Sodium: 142 mmol/L (ref 134–144)
Total Protein: 6.9 g/dL (ref 6.0–8.5)
eGFR: 82 mL/min/{1.73_m2} (ref >59)

## 2023-01-21 LAB — HEMOGLOBIN A1C
Est. average glucose Bld gHb Est-mCnc: 103 mg/dL
Hgb A1c MFr Bld: 5.2 % (ref 4.8–5.6)

## 2023-01-21 LAB — LIPID PANEL
Chol/HDL Ratio: 4.8 ratio — ABNORMAL HIGH (ref 0.0–4.4)
Cholesterol, Total: 237 mg/dL — ABNORMAL HIGH (ref 100–199)
HDL: 49 mg/dL (ref >39)
LDL Chol Calc (NIH): 138 mg/dL — ABNORMAL HIGH (ref 0–99)
Triglycerides: 278 mg/dL — ABNORMAL HIGH (ref 0–149)
VLDL Cholesterol Cal: 50 mg/dL — ABNORMAL HIGH (ref 5–40)

## 2023-01-21 LAB — TSH+FREE T4
Free T4: 1.58 ng/dL (ref 0.82–1.77)
TSH: 1.91 u[IU]/mL (ref 0.450–4.500)

## 2023-01-27 ENCOUNTER — Encounter: Payer: Self-pay | Admitting: Internal Medicine

## 2023-01-27 NOTE — Telephone Encounter (Signed)
Please review.  KP

## 2023-01-31 ENCOUNTER — Encounter: Payer: Self-pay | Admitting: Internal Medicine

## 2023-02-10 ENCOUNTER — Other Ambulatory Visit: Payer: Self-pay | Admitting: Internal Medicine

## 2023-02-10 DIAGNOSIS — I10 Essential (primary) hypertension: Secondary | ICD-10-CM

## 2023-02-14 ENCOUNTER — Encounter: Payer: Self-pay | Admitting: Internal Medicine

## 2023-02-14 NOTE — Telephone Encounter (Signed)
Please review.  KP

## 2023-02-15 ENCOUNTER — Other Ambulatory Visit: Payer: Self-pay | Admitting: Internal Medicine

## 2023-02-15 DIAGNOSIS — E669 Obesity, unspecified: Secondary | ICD-10-CM

## 2023-02-15 NOTE — Telephone Encounter (Signed)
Requested Prescriptions  Pending Prescriptions Disp Refills   buPROPion ER (WELLBUTRIN SR) 100 MG 12 hr tablet [Pharmacy Med Name: BUPROPION SR '100MG'$  TABLETS (12 H)] 180 tablet 0    Sig: TAKE 1 TABLET(100 MG) BY MOUTH TWICE DAILY     Psychiatry: Antidepressants - bupropion Passed - 02/15/2023  7:08 AM      Passed - Cr in normal range and within 360 days    Creatinine, Ser  Date Value Ref Range Status  01/20/2023 0.85 0.57 - 1.00 mg/dL Final         Passed - AST in normal range and within 360 days    AST  Date Value Ref Range Status  01/20/2023 25 0 - 40 IU/L Final         Passed - ALT in normal range and within 360 days    ALT  Date Value Ref Range Status  01/20/2023 26 0 - 32 IU/L Final         Passed - Last BP in normal range    BP Readings from Last 1 Encounters:  01/20/23 130/78         Passed - Valid encounter within last 6 months    Recent Outpatient Visits           3 weeks ago Annual physical exam   [place] at [facility], Jesse Sans, MD   2 months ago Eustachian tube dysfunction, left   [facility], Jesse Sans, MD   6 months ago Hearing loss due to cerumen impaction, right   [place] at [facility], Jesse Sans, MD   8 months ago Essential hypertension   [place] [facility], Jesse Sans, MD   11 months ago Essential hypertension   [place] at [facility], Jesse Sans, MD       Future Appointments             In 5 months Army Melia, Jesse Sans, MD [place] at [facility]

## 2023-02-23 NOTE — Telephone Encounter (Signed)
Pt response.  KP

## 2023-02-26 ENCOUNTER — Telehealth: Payer: BC Managed Care – PPO | Admitting: Physician Assistant

## 2023-02-26 DIAGNOSIS — M5431 Sciatica, right side: Secondary | ICD-10-CM | POA: Diagnosis not present

## 2023-02-26 MED ORDER — CYCLOBENZAPRINE HCL 10 MG PO TABS: 5.0000 mg | ORAL_TABLET | Freq: Three times a day (TID) | ORAL | 0 refills | Status: DC | PRN

## 2023-02-26 MED ORDER — NAPROXEN 500 MG PO TABS: 500.0000 mg | ORAL_TABLET | Freq: Two times a day (BID) | ORAL | 0 refills | Status: DC

## 2023-02-26 NOTE — Progress Notes (Signed)

## 2023-03-01 ENCOUNTER — Encounter: Payer: Self-pay | Admitting: Internal Medicine

## 2023-03-01 NOTE — Telephone Encounter (Signed)
Please review.  KP

## 2023-03-13 ENCOUNTER — Encounter: Payer: Self-pay | Admitting: Internal Medicine

## 2023-03-13 ENCOUNTER — Other Ambulatory Visit: Payer: Self-pay | Admitting: Internal Medicine

## 2023-03-13 DIAGNOSIS — K5901 Slow transit constipation: Secondary | ICD-10-CM

## 2023-03-13 DIAGNOSIS — F411 Generalized anxiety disorder: Secondary | ICD-10-CM

## 2023-03-13 MED ORDER — BUSPIRONE HCL 10 MG PO TABS
10.0000 mg | ORAL_TABLET | Freq: Two times a day (BID) | ORAL | 1 refills | Status: DC
Start: 2023-03-13 — End: 2023-04-25

## 2023-03-13 NOTE — Telephone Encounter (Signed)
Please review.  KP

## 2023-03-22 ENCOUNTER — Encounter: Payer: Self-pay | Admitting: Internal Medicine

## 2023-03-22 ENCOUNTER — Other Ambulatory Visit: Payer: Self-pay | Admitting: Internal Medicine

## 2023-03-22 DIAGNOSIS — K5901 Slow transit constipation: Secondary | ICD-10-CM

## 2023-03-22 MED ORDER — LINACLOTIDE 290 MCG PO CAPS
290.0000 ug | ORAL_CAPSULE | Freq: Every day | ORAL | 1 refills | Status: DC
Start: 2023-03-22 — End: 2023-09-20

## 2023-04-11 ENCOUNTER — Other Ambulatory Visit: Payer: Self-pay | Admitting: Internal Medicine

## 2023-04-11 ENCOUNTER — Encounter: Payer: Self-pay | Admitting: Internal Medicine

## 2023-04-11 DIAGNOSIS — F411 Generalized anxiety disorder: Secondary | ICD-10-CM

## 2023-04-11 MED ORDER — HYDROXYZINE PAMOATE 25 MG PO CAPS
25.0000 mg | ORAL_CAPSULE | Freq: Every day | ORAL | 0 refills | Status: DC | PRN
Start: 2023-04-11 — End: 2023-04-25

## 2023-04-11 NOTE — Telephone Encounter (Signed)
Pt response.  KP

## 2023-04-25 ENCOUNTER — Encounter: Payer: Self-pay | Admitting: Internal Medicine

## 2023-04-25 ENCOUNTER — Ambulatory Visit (INDEPENDENT_AMBULATORY_CARE_PROVIDER_SITE_OTHER): Payer: BC Managed Care – PPO | Admitting: Internal Medicine

## 2023-04-25 VITALS — BP 128/78 | HR 77 | Ht 63.0 in | Wt 222.0 lb

## 2023-04-25 DIAGNOSIS — F411 Generalized anxiety disorder: Secondary | ICD-10-CM

## 2023-04-25 DIAGNOSIS — E669 Obesity, unspecified: Secondary | ICD-10-CM

## 2023-04-25 MED ORDER — CLONAZEPAM 0.5 MG PO TABS: 0.5000 mg | ORAL_TABLET | Freq: Two times a day (BID) | ORAL | 0 refills | Status: DC | PRN

## 2023-04-25 NOTE — Assessment & Plan Note (Addendum)
No benefit from Buspar and intolerant to Bupropion. Hydroxyzine caused dizziness/nausea. Will give clonazepam to use PRN #30 for 90 days.

## 2023-04-25 NOTE — Assessment & Plan Note (Signed)
Doing well on Semaglutide from on line MD Continue current dose, exercise and portion control

## 2023-04-25 NOTE — Assessment & Plan Note (Signed)
>>  ASSESSMENT AND PLAN FOR CLASS 2 OBESITY WITH BODY MASS INDEX (BMI) OF 35 TO 39.9 WITHOUT COMORBIDITY WRITTEN ON 04/25/2023  2:23 PM BY BERGLUND, LEITA DEL, MD  Doing well on Semaglutide from on line MD Continue current dose, exercise and portion control

## 2023-04-25 NOTE — Progress Notes (Signed)
Date:  04/25/2023   Name:  Cindy Pena   DOB:  1971-07-17   MRN:  [id number]   Chief Complaint: Anxiety (Patient no longer taking buspirone - it did not help. )  Anxiety Presents for follow-up visit. Symptoms include nervous/anxious behavior. Patient reports no chest pain, dizziness, palpitations or shortness of breath. Episode frequency: about 1-2 times per week. The quality of sleep is good.    Obesity - taking compounded Semaglutide from a clinic in [place]. [employer] online.  She has lost 11 lbs so far.  Just started the 0.5 mg dose.  Lab Results  Component Value Date   NA 142 01/20/2023   K 4.1 01/20/2023   CO2 24 01/20/2023   GLUCOSE 94 01/20/2023   BUN 21 01/20/2023   CREATININE 0.85 01/20/2023   CALCIUM 9.7 01/20/2023   EGFR 82 01/20/2023   GFRNONAA 75 02/07/2020   Lab Results  Component Value Date   CHOL 237 (H) 01/20/2023   HDL 49 01/20/2023   LDLCALC 138 (H) 01/20/2023   TRIG 278 (H) 01/20/2023   CHOLHDL 4.8 (H) 01/20/2023   Lab Results  Component Value Date   TSH 1.910 01/20/2023   Lab Results  Component Value Date   HGBA1C 5.2 01/20/2023   Lab Results  Component Value Date   WBC 8.9 01/20/2023   HGB 13.9 01/20/2023   HCT 40.8 01/20/2023   MCV 85 01/20/2023   PLT 278 01/20/2023   Lab Results  Component Value Date   ALT 26 01/20/2023   AST 25 01/20/2023   ALKPHOS 113 01/20/2023   BILITOT 0.4 01/20/2023   Lab Results  Component Value Date   VD25OH 23.1 (L) 06/30/2021     Review of Systems  Constitutional:  Negative for chills, fatigue and fever.  Respiratory:  Negative for chest tightness and shortness of breath.   Cardiovascular:  Negative for chest pain and palpitations.  Neurological:  Negative for dizziness, light-headedness and headaches.  Psychiatric/Behavioral:  Negative for dysphoric mood and sleep disturbance. The patient is nervous/anxious.     Patient Active Problem List   Diagnosis Date Noted   Generalized  anxiety disorder 07/09/2021   Hepatic steatosis 02/20/2019   Mild hyperlipidemia 02/02/2019   Hypothyroidism due to acquired atrophy of thyroid 02/01/2019   Essential hypertension 05/21/2018   Eczema 10/20/2017   Class 2 obesity with body mass index (BMI) of 35 to 39.9 without comorbidity 04/19/2016   Gastroesophageal reflux disease without esophagitis 07/08/2015   PCOS (polycystic ovarian syndrome) 07/08/2015    Allergies  Allergen Reactions   Darvon [Propoxyphene] Other (See Comments)    Chest Pain.   Penicillins Other (See Comments)    Migraines.   Tramadol Hcl Rash   Other Other (See Comments)    Past Surgical History:  Procedure Laterality Date   c section  1996   September- SON Birth   CHOLECYSTECTOMY  2003   COLONOSCOPY WITH PROPOFOL N/A 02/11/2021   Procedure: COLONOSCOPY WITH PROPOFOL;  Surgeon: Toney Reil, MD;  Location: [facility] ENDOSCOPY;  Service: Gastroenterology;  Laterality: N/A;   DILATION AND CURETTAGE OF UTERUS  1996 & 2006   ooph Right 1995   Right tube removed- mass was on tube.   TOTAL ABDOMINAL HYSTERECTOMY  2009   Right ovary attached to center of stomach.    Social History   Tobacco Use   Smoking status: Never   Smokeless tobacco: Never  Vaping Use   Vaping Use: Never used  Substance  Use Topics   Alcohol use: No   Drug use: No     Medication list has been reviewed and updated.  Current Meds  Medication Sig   clonazePAM (KLONOPIN) 0.5 MG tablet Take 1 tablet (0.5 mg total) by mouth 2 (two) times daily as needed for anxiety.   cyclobenzaprine (FLEXERIL) 10 MG tablet Take 0.5-1 tablets (5-10 mg total) by mouth 3 (three) times daily as needed.   docusate sodium (COLACE) 100 MG capsule Take 100 mg by mouth 2 (two) times daily.   levothyroxine (SYNTHROID) 112 MCG tablet TAKE 1 TABLET(112 MCG) BY MOUTH DAILY BEFORE BREAKFAST   linaclotide (LINZESS) 290 MCG CAPS capsule Take 1 capsule (290 mcg total) by mouth daily before breakfast.    lisinopril (ZESTRIL) 10 MG tablet TAKE 1 TABLET(10 MG) BY MOUTH DAILY   metFORMIN (GLUCOPHAGE) 500 MG tablet TAKE 1 TABLET(500 MG) BY MOUTH DAILY WITH BREAKFAST   Misc Natural Products (ESTROVEN + ENERGY MAX STRENGTH PO) Take by mouth daily.   naproxen (NAPROSYN) 500 MG tablet Take 1 tablet (500 mg total) by mouth 2 (two) times daily with a meal.   omeprazole (PRILOSEC) 20 MG capsule TAKE 1 CAPSULE daily   Semaglutide-Weight Management 0.5 MG/0.5ML SOAJ Inject 0.5 mg into the skin.   [DISCONTINUED] hydrOXYzine (VISTARIL) 25 MG capsule Take 1 capsule (25 mg total) by mouth daily as needed for anxiety.       04/25/2023    2:04 PM 12/07/2022    3:32 PM 08/17/2022    1:59 PM 06/10/2022    3:32 PM  GAD 7 : Generalized Anxiety Score  Nervous, Anxious, on Edge 3 2 2 2   Control/stop worrying 2 1 1 1   Worry too much - different things 0 1 2 1   Trouble relaxing 1 1 1 1   Restless 1 1 1 1   Easily annoyed or irritable 0 0 0 0  Afraid - awful might happen 2 2 2 2   Total GAD 7 Score 9 8 9 8   Anxiety Difficulty Not difficult at all Not difficult at all Not difficult at all Not difficult at all       04/25/2023    2:04 PM 12/07/2022    3:32 PM 08/17/2022    1:58 PM  Depression screen PHQ 2/9  Decreased Interest 1 1 1   Down, Depressed, Hopeless 0 2 1  PHQ - 2 Score 1 3 2   Altered sleeping 2 1 2   Tired, decreased energy 2 1 1   Change in appetite 0 2 2  Feeling bad or failure about yourself  0 2 0  Trouble concentrating 0 0 0  Moving slowly or fidgety/restless 0 0 0  Suicidal thoughts 0 0 0  PHQ-9 Score 5 9 7   Difficult doing work/chores Not difficult at all Not difficult at all Somewhat difficult    BP Readings from Last 3 Encounters:  04/25/23 128/78  01/20/23 130/78  12/07/22 124/76    Physical Exam Vitals and nursing note reviewed.  Constitutional:      General: She is not in acute distress.    Appearance: She is well-developed.  HENT:     Head: Normocephalic and atraumatic.   Cardiovascular:     Rate and Rhythm: Normal rate and regular rhythm.  Pulmonary:     Effort: Pulmonary effort is normal. No respiratory distress.     Breath sounds: No wheezing or rhonchi.  Musculoskeletal:     Cervical back: Normal range of motion.     Right lower leg:  No edema.     Left lower leg: No edema.  Lymphadenopathy:     Cervical: No cervical adenopathy.  Skin:    General: Skin is warm and dry.     Findings: No rash.  Neurological:     Mental Status: She is alert and oriented to person, place, and time.  Psychiatric:        Mood and Affect: Mood normal.        Behavior: Behavior normal.     Wt Readings from Last 3 Encounters:  04/25/23 222 lb (100.7 kg)  01/20/23 233 lb (105.7 kg)  12/07/22 220 lb (99.8 kg)    BP 128/78   Pulse 77   Ht 5\' 3"  (1.6 m)   Wt 222 lb (100.7 kg)   LMP  (LMP Unknown)   SpO2 100%   BMI 39.33 kg/m   Assessment and Plan:  Problem List Items Addressed This Visit     Class 2 obesity with body mass index (BMI) of 35 to 39.9 without comorbidity    Doing well on Semaglutide from on line MD Continue current dose, exercise and portion control      Relevant Medications   Semaglutide-Weight Management 0.5 MG/0.5ML SOAJ   Generalized anxiety disorder - Primary    No benefit from Buspar and intolerant to Bupropion. Hydroxyzine caused dizziness/nausea. Will give clonazepam to use PRN #30 for 90 days.        No follow-ups on file.   Partially dictated using Dragon software, any errors are not intentional.  [name], MD [facility] [place], [place]

## 2023-05-12 ENCOUNTER — Encounter: Payer: Self-pay | Admitting: Internal Medicine

## 2023-05-12 ENCOUNTER — Other Ambulatory Visit: Payer: Self-pay

## 2023-05-12 DIAGNOSIS — R42 Dizziness and giddiness: Secondary | ICD-10-CM

## 2023-05-12 MED ORDER — ONDANSETRON 4 MG PO TBDP
4.0000 mg | ORAL_TABLET | Freq: Three times a day (TID) | ORAL | 0 refills | Status: DC | PRN
Start: 2023-05-12 — End: 2023-07-25

## 2023-05-12 MED ORDER — MECLIZINE HCL 25 MG PO TABS
12.5000 mg | ORAL_TABLET | Freq: Three times a day (TID) | ORAL | 0 refills | Status: DC | PRN
Start: 2023-05-12 — End: 2023-07-25

## 2023-05-21 ENCOUNTER — Encounter: Payer: Self-pay | Admitting: Internal Medicine

## 2023-06-26 ENCOUNTER — Encounter: Payer: Self-pay | Admitting: Internal Medicine

## 2023-06-26 NOTE — Telephone Encounter (Signed)
Please review.  KP

## 2023-07-06 ENCOUNTER — Other Ambulatory Visit: Payer: Self-pay | Admitting: Internal Medicine

## 2023-07-06 DIAGNOSIS — E282 Polycystic ovarian syndrome: Secondary | ICD-10-CM

## 2023-07-06 DIAGNOSIS — K219 Gastro-esophageal reflux disease without esophagitis: Secondary | ICD-10-CM

## 2023-07-06 NOTE — Telephone Encounter (Signed)
Requested Prescriptions  Pending Prescriptions Disp Refills   omeprazole (PRILOSEC) 20 MG capsule [Pharmacy Med Name: OMEPRAZOLE 20MG  CAPSULES] 90 capsule 1    Sig: TAKE 1 CAPSULE BY MOUTH DAILY     Gastroenterology: Proton Pump Inhibitors Passed - 07/06/2023  7:06 AM      Passed - Valid encounter within last 12 months    Recent Outpatient Visits           2 months ago Generalized anxiety disorder   [place] [facility] at [facility], Nyoka Cowden, MD   5 months ago Annual physical exam   [facility] at [facility], Nyoka Cowden, MD   7 months ago Eustachian tube dysfunction, left   [place] [facility] at [facility], Nyoka Cowden, MD   10 months ago Hearing loss due to cerumen impaction, right   [facility] at [facility], Nyoka Cowden, MD   1 year ago Essential hypertension   [place] [facility] at [facility], Nyoka Cowden, MD       Future Appointments             In 2 weeks Judithann Graves Nyoka Cowden, MD [facility] at [facility], [facility]             metFORMIN (GLUCOPHAGE) 500 MG tablet [Pharmacy Med Name: METFORMIN 500MG  TABLETS] 90 tablet 1    Sig: TAKE 1 TABLET(500 MG) BY MOUTH DAILY WITH BREAKFAST     Endocrinology:  Diabetes - Biguanides Failed - 07/06/2023  7:06 AM      Failed - B12 Level in normal range and within 720 days    No results found for: "VITAMINB12"       Passed - Cr in normal range and within 360 days    Creatinine, Ser  Date Value Ref Range Status  01/20/2023 0.85 0.57 - 1.00 mg/dL Final         Passed - HBA1C is between 0 and 7.9 and within 180 days    Hgb A1c MFr Bld  Date Value Ref Range Status  01/20/2023 5.2 4.8 - 5.6 % Final    Comment:             Prediabetes: 5.7 - 6.4          Diabetes: >6.4          Glycemic control for adults with  diabetes: <7.0          Passed - eGFR in normal range and within 360 days    GFR calc Af Amer  Date Value Ref Range Status  02/07/2020 87 >59 mL/min/1.73 Final   GFR calc non Af Amer  Date Value Ref Range Status  02/07/2020 75 >59 mL/min/1.73 Final   eGFR  Date Value Ref Range Status  01/20/2023 82 >59 mL/min/1.73 Final         Passed - Valid encounter within last 6 months    Recent Outpatient Visits           2 months ago Generalized anxiety disorder   [place] [facility] at [facility], Nyoka Cowden, MD   5 months ago Annual physical exam   [facility] at [facility], Nyoka Cowden, MD   7 months ago Eustachian tube dysfunction, left   [facility] at [facility], Nyoka Cowden, MD   10 months ago Hearing loss due to cerumen impaction, right   [facility] at [facility], Nyoka Cowden, MD   1 year ago Essential hypertension   [place] [facility] at [facility], Nyoka Cowden, MD       Future Appointments             In 2 weeks [name], MD [facility] at [facility]            Passed - CBC within normal limits and completed in the last 12 months    WBC  Date Value Ref Range Status  01/20/2023 8.9 3.4 - 10.8 x10E3/uL Final  12/26/2019 7.0 4.0 - 10.5 K/uL Final   RBC  Date Value Ref Range Status  01/20/2023 4.79 3.77 - 5.28 x10E6/uL Final  12/26/2019 5.37 (H) 3.87 - 5.11 MIL/uL Final   Hemoglobin  Date Value Ref Range Status  01/20/2023 13.9 11.1 - 15.9 g/dL Final   Hematocrit  Date Value Ref Range Status  01/20/2023 40.8 34.0 - 46.6 % Final   MCHC  Date Value Ref Range Status  01/20/2023 34.1 31.5 - 35.7 g/dL Final  96/29/5284 13.2 30.0 - 36.0 g/dL Final   [facility]  Date Value Ref Range Status  01/20/2023 29.0 26.6 - 33.0 pg  Final  12/26/2019 27.2 26.0 - 34.0 pg Final   MCV  Date Value Ref Range Status  01/20/2023 85 79 - 97 fL Final   No results found for: "PLTCOUNTKUC", "LABPLAT", "POCPLA" RDW  Date Value Ref Range Status  01/20/2023 12.9 11.7 - 15.4 % Final

## 2023-07-13 ENCOUNTER — Encounter: Payer: Self-pay | Admitting: Internal Medicine

## 2023-07-14 ENCOUNTER — Other Ambulatory Visit: Payer: Self-pay

## 2023-07-14 DIAGNOSIS — E282 Polycystic ovarian syndrome: Secondary | ICD-10-CM

## 2023-07-14 DIAGNOSIS — K219 Gastro-esophageal reflux disease without esophagitis: Secondary | ICD-10-CM

## 2023-07-14 MED ORDER — METFORMIN HCL 500 MG PO TABS
ORAL_TABLET | ORAL | 0 refills | Status: DC
Start: 2023-07-14 — End: 2023-10-10

## 2023-07-14 MED ORDER — OMEPRAZOLE 20 MG PO CPDR
DELAYED_RELEASE_CAPSULE | ORAL | 0 refills | Status: DC
Start: 2023-07-14 — End: 2023-10-10

## 2023-07-25 ENCOUNTER — Encounter: Payer: Self-pay | Admitting: Internal Medicine

## 2023-07-25 ENCOUNTER — Ambulatory Visit (INDEPENDENT_AMBULATORY_CARE_PROVIDER_SITE_OTHER): Payer: BC Managed Care – PPO | Admitting: Internal Medicine

## 2023-07-25 VITALS — BP 128/74 | HR 79 | Ht 63.0 in | Wt 219.0 lb

## 2023-07-25 DIAGNOSIS — E034 Atrophy of thyroid (acquired): Secondary | ICD-10-CM | POA: Diagnosis not present

## 2023-07-25 DIAGNOSIS — I10 Essential (primary) hypertension: Secondary | ICD-10-CM

## 2023-07-25 DIAGNOSIS — F411 Generalized anxiety disorder: Secondary | ICD-10-CM

## 2023-07-25 DIAGNOSIS — E669 Obesity, unspecified: Secondary | ICD-10-CM | POA: Diagnosis not present

## 2023-07-25 DIAGNOSIS — E66812 Obesity, class 2: Secondary | ICD-10-CM

## 2023-07-25 MED ORDER — CLONAZEPAM 0.5 MG PO TABS
0.5000 mg | ORAL_TABLET | Freq: Two times a day (BID) | ORAL | 1 refills | Status: DC | PRN
Start: 2023-07-25 — End: 2024-02-01

## 2023-07-25 NOTE — Assessment & Plan Note (Signed)
Currently using clonazepam PRN only and doing fairly well

## 2023-07-25 NOTE — Progress Notes (Signed)
Date:  07/25/2023   Name:  Cindy Pena   DOB:  1971/06/26   MRN:  [id number]   Chief Complaint: Hypertension  Hypertension This is a chronic problem. The problem is controlled. Associated symptoms include anxiety. Pertinent negatives include no chest pain, headaches, palpitations or shortness of breath. Past treatments include ACE inhibitors. The current treatment provides significant improvement. Identifiable causes of hypertension include a thyroid problem.  Thyroid Problem Presents for follow-up visit. Symptoms include anxiety. Patient reports no constipation, diarrhea, fatigue or palpitations. The symptoms have been stable.  Anxiety Presents for follow-up visit. Symptoms include nervous/anxious behavior. Patient reports no chest pain, dizziness, palpitations or shortness of breath. Symptoms occur occasionally (several times per week takes Clonazepam).    Obesity - on Naltrexone 6 mg daily which has helped with the cravings and "brain chatter" w/r to food.  She continues to lose weight slowly.  She still has some Semaglutide and may try a very low dose to see if it is tolerated.  Lab Results  Component Value Date   NA 142 01/20/2023   K 4.1 01/20/2023   CO2 24 01/20/2023   GLUCOSE 94 01/20/2023   BUN 21 01/20/2023   CREATININE 0.85 01/20/2023   CALCIUM 9.7 01/20/2023   EGFR 82 01/20/2023   GFRNONAA 75 02/07/2020   Lab Results  Component Value Date   CHOL 237 (H) 01/20/2023   HDL 49 01/20/2023   LDLCALC 138 (H) 01/20/2023   TRIG 278 (H) 01/20/2023   CHOLHDL 4.8 (H) 01/20/2023   Lab Results  Component Value Date   TSH 1.910 01/20/2023   Lab Results  Component Value Date   HGBA1C 5.2 01/20/2023   Lab Results  Component Value Date   WBC 8.9 01/20/2023   HGB 13.9 01/20/2023   HCT 40.8 01/20/2023   MCV 85 01/20/2023   PLT 278 01/20/2023   Lab Results  Component Value Date   ALT 26 01/20/2023   AST 25 01/20/2023   ALKPHOS 113 01/20/2023   BILITOT  0.4 01/20/2023   Lab Results  Component Value Date   VD25OH 23.1 (L) 06/30/2021     Review of Systems  Constitutional:  Negative for chills, fatigue and unexpected weight change.  HENT:  Negative for nosebleeds.   Eyes:  Negative for visual disturbance.  Respiratory:  Negative for cough, chest tightness, shortness of breath and wheezing.   Cardiovascular:  Negative for chest pain, palpitations and leg swelling.  Gastrointestinal:  Negative for abdominal pain, constipation and diarrhea.  Neurological:  Negative for dizziness, weakness, light-headedness and headaches.  Psychiatric/Behavioral:  Negative for dysphoric mood and sleep disturbance. The patient is nervous/anxious.     Patient Active Problem List   Diagnosis Date Noted   Generalized anxiety disorder 07/09/2021   Hepatic steatosis 02/20/2019   Mild hyperlipidemia 02/02/2019   Hypothyroidism due to acquired atrophy of thyroid 02/01/2019   Essential hypertension 05/21/2018   Eczema 10/20/2017   Class 2 obesity with body mass index (BMI) of 35 to 39.9 without comorbidity 04/19/2016   Gastroesophageal reflux disease without esophagitis 07/08/2015   PCOS (polycystic ovarian syndrome) 07/08/2015    Allergies  Allergen Reactions   Darvon [Propoxyphene] Other (See Comments)    Chest Pain.   Penicillins Other (See Comments)    Migraines.   Tramadol Hcl Rash   Other Other (See Comments)    Past Surgical History:  Procedure Laterality Date   c section  1996   September- SON Birth   CHOLECYSTECTOMY  2003   COLONOSCOPY WITH PROPOFOL N/A 02/11/2021   Procedure: COLONOSCOPY WITH PROPOFOL;  Surgeon: Toney Reil, MD;  Location: [facility] ENDOSCOPY;  Service: Gastroenterology;  Laterality: N/A;   DILATION AND CURETTAGE OF UTERUS  1996 & 2006   ooph Right 1995   Right tube removed- mass was on tube.   TOTAL ABDOMINAL HYSTERECTOMY  2009   Right ovary attached to center of stomach.    Social History   Tobacco Use    Smoking status: Never   Smokeless tobacco: Never  Vaping Use   Vaping status: Never Used  Substance Use Topics   Alcohol use: No   Drug use: No     Medication list has been reviewed and updated.  Current Meds  Medication Sig   docusate sodium (COLACE) 100 MG capsule Take 100 mg by mouth 2 (two) times daily.   levothyroxine (SYNTHROID) 112 MCG tablet TAKE 1 TABLET(112 MCG) BY MOUTH DAILY BEFORE BREAKFAST   linaclotide (LINZESS) 290 MCG CAPS capsule Take 1 capsule (290 mcg total) by mouth daily before breakfast.   lisinopril (ZESTRIL) 10 MG tablet TAKE 1 TABLET(10 MG) BY MOUTH DAILY   metFORMIN (GLUCOPHAGE) 500 MG tablet TAKE 1 TABLET(500 MG) BY MOUTH DAILY WITH BREAKFAST   Misc Natural Products (ESTROVEN + ENERGY MAX STRENGTH PO) Take by mouth daily.   naproxen (NAPROSYN) 500 MG tablet Take 1 tablet (500 mg total) by mouth 2 (two) times daily with a meal.   omeprazole (PRILOSEC) 20 MG capsule TAKE 1 CAPSULE BY MOUTH DAILY       07/25/2023    3:33 PM 04/25/2023    2:04 PM 12/07/2022    3:32 PM 08/17/2022    1:59 PM  GAD 7 : Generalized Anxiety Score  Nervous, Anxious, on Edge 2 3 2 2   Control/stop worrying 1 2 1 1   Worry too much - different things 1 0 1 2  Trouble relaxing 0 1 1 1   Restless 0 1 1 1   Easily annoyed or irritable 0 0 0 0  Afraid - awful might happen 1 2 2 2   Total GAD 7 Score 5 9 8 9   Anxiety Difficulty Not difficult at all Not difficult at all Not difficult at all Not difficult at all       07/25/2023    3:33 PM 04/25/2023    2:04 PM 12/07/2022    3:32 PM  Depression screen PHQ 2/9  Decreased Interest 0 1 1  Down, Depressed, Hopeless 1 0 2  PHQ - 2 Score 1 1 3   Altered sleeping 3 2 1   Tired, decreased energy 1 2 1   Change in appetite 1 0 2  Feeling bad or failure about yourself  1 0 2  Trouble concentrating 0 0 0  Moving slowly or fidgety/restless 0 0 0  Suicidal thoughts 0 0 0  PHQ-9 Score 7 5 9   Difficult doing work/chores Not difficult at all Not  difficult at all Not difficult at all    BP Readings from Last 3 Encounters:  07/25/23 128/74  04/25/23 128/78  01/20/23 130/78    Physical Exam Vitals and nursing note reviewed.  Constitutional:      General: She is not in acute distress.    Appearance: She is well-developed.  HENT:     Head: Normocephalic and atraumatic.  Neck:     Vascular: No carotid bruit.  Cardiovascular:     Rate and Rhythm: Normal rate and regular rhythm.     Pulses: Normal  pulses.     Heart sounds: No murmur heard. Pulmonary:     Effort: Pulmonary effort is normal. No respiratory distress.     Breath sounds: No wheezing or rhonchi.  Musculoskeletal:     Cervical back: Normal range of motion. No tenderness.     Right lower leg: No edema.     Left lower leg: No edema.  Lymphadenopathy:     Cervical: No cervical adenopathy.  Skin:    General: Skin is warm and dry.     Findings: No rash.  Neurological:     Mental Status: She is alert and oriented to person, place, and time.  Psychiatric:        Mood and Affect: Mood normal.        Behavior: Behavior normal.     Wt Readings from Last 3 Encounters:  07/25/23 219 lb (99.3 kg)  04/25/23 222 lb (100.7 kg)  01/20/23 233 lb (105.7 kg)    BP 128/74   Pulse 79   Ht 5\' 3"  (1.6 m)   Wt 219 lb (99.3 kg)   LMP  (LMP Unknown)   SpO2 98%   BMI 38.79 kg/m   Assessment and Plan:  Problem List Items Addressed This Visit       Unprioritized   Hypothyroidism due to acquired atrophy of thyroid - Primary (Chronic)    Supplemented Lab Results  Component Value Date   TSH 1.910 01/20/2023         Generalized anxiety disorder    Currently using clonazepam PRN only and doing fairly well      Relevant Medications   clonazePAM (KLONOPIN) 0.5 MG tablet   Essential hypertension (Chronic)    Normal exam with stable BP on lisinopril 10 mg. No concerns or side effects to current medication. No change in regimen; continue low sodium diet.        Class 2 obesity with body mass index (BMI) of 35 to 39.9 without comorbidity    Had severe abdominal pain after Semaglutide injections Now on Naltrexone 6 mg daily for appetite suppression Can consider a re-trial of a very low dose of Semaglutide since she still has a supply       Return in about 6 months (around 01/25/2024) for CPX.    [name], MD [facility] [place]

## 2023-07-25 NOTE — Assessment & Plan Note (Signed)
Supplemented Lab Results  Component Value Date   TSH 1.910 01/20/2023

## 2023-07-25 NOTE — Assessment & Plan Note (Signed)
>>  ASSESSMENT AND PLAN FOR CLASS 2 OBESITY WITH BODY MASS INDEX (BMI) OF 35 TO 39.9 WITHOUT COMORBIDITY WRITTEN ON 07/25/2023  3:48 PM BY BERGLUND, LEITA DEL, MD  Had severe abdominal pain after Semaglutide injections Now on Naltrexone 6 mg daily for appetite suppression Can consider a re-trial of a very low dose of Semaglutide since she still has a supply

## 2023-07-25 NOTE — Assessment & Plan Note (Signed)
Normal exam with stable BP on lisinopril 10 mg. No concerns or side effects to current medication. No change in regimen; continue low sodium diet.

## 2023-07-25 NOTE — Assessment & Plan Note (Signed)
Had severe abdominal pain after Semaglutide injections Now on Naltrexone 6 mg daily for appetite suppression Can consider a re-trial of a very low dose of Semaglutide since she still has a supply

## 2023-08-05 ENCOUNTER — Other Ambulatory Visit: Payer: Self-pay | Admitting: Internal Medicine

## 2023-08-05 DIAGNOSIS — I10 Essential (primary) hypertension: Secondary | ICD-10-CM

## 2023-09-04 ENCOUNTER — Encounter: Payer: Self-pay | Admitting: Internal Medicine

## 2023-09-04 ENCOUNTER — Telehealth: Payer: BC Managed Care – PPO | Admitting: Family Medicine

## 2023-09-04 DIAGNOSIS — M5431 Sciatica, right side: Secondary | ICD-10-CM | POA: Diagnosis not present

## 2023-09-04 MED ORDER — NAPROXEN 500 MG PO TABS: 500.0000 mg | ORAL_TABLET | Freq: Two times a day (BID) | ORAL | 0 refills | Status: DC

## 2023-09-04 MED ORDER — CYCLOBENZAPRINE HCL 10 MG PO TABS: 10.0000 mg | ORAL_TABLET | Freq: Two times a day (BID) | ORAL | 0 refills | Status: DC | PRN

## 2023-09-04 NOTE — Progress Notes (Signed)

## 2023-09-19 ENCOUNTER — Other Ambulatory Visit: Payer: Self-pay | Admitting: Internal Medicine

## 2023-09-19 DIAGNOSIS — K5901 Slow transit constipation: Secondary | ICD-10-CM

## 2023-09-20 NOTE — Telephone Encounter (Signed)
Requested Prescriptions  Pending Prescriptions Disp Refills   LINZESS 290 MCG CAPS capsule [Pharmacy Med Name: Karlene Einstein CAPSULES] 90 capsule 1    Sig: TAKE 1 CAPSULE(290 MCG) BY MOUTH DAILY BEFORE BREAKFAST     Gastroenterology: Irritable Bowel Syndrome Passed - 09/19/2023  5:40 PM      Passed - Valid encounter within last 12 months    Recent Outpatient Visits           1 month ago Hypothyroidism due to acquired atrophy of thyroid   [place] [facility] at [facility], Nyoka Cowden, MD   4 months ago Generalized anxiety disorder   [facility] at [facility], Nyoka Cowden, MD   8 months ago Annual physical exam   [facility] at [facility], Nyoka Cowden, MD   9 months ago Eustachian tube dysfunction, left   [facility] at [facility], Nyoka Cowden, MD   1 year ago Hearing loss due to cerumen impaction, right   [facility] at [facility], Nyoka Cowden, MD       Future Appointments             In 4 months Judithann Graves, Nyoka Cowden, MD [facility] Primary [facility] & [facility] Medicine at [facility], [facility]

## 2023-10-10 ENCOUNTER — Other Ambulatory Visit: Payer: Self-pay | Admitting: Internal Medicine

## 2023-10-10 DIAGNOSIS — E282 Polycystic ovarian syndrome: Secondary | ICD-10-CM

## 2023-10-10 DIAGNOSIS — K219 Gastro-esophageal reflux disease without esophagitis: Secondary | ICD-10-CM

## 2023-10-20 ENCOUNTER — Telehealth: Payer: BC Managed Care – PPO | Admitting: Physician Assistant

## 2023-10-20 DIAGNOSIS — R6889 Other general symptoms and signs: Secondary | ICD-10-CM

## 2023-10-20 DIAGNOSIS — R519 Headache, unspecified: Secondary | ICD-10-CM | POA: Diagnosis not present

## 2023-10-20 DIAGNOSIS — R059 Cough, unspecified: Secondary | ICD-10-CM

## 2023-10-20 DIAGNOSIS — J029 Acute pharyngitis, unspecified: Secondary | ICD-10-CM | POA: Diagnosis not present

## 2023-10-20 MED ORDER — PROMETHAZINE-DM 6.25-15 MG/5ML PO SYRP: 5.0000 mL | ORAL_SOLUTION | Freq: Four times a day (QID) | ORAL | 0 refills | Status: DC | PRN

## 2023-10-20 MED ORDER — OSELTAMIVIR PHOSPHATE 75 MG PO CAPS
75.0000 mg | ORAL_CAPSULE | Freq: Two times a day (BID) | ORAL | 0 refills | Status: DC
Start: 2023-10-20 — End: 2023-10-23

## 2023-10-20 NOTE — Progress Notes (Signed)
E visit for Flu like symptoms   We are sorry that you are not feeling well.  Here is how we plan to help! Based on what you have shared with me it looks like you may have a respiratory virus that may be influenza.  Influenza or "the flu" is   an infection caused by a respiratory virus. The flu virus is highly contagious and persons who did not receive their yearly flu vaccination may "catch" the flu from close contact.  We have anti-viral medications to treat the viruses that cause this infection. They are not a "cure" and only shorten the course of the infection. These prescriptions are most effective when they are given within the first 2 days of "flu" symptoms. Antiviral medication are indicated if you have a high risk of complications from the flu. You should  also consider an antiviral medication if you are in close contact with someone who is at risk. These medications can help patients avoid complications from the flu  but have side effects that you should know. Possible side effects from Tamiflu or oseltamivir include nausea, vomiting, diarrhea, dizziness, headaches, eye redness, sleep problems or other respiratory symptoms. You should not take Tamiflu if you have an allergy to oseltamivir or any to the ingredients in Tamiflu.  Based upon your symptoms and potential risk factors I have prescribed Oseltamivir (Tamiflu).  It has been sent to your designated pharmacy.  You will take one 75 mg capsule orally twice a day for the next 5 days.  I have also prescribed Promethazine DM for cough. Use 5mL every 6 hours as needed.  ANYONE WHO HAS FLU SYMPTOMS SHOULD: Stay home. The flu is highly contagious and going out or to work exposes others! Be sure to drink plenty of fluids. Water is fine as well as fruit juices, sodas and electrolyte beverages. You may want to stay away from caffeine or alcohol. If you are nauseated, try taking small sips of liquids. How do you know if you are getting enough  fluid? Your urine should be a pale yellow or almost colorless. Get rest. Taking a steamy shower or using a humidifier may help nasal congestion and ease sore throat pain. Using a saline nasal spray works much the same way. Cough drops, hard candies and sore throat lozenges may ease your cough. Line up a caregiver. Have someone check on you regularly.   GET HELP RIGHT AWAY IF: You cannot keep down liquids or your medications. You become short of breath Your fell like you are going to pass out or loose consciousness. Your symptoms persist after you have completed your treatment plan MAKE SURE YOU  Understand these instructions. Will watch your condition. Will get help right away if you are not doing well or get worse.  Your e-visit answers were reviewed by a board certified advanced clinical practitioner to complete your personal care plan.  Depending on the condition, your plan could have included both over the counter or prescription medications.  If there is a problem please reply  once you have received a response from your provider.  Your safety is important to [place].  If you have drug allergies check your prescription carefully.    You can use MyChart to ask questions about today's visit, request a non-urgent call back, or ask for a work or school excuse for 24 hours related to this e-Visit. If it has been greater than 24 hours you will need to follow up with your provider, or enter  a new e-Visit to address those concerns.  You will get an e-mail in the next two days asking about your experience.  I hope that your e-visit has been valuable and will speed your recovery. Thank you for using e-visits.   I have spent 5 minutes in review of e-visit questionnaire, review and updating patient chart, medical decision making and response to patient.   Margaretann Loveless, PA-C

## 2023-10-23 ENCOUNTER — Encounter: Payer: Self-pay | Admitting: Family Medicine

## 2023-10-23 ENCOUNTER — Ambulatory Visit (INDEPENDENT_AMBULATORY_CARE_PROVIDER_SITE_OTHER): Payer: BC Managed Care – PPO | Admitting: Family Medicine

## 2023-10-23 ENCOUNTER — Ambulatory Visit: Payer: Self-pay | Admitting: *Deleted

## 2023-10-23 VITALS — BP 128/74 | HR 86 | Temp 98.4°F | Ht 63.0 in | Wt 222.0 lb

## 2023-10-23 DIAGNOSIS — R059 Cough, unspecified: Secondary | ICD-10-CM

## 2023-10-23 DIAGNOSIS — J101 Influenza due to other identified influenza virus with other respiratory manifestations: Secondary | ICD-10-CM

## 2023-10-23 LAB — POC COVID19 BINAXNOW: SARS Coronavirus 2 Ag: NEGATIVE

## 2023-10-23 LAB — POCT INFLUENZA A/B
Influenza A, POC: POSITIVE — AB
Influenza B, POC: NEGATIVE

## 2023-10-23 MED ORDER — BENZONATATE 200 MG PO CAPS: 200.0000 mg | ORAL_CAPSULE | Freq: Three times a day (TID) | ORAL | 0 refills | Status: DC | PRN

## 2023-10-23 NOTE — Patient Instructions (Addendum)
-   Use Flonase (fluticasone propionate), 2 sprays in each nostril daily - Use Mucinex (guaifenesin) 12-hour formulation twice daily - Use Tessalon Perles (cough medicine)  3 times a day as-needed for cough - Use Tylenol every 4-6 hours to control symptoms - Get plenty of rest and hydration - Remain out of work until 24 hours of improvement noted

## 2023-10-23 NOTE — Telephone Encounter (Signed)
  Chief Complaint: Cough Symptoms: Pt did E-Visit Friday, prescribed Tamiflu. States entire body turned red, hot, pharmacist advised to stop taking; has since resolved. Pt with several co-workers with flu. States cough has worsened, congested, RX cough med not working. Headache, body aches at times. Temp Max at 103 Sunday, unsure today. Covid neg x 2.  Frequency: Friday Pertinent Negatives: Patient denies SOB Disposition: [] ED /[] Urgent Care (no appt availability in office) / [x] Appointment(In office/virtual)/ []  [place] [facility]/ [] Home Care/ [] Refused Recommended Disposition /[] [facility]/ []  Follow-up with PCP Additional Notes: Appt secured for today. Advised to wear mask. Care advise provided, pt verbalizes understanding. Reason for Disposition  SEVERE coughing spells (e.g., whooping sound after coughing, vomiting after coughing)  Answer Assessment - Initial Assessment Questions 1. ONSET: "When did the cough begin?"      Friday 2. SEVERITY: "How bad is the cough today?"      Congested 3. SPUTUM: "Describe the color of your sputum" (none, dry cough; clear, white, yellow, green)     Clear 4. HEMOPTYSIS: "Are you coughing up any blood?" If so ask: "How much?" (flecks, streaks, tablespoons, etc.)     No 5. DIFFICULTY BREATHING: "Are you having difficulty breathing?" If Yes, ask: "How bad is it?" (e.g., mild, moderate, severe)    - MILD: No SOB at rest, mild SOB with walking, speaks normally in sentences, can lie down, no retractions, pulse < 100.    - MODERATE: SOB at rest, SOB with minimal exertion and prefers to sit, cannot lie down flat, speaks in phrases, mild retractions, audible wheezing, pulse 100-120.    - SEVERE: Very SOB at rest, speaks in single words, struggling to breathe, sitting hunched forward, retractions, pulse > 120      Moderate 6. FEVER: "Do you have a fever?" If Yes, ask: "What is your temperature, how was it measured, and when did it start?"     Off  and on. Max 103.0  10. OTHER SYMPTOMS: "Do you have any other symptoms?" (e.g., runny nose, wheezing, chest pain)       Covid negative x2, headache, eyes irritated.  Protocols used: Cough - Acute Productive-A-AH

## 2023-10-23 NOTE — Assessment & Plan Note (Signed)
Pertinent History: - Reports onset of illness on 10/20/2023. - Coworkers experiencing flu. - Prescribed Tamiflu and promethazine DM syrup. - Experienced allergic reaction to Tamiflu: whole body redness and burning sensation. Tamiflu added to allergy list. - Pharmacist recommended Benadryl, took Allegra instead with symptom relief. - Took Tylenol until 10/21/2023, cough syrup until 10/21/2023 at 6 PM. - Cough syrup initially helpful, now causing insomnia. - Cough has worsened, now described as a deep, hacking cough, causing throat pain. - Constant headache and pressure between eyes. - Developing nasal drainage. - Denies breathing difficulty.  Exam: - Heart sounds normal. - Lungs clear. - Tender right posterior cervical lymph node, small left posterior cervical lymph node. - Left ear normal. - Right ear normal. - Nasal turbinates red, more on the left. - Tenderness on maxillary sinuses bilaterally.  Point-of-care flu and COVID test revealed positive influenza A  Impression: -Influenza A  Plan:  Flonase.  Mucinex 12-hour formulation twice daily.  Tesilon pearls as needed for cough.  Tylenol every 4-6 hours.  Motrin as needed for breakthrough pain.  Increase fluid intake.  Monitor for worsening symptoms.  Return to work when symptoms improve, plus one full day.  Work note for the week, may return sooner if symptoms permit.

## 2023-10-23 NOTE — Progress Notes (Signed)
     Primary Care / Sports Medicine Office Visit  Patient Information:  Patient ID: Cindy Pena, female DOB: 02-08-1971 Age: 52 y.o. MRN: [id number]   Cindy Pena is a pleasant 52 y.o. female presenting with the following:  Chief Complaint  Patient presents with   Cough    Patient was prescribed Tamiflu and Promethazine 3 days ago via VV. Patient unable to take tamiflu- patient said after an hour of taking this she had hot flashes, and felt like she was "on fire." Stopped taking this. Took Allegra. Still taking cough medicine as of last night. Only helps a little. Dry cough, headache, sinus pressure between eyes. No fever. No shortness of breath.     Vitals:   10/23/23 1458  BP: 128/74  Pulse: 86  Temp: 98.4 F (36.9 C)  SpO2: 100%   Vitals:   10/23/23 1458  Weight: 222 lb (100.7 kg)  Height: 5\' 3"  (1.6 m)   Body mass index is 39.33 kg/m.  No results found.   Independent interpretation of notes and tests performed by another provider:   None  Procedures performed:   None  Pertinent History, Exam, Impression, and Recommendations:   Problem List Items Addressed This Visit       Respiratory   Influenza A - Primary    Pertinent History: - Reports onset of illness on 10/20/2023. - Coworkers experiencing flu. - Prescribed Tamiflu and promethazine DM syrup. - Experienced allergic reaction to Tamiflu: whole body redness and burning sensation. Tamiflu added to allergy list. - Pharmacist recommended Benadryl, took Allegra instead with symptom relief. - Took Tylenol until 10/21/2023, cough syrup until 10/21/2023 at 6 PM. - Cough syrup initially helpful, now causing insomnia. - Cough has worsened, now described as a deep, hacking cough, causing throat pain. - Constant headache and pressure between eyes. - Developing nasal drainage. - Denies breathing difficulty.  Exam: - Heart sounds normal. - Lungs clear. - Tender right posterior cervical  lymph node, small left posterior cervical lymph node. - Left ear normal. - Right ear normal. - Nasal turbinates red, more on the left. - Tenderness on maxillary sinuses bilaterally.  Point-of-care flu and COVID test revealed positive influenza A  Impression: -Influenza A  Plan:  Flonase.  Mucinex 12-hour formulation twice daily.  Tesilon pearls as needed for cough.  Tylenol every 4-6 hours.  Motrin as needed for breakthrough pain.  Increase fluid intake.  Monitor for worsening symptoms.  Return to work when symptoms improve, plus one full day.  Work note for the week, may return sooner if symptoms permit.      Relevant Medications   benzonatate (TESSALON) 200 MG capsule   Other Visit Diagnoses     Cough, unspecified type       Relevant Orders   POC COVID-19 (Completed)   POCT Influenza A/B (Completed)        Orders & Medications Medications:  Meds ordered this encounter  Medications   benzonatate (TESSALON) 200 MG capsule    Sig: Take 1 capsule (200 mg total) by mouth 3 (three) times daily as needed for cough.    Dispense:  45 capsule    Refill:  0   Orders Placed This Encounter  Procedures   POC COVID-19   POCT Influenza A/B     No follow-ups on file.     Jerrol Banana, MD, [facility]   Primary Care Sports Medicine Primary Care and Sports Medicine at [facility]

## 2023-10-24 ENCOUNTER — Encounter: Payer: Self-pay | Admitting: Family Medicine

## 2023-10-24 NOTE — Telephone Encounter (Signed)
Please review.  KP

## 2024-01-26 ENCOUNTER — Ambulatory Visit (INDEPENDENT_AMBULATORY_CARE_PROVIDER_SITE_OTHER): Payer: BC Managed Care – PPO | Admitting: Internal Medicine

## 2024-01-26 ENCOUNTER — Encounter: Payer: Self-pay | Admitting: Internal Medicine

## 2024-01-26 VITALS — BP 128/84 | HR 76 | Ht 63.0 in | Wt 227.0 lb

## 2024-01-26 DIAGNOSIS — I10 Essential (primary) hypertension: Secondary | ICD-10-CM | POA: Diagnosis not present

## 2024-01-26 DIAGNOSIS — F411 Generalized anxiety disorder: Secondary | ICD-10-CM

## 2024-01-26 DIAGNOSIS — M720 Palmar fascial fibromatosis [Dupuytren]: Secondary | ICD-10-CM | POA: Insufficient documentation

## 2024-01-26 DIAGNOSIS — L309 Dermatitis, unspecified: Secondary | ICD-10-CM

## 2024-01-26 DIAGNOSIS — K219 Gastro-esophageal reflux disease without esophagitis: Secondary | ICD-10-CM

## 2024-01-26 DIAGNOSIS — Z1231 Encounter for screening mammogram for malignant neoplasm of breast: Secondary | ICD-10-CM

## 2024-01-26 DIAGNOSIS — E282 Polycystic ovarian syndrome: Secondary | ICD-10-CM

## 2024-01-26 DIAGNOSIS — E785 Hyperlipidemia, unspecified: Secondary | ICD-10-CM

## 2024-01-26 DIAGNOSIS — Z0000 Encounter for general adult medical examination without abnormal findings: Secondary | ICD-10-CM | POA: Diagnosis not present

## 2024-01-26 DIAGNOSIS — E034 Atrophy of thyroid (acquired): Secondary | ICD-10-CM

## 2024-01-26 MED ORDER — TRIAMCINOLONE ACETONIDE 0.1 % EX CREA: 1.0000 | TOPICAL_CREAM | Freq: Two times a day (BID) | CUTANEOUS | 0 refills | Status: DC

## 2024-01-26 NOTE — Assessment & Plan Note (Signed)
Managed with diet alone Lab Results  Component Value Date   LDLCALC 138 (H) 01/20/2023

## 2024-01-26 NOTE — Assessment & Plan Note (Addendum)
Controlled on lisinopril but missed dose today. Continue current medications but work on daily dosing Will follow up in 6 months - adjust dose then if needed

## 2024-01-26 NOTE — Assessment & Plan Note (Signed)
On metformin for many years A1C has been normal.

## 2024-01-26 NOTE — Patient Instructions (Signed)
Call [facility] to schedule your mammogram at [phone].

## 2024-01-26 NOTE — Assessment & Plan Note (Signed)
Use clonazepam only PRN  Has used less than 60 tabs in the past 6 months.

## 2024-01-26 NOTE — Assessment & Plan Note (Signed)
Supplemented Lab Results  Component Value Date   TSH 1.910 01/20/2023

## 2024-01-26 NOTE — Progress Notes (Signed)
Date:  01/26/2024   Name:  Cindy Pena   DOB:  Apr 17, 1971   MRN:  [id number]   Chief Complaint: Annual Exam Bret Stamour is a 53 y.o. female who presents today for her Complete Annual Exam. She feels well. She reports exercising exercise bike 1 time a month. She reports she is sleeping fairly well. Breast complaints none.  Health Maintenance  Topic Date Due   HIV Screening  Never done   Mammogram  03/12/2023   COVID-19 Vaccine (3 - 2024-25 season) 08/13/2023   Flu Shot  03/11/2024*   Zoster (Shingles) Vaccine (1 of 2) 04/24/2024*   DTaP/Tdap/Td vaccine (2 - Td or Tdap) 07/07/2025   Colon Cancer Screening  02/12/2031   Hepatitis C Screening  Completed   HPV Vaccine  Aged Out  *Topic was postponed. The date shown is not the original due date.     Hypertension This is a chronic problem. The problem is controlled. Pertinent negatives include no chest pain, headaches, palpitations or shortness of breath. Past treatments include ACE inhibitors. Identifiable causes of hypertension include a thyroid problem.  Gastroesophageal Reflux She complains of heartburn. She reports no abdominal pain, no chest pain, no coughing or no wheezing. This is a recurrent problem. The problem occurs occasionally. Pertinent negatives include no fatigue. She has tried a PPI for the symptoms.  Thyroid Problem Presents for follow-up visit. Patient reports no constipation, diarrhea, fatigue or palpitations. The symptoms have been stable.    Review of Systems  Constitutional:  Negative for fatigue and unexpected weight change.  HENT:  Negative for nosebleeds.   Eyes:  Negative for visual disturbance.  Respiratory:  Negative for cough, chest tightness, shortness of breath and wheezing.   Cardiovascular:  Negative for chest pain, palpitations and leg swelling.  Gastrointestinal:  Positive for heartburn. Negative for abdominal pain, constipation and diarrhea.  Neurological:  Negative for  dizziness, weakness, light-headedness and headaches.     Lab Results  Component Value Date   NA 142 01/20/2023   K 4.1 01/20/2023   CO2 24 01/20/2023   GLUCOSE 94 01/20/2023   BUN 21 01/20/2023   CREATININE 0.85 01/20/2023   CALCIUM 9.7 01/20/2023   EGFR 82 01/20/2023   GFRNONAA 75 02/07/2020   Lab Results  Component Value Date   CHOL 237 (H) 01/20/2023   HDL 49 01/20/2023   LDLCALC 138 (H) 01/20/2023   TRIG 278 (H) 01/20/2023   CHOLHDL 4.8 (H) 01/20/2023   Lab Results  Component Value Date   TSH 1.910 01/20/2023   Lab Results  Component Value Date   HGBA1C 5.2 01/20/2023   Lab Results  Component Value Date   WBC 8.9 01/20/2023   HGB 13.9 01/20/2023   HCT 40.8 01/20/2023   MCV 85 01/20/2023   PLT 278 01/20/2023   Lab Results  Component Value Date   ALT 26 01/20/2023   AST 25 01/20/2023   ALKPHOS 113 01/20/2023   BILITOT 0.4 01/20/2023   Lab Results  Component Value Date   VD25OH 23.1 (L) 06/30/2021     Patient Active Problem List   Diagnosis Date Noted   Obesity, morbid (HCC) 01/26/2024   Palmar fascial fibromatosis (dupuytren) 01/26/2024   Generalized anxiety disorder 07/09/2021   Hepatic steatosis 02/20/2019   Mild hyperlipidemia 02/02/2019   Hypothyroidism due to acquired atrophy of thyroid 02/01/2019   Essential hypertension 05/21/2018   Hand eczema 10/20/2017   Class 2 obesity with body mass index (BMI) of 35 to 39.9  without comorbidity 04/19/2016   Gastroesophageal reflux disease without esophagitis 07/08/2015   PCOS (polycystic ovarian syndrome) 07/08/2015    Allergies  Allergen Reactions   Darvon [Propoxyphene] Other (See Comments)    Chest Pain.   Penicillins Other (See Comments)    Migraines.   Tamiflu [Oseltamivir] Other (See Comments)    Felt like body was "on fire."   Tramadol Hcl Rash   Other Other (See Comments)    Past Surgical History:  Procedure Laterality Date   c section  1996   September- SON Birth   CESAREAN  SECTION  1996   CHOLECYSTECTOMY  2003   COLONOSCOPY WITH PROPOFOL N/A 02/11/2021   Procedure: COLONOSCOPY WITH PROPOFOL;  Surgeon: Toney Reil, MD;  Location: [facility] END[facility]COPY;  Service: Gastroenterology;  Laterality: N/A;   DILATION AND CURETTAGE OF UTERUS  1996 & 2006   ooph Right 1995   Right tube removed- mass was on tube.   TOTAL ABDOMINAL HYSTERECTOMY  2009   Right ovary attached to center of stomach.    Social History   Tobacco Use   Smoking status: Never   Smokeless tobacco: Never  Vaping Use   Vaping status: Never Used  Substance Use Topics   Alcohol use: No   Drug use: No     Medication list has been reviewed and updated.  Current Meds  Medication Sig   clonazePAM (KLONOPIN) 0.5 MG tablet Take 1 tablet (0.5 mg total) by mouth 2 (two) times daily as needed for anxiety.   docusate sodium (COLACE) 100 MG capsule Take 100 mg by mouth 2 (two) times daily.   levothyroxine (SYNTHROID) 112 MCG tablet TAKE 1 TABLET(112 MCG) BY MOUTH DAILY BEFORE BREAKFAST   LINZESS 290 MCG CAPS capsule TAKE 1 CAPSULE(290 MCG) BY MOUTH DAILY BEFORE BREAKFAST   lisinopril (ZESTRIL) 10 MG tablet TAKE 1 TABLET(10 MG) BY MOUTH DAILY   metFORMIN (GLUCOPHAGE) 500 MG tablet TAKE 1 TABLET(500 MG) BY MOUTH DAILY WITH BREAKFAST   Misc Natural Products (ESTROVEN + ENERGY MAX STRENGTH PO) Take by mouth daily.   omeprazole (PRILOSEC) 20 MG capsule TAKE 1 CAPSULE BY MOUTH DAILY   triamcinolone cream (KENALOG) 0.1 % Apply 1 Application topically 2 (two) times daily. To rash on hand       01/26/2024    8:43 AM 07/25/2023    3:33 PM 04/25/2023    2:04 PM 12/07/2022    3:32 PM  GAD 7 : Generalized Anxiety Score  Nervous, Anxious, on Edge 2 2 3 2   Control/stop worrying 1 1 2 1   Worry too much - different things 1 1 0 1  Trouble relaxing 1 0 1 1  Restless 1 0 1 1  Easily annoyed or irritable 3 0 0 0  Afraid - awful might happen 1 1 2 2   Total GAD 7 Score 10 5 9 8   Anxiety Difficulty Not difficult  at all Not difficult at all Not difficult at all Not difficult at all       01/26/2024    8:42 AM 07/25/2023    3:33 PM 04/25/2023    2:04 PM  Depression screen PHQ 2/9  Decreased Interest 0 0 1  Down, Depressed, Hopeless 1 1 0  PHQ - 2 Score 1 1 1   Altered sleeping  3 2  Tired, decreased energy  1 2  Change in appetite  1 0  Feeling bad or failure about yourself   1 0  Trouble concentrating  0 0  Moving slowly  or fidgety/restless  0 0  Suicidal thoughts  0 0  PHQ-9 Score  7 5  Difficult doing work/chores  Not difficult at all Not difficult at all    BP Readings from Last 3 Encounters:  01/26/24 128/84  10/23/23 128/74  07/25/23 128/74    Physical Exam Vitals and nursing note reviewed.  Constitutional:      General: She is not in acute distress.    Appearance: She is well-developed.  HENT:     Head: Normocephalic and atraumatic.     Right Ear: Tympanic membrane and ear canal normal.     Left Ear: Tympanic membrane and ear canal normal.     Nose:     Right Sinus: No maxillary sinus tenderness.     Left Sinus: No maxillary sinus tenderness.  Eyes:     General: No scleral icterus.       Right eye: No discharge.        Left eye: No discharge.     Conjunctiva/sclera: Conjunctivae normal.  Neck:     Thyroid: No thyromegaly.     Vascular: No carotid bruit.  Cardiovascular:     Rate and Rhythm: Normal rate and regular rhythm.     Pulses: Normal pulses.     Heart sounds: Normal heart sounds.  Pulmonary:     Effort: Pulmonary effort is normal. No respiratory distress.     Breath sounds: No wheezing.  Abdominal:     General: Bowel sounds are normal.     Palpations: Abdomen is soft.     Tenderness: There is no abdominal tenderness.  Musculoskeletal:     Cervical back: Normal range of motion. No erythema.     Right lower leg: No edema.     Left lower leg: No edema.     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   Skin:    General: Skin is warm and dry.     Findings: Rash (on left palm) present.  Neurological:     Mental Status: She is alert and oriented to person, place, and time.     Cranial Nerves: No cranial nerve deficit.     Sensory: No sensory deficit.     Deep Tendon Reflexes: Reflexes are normal and symmetric.  Psychiatric:        Attention and Perception: Attention normal.        Mood and Affect: Mood normal.     Wt Readings from Last 3 Encounters:  01/26/24 227 lb (103 kg)  10/23/23 222 lb (100.7 kg)  07/25/23 219 lb (99.3 kg)    BP 128/84   Pulse 76   Ht 5\' 3"  (1.6 m)   Wt 227 lb (103 kg)   LMP  (LMP Unknown)   SpO2 98%   BMI 40.21 kg/m   Assessment and Plan:  Problem List Items Addressed This Visit       Unprioritized   Gastroesophageal reflux disease without esophagitis (Chronic)   Reflux symptoms are minimal on current therapy - omeprazole. No red flag signs such as weight loss, n/v, melena       Relevant Orders   CBC with Differential/Platelet   PCOS (polycystic ovarian syndrome) (Chronic)   On metformin for many years A1C has been normal.      Relevant Orders   Comprehensive metabolic panel   Essential hypertension (Chronic)   Controlled on lisinopril but missed dose today. Continue current medications but work on daily  dosing Will follow up in 6 months - adjust dose then if needed      Relevant Orders   CBC with Differential/Platelet   Comprehensive metabolic panel   Urinalysis, Routine w reflex microscopic   Hypothyroidism due to acquired atrophy of thyroid (Chronic)   Supplemented. Lab Results  Component Value Date   TSH 1.910 01/20/2023         Relevant Orders   TSH + free T4   Mild hyperlipidemia (Chronic)   Managed with diet alone Lab Results  Component Value Date   LDLCALC 138 (H) 01/20/2023         Relevant Orders   Lipid panel   Hand eczema   Relevant Medications   triamcinolone cream (KENALOG) 0.1 %   Generalized  anxiety disorder   Use clonazepam only PRN  Has used less than 60 tabs in the past 6 months.      Obesity, morbid (HCC)   Palmar fascial fibromatosis (dupuytren)   Of the right hand Minimally symptomatic      Other Visit Diagnoses       Annual physical exam    -  Primary   Relevant Orders   CBC with Differential/Platelet   Comprehensive metabolic panel   Hemoglobin A1c   Lipid panel   TSH + free T4   Urinalysis, Routine w reflex microscopic     Encounter for screening mammogram for breast cancer       Relevant Orders   MM 3D SCREENING MAMMOGRAM BILATERAL BREAST       Return in about 6 months (around 07/25/2024) for HTN.    [name], MD [facility] Primary Care and Sports Medicine [place]

## 2024-01-26 NOTE — Assessment & Plan Note (Signed)
Reflux symptoms are minimal on current therapy - omeprazole. No red flag signs such as weight loss, n/v, melena

## 2024-01-26 NOTE — Assessment & Plan Note (Signed)
Of the right hand Minimally symptomatic

## 2024-01-27 ENCOUNTER — Encounter: Payer: Self-pay | Admitting: Internal Medicine

## 2024-01-27 LAB — CBC WITH DIFFERENTIAL/PLATELET
Basophils Absolute: 0.1 10*3/uL (ref 0.0–0.2)
Basos: 1 %
EOS (ABSOLUTE): 0.2 10*3/uL (ref 0.0–0.4)
Eos: 3 %
Hematocrit: 42 % (ref 34.0–46.6)
Hemoglobin: 13.9 g/dL (ref 11.1–15.9)
Immature Grans (Abs): 0 10*3/uL (ref 0.0–0.1)
Immature Granulocytes: 0 %
Lymphocytes Absolute: 2.3 10*3/uL (ref 0.7–3.1)
Lymphs: 31 %
MCH: 28.4 pg (ref 26.6–33.0)
MCHC: 33.1 g/dL (ref 31.5–35.7)
MCV: 86 fL (ref 79–97)
Monocytes Absolute: 0.4 10*3/uL (ref 0.1–0.9)
Monocytes: 6 %
Neutrophils Absolute: 4.3 10*3/uL (ref 1.4–7.0)
Neutrophils: 59 %
Platelets: 260 10*3/uL (ref 150–450)
RBC: 4.9 x10E6/uL (ref 3.77–5.28)
RDW: 12.9 % (ref 11.7–15.4)
WBC: 7.2 10*3/uL (ref 3.4–10.8)

## 2024-01-27 LAB — COMPREHENSIVE METABOLIC PANEL
ALT: 28 [IU]/L (ref 0–32)
AST: 26 [IU]/L (ref 0–40)
Albumin: 4.5 g/dL (ref 3.8–4.9)
Alkaline Phosphatase: 117 [IU]/L (ref 44–121)
BUN/Creatinine Ratio: 20 (ref 9–23)
BUN: 19 mg/dL (ref 6–24)
Bilirubin Total: 0.4 mg/dL (ref 0.0–1.2)
CO2: 23 mmol/L (ref 20–29)
Calcium: 9.8 mg/dL (ref 8.7–10.2)
Chloride: 103 mmol/L (ref 96–106)
Creatinine, Ser: 0.97 mg/dL (ref 0.57–1.00)
Globulin, Total: 2.4 g/dL (ref 1.5–4.5)
Glucose: 97 mg/dL (ref 70–99)
Potassium: 5.3 mmol/L — ABNORMAL HIGH (ref 3.5–5.2)
Sodium: 143 mmol/L (ref 134–144)
Total Protein: 6.9 g/dL (ref 6.0–8.5)
eGFR: 70 mL/min/{1.73_m2} (ref >59)

## 2024-01-27 LAB — LIPID PANEL
Chol/HDL Ratio: 4.6 {ratio} — ABNORMAL HIGH (ref 0.0–4.4)
Cholesterol, Total: 226 mg/dL — ABNORMAL HIGH (ref 100–199)
HDL: 49 mg/dL (ref >39)
LDL Chol Calc (NIH): 143 mg/dL — ABNORMAL HIGH (ref 0–99)
Triglycerides: 189 mg/dL — ABNORMAL HIGH (ref 0–149)
VLDL Cholesterol Cal: 34 mg/dL (ref 5–40)

## 2024-01-27 LAB — HEMOGLOBIN A1C
Est. average glucose Bld gHb Est-mCnc: 108 mg/dL
Hgb A1c MFr Bld: 5.4 % (ref 4.8–5.6)

## 2024-01-27 LAB — URINALYSIS, ROUTINE W REFLEX MICROSCOPIC
Bilirubin, UA: NEGATIVE
Glucose, UA: NEGATIVE
Ketones, UA: NEGATIVE
Leukocytes,UA: NEGATIVE
Nitrite, UA: NEGATIVE
Protein,UA: NEGATIVE
RBC, UA: NEGATIVE
Specific Gravity, UA: 1.023 (ref 1.005–1.030)
Urobilinogen, Ur: 0.2 mg/dL (ref 0.2–1.0)
pH, UA: 5.5 (ref 5.0–7.5)

## 2024-01-27 LAB — TSH+FREE T4
Free T4: 1.45 ng/dL (ref 0.82–1.77)
TSH: 3.99 u[IU]/mL (ref 0.450–4.500)

## 2024-02-01 ENCOUNTER — Other Ambulatory Visit: Payer: Self-pay | Admitting: Internal Medicine

## 2024-02-01 ENCOUNTER — Encounter: Payer: Self-pay | Admitting: Internal Medicine

## 2024-02-01 DIAGNOSIS — F411 Generalized anxiety disorder: Secondary | ICD-10-CM

## 2024-02-01 MED ORDER — CLONAZEPAM 0.5 MG PO TABS: 0.5000 mg | ORAL_TABLET | Freq: Two times a day (BID) | ORAL | 1 refills | Status: DC | PRN

## 2024-02-03 ENCOUNTER — Other Ambulatory Visit: Payer: Self-pay | Admitting: Internal Medicine

## 2024-02-03 DIAGNOSIS — I10 Essential (primary) hypertension: Secondary | ICD-10-CM

## 2024-02-05 NOTE — Telephone Encounter (Signed)
 OV 01/26/24 Requested Prescriptions  Pending Prescriptions Disp Refills   lisinopril (ZESTRIL) 10 MG tablet [Pharmacy Med Name: LISINOPRIL 10MG  TABLETS] 90 tablet 1    Sig: TAKE 1 TABLET(10 MG) BY MOUTH DAILY     Cardiovascular:  ACE Inhibitors Failed - 02/05/2024  1:29 PM      Failed - K in normal range and within 180 days    Potassium  Date Value Ref Range Status  01/26/2024 5.3 (H) 3.5 - 5.2 mmol/L Final         Failed - Valid encounter within last 6 months    Recent Outpatient Visits           3 months ago Influenza A   [place] Primary Care & Sports Medicine at [facility] [place] [facility] Royalty, Ocie Bob, MD   6 months ago Hypothyroidism due to acquired atrophy of thyroid   [facility] Primary Care & Sports Medicine at [facility], Nyoka Cowden, MD   9 months ago Generalized anxiety disorder   [facility] Health Primary Care & Sports Medicine at [facility], Nyoka Cowden, MD   1 year ago Annual physical exam   [facility] Primary Care & Sports Medicine at [facility], Nyoka Cowden, MD   1 year ago Eustachian tube dysfunction, left   [facility] Health Primary Care & Sports Medicine at [facility], Nyoka Cowden, MD       Future Appointments             In 5 months [name], MD [facility] Health Primary Care & Sports Medicine at [facility], [facility]            Passed - Cr in normal range and within 180 days    Creatinine, Ser  Date Value Ref Range Status  01/26/2024 0.97 0.57 - 1.00 mg/dL Final         Passed - Patient is not pregnant      Passed - Last BP in normal range    BP Readings from Last 1 Encounters:  01/26/24 128/84

## 2024-02-14 ENCOUNTER — Telehealth: Admitting: Physician Assistant

## 2024-02-14 DIAGNOSIS — J302 Other seasonal allergic rhinitis: Secondary | ICD-10-CM | POA: Diagnosis not present

## 2024-02-14 MED ORDER — DOXYCYCLINE HYCLATE 100 MG PO TABS: 100.0000 mg | ORAL_TABLET | Freq: Two times a day (BID) | ORAL | 0 refills | Status: DC

## 2024-02-14 NOTE — Progress Notes (Signed)
 E visit for Allergic Rhinitis We are sorry that you are not feeling well.  Here is how we plan to help!  Based on what you have shared with me it looks like you have Allergic Rhinitis.  Rhinitis is when a reaction occurs that causes nasal congestion, runny nose, sneezing, and itching.  Most types of rhinitis are caused by an inflammation and are associated with symptoms in the eyes ears or throat. There are several types of rhinitis.  The most common are acute rhinitis, which is usually caused by a viral illness, allergic or seasonal rhinitis, and nonallergic or year-round rhinitis.  Nasal allergies occur certain times of the year.  Allergic rhinitis is caused when allergens in the air trigger the release of histamine in the body.  Histamine causes itching, swelling, and fluid to build up in the fragile linings of the nasal passages, sinuses and eyelids.  An itchy nose and clear discharge are common.  I recommend the following over the counter treatments: You should take a daily dose of antihistamine. I recommend Xyzal 5 mg take 1 tablet daily  I also would recommend using a saline nasal rinse to flush out nasal passages. Can do a couple of times per day, one of these before use of your nasal steroid spray.   I am concerned for the possibility of a secondary infection (sinusitis) giving progression of symptoms. I am adding on an antibiotic, Doxycycline, to take as directed.  HOME CARE:  You can use an over-the-counter saline nasal spray as needed Avoid areas where there is heavy dust, mites, or molds Stay indoors on windy days during the pollen season Keep windows closed in home, at least in bedroom; use air conditioner. Use high-efficiency house air filter Keep windows closed in car, turn AC on re-circulate Avoid playing out with dog during pollen season  GET HELP RIGHT AWAY IF:  If your symptoms do not improve within 10 days You become short of breath You develop yellow or green  discharge from your nose for over 3 days You have coughing fits  MAKE SURE YOU:  Understand these instructions Will watch your condition Will get help right away if you are not doing well or get worse  Thank you for choosing an e-visit. Your e-visit answers were reviewed by a board certified advanced clinical practitioner to complete your personal care plan. Depending upon the condition, your plan could have included both over the counter or prescription medications. Please review your pharmacy choice. Be sure that the pharmacy you have chosen is open so that you can pick up your prescription now.  If there is a problem you may message your provider in MyChart to have the prescription routed to another pharmacy. Your safety is important to [place]. If you have drug allergies check your prescription carefully.  For the next 24 hours, you can use MyChart to ask questions about today's visit, request a non-urgent call back, or ask for a work or school excuse from your e-visit provider. You will get an email in the next two days asking about your experience. I hope that your e-visit has been valuable and will speed your recovery.

## 2024-02-14 NOTE — Progress Notes (Signed)
 I have spent 5 minutes in review of e-visit questionnaire, review and updating patient chart, medical decision making and response to patient.   Piedad Climes, PA-C

## 2024-03-01 ENCOUNTER — Other Ambulatory Visit: Payer: Self-pay

## 2024-03-01 ENCOUNTER — Other Ambulatory Visit: Payer: Self-pay | Admitting: Internal Medicine

## 2024-03-01 ENCOUNTER — Encounter: Payer: Self-pay | Admitting: Internal Medicine

## 2024-03-01 DIAGNOSIS — E282 Polycystic ovarian syndrome: Secondary | ICD-10-CM

## 2024-03-01 MED ORDER — METFORMIN HCL 500 MG PO TABS: 500.0000 mg | ORAL_TABLET | Freq: Two times a day (BID) | ORAL | 1 refills | Status: DC

## 2024-03-01 MED ORDER — METFORMIN HCL 500 MG PO TABS: ORAL_TABLET | ORAL | 1 refills | Status: DC

## 2024-03-01 NOTE — Telephone Encounter (Signed)
 Pt response.  KP

## 2024-03-01 NOTE — Telephone Encounter (Signed)
 Requested Prescriptions  Pending Prescriptions Disp Refills   levothyroxine (SYNTHROID) 112 MCG tablet [Pharmacy Med Name: LEVOTHYROXINE 0.112MG  ( ) TABS] 90 tablet 3    Sig: TAKE 1 TABLET(112 MCG) BY MOUTH DAILY BEFORE BREAKFAST     Endocrinology:  Hypothyroid Agents Passed - 03/01/2024  5:49 PM      Passed - TSH in normal range and within 360 days    TSH  Date Value Ref Range Status  01/26/2024 3.990 0.450 - 4.500 uIU/mL Final         Passed - Valid encounter within last 12 months    Recent Outpatient Visits           4 months ago Influenza A   [place] [facility] at [facility] [place] [facility], Ocie Bob, MD   7 months ago Hypothyroidism due to acquired atrophy of thyroid   [facility] Health Primary Care & Sports Medicine at [facility], Nyoka Cowden, MD   10 months ago Generalized anxiety disorder   [facility] Primary Care & Sports Medicine at [facility], Nyoka Cowden, MD   1 year ago Annual physical exam   [facility] Health Primary Care & Sports Medicine at [facility], Nyoka Cowden, MD   1 year ago Eustachian tube dysfunction, left   [facility] Primary Care & Sports Medicine at [facility], Nyoka Cowden, MD       Future Appointments             In 4 months Judithann Graves, Nyoka Cowden, MD [facility] Health Primary Care & Sports Medicine at [facility], [facility]

## 2024-03-01 NOTE — Telephone Encounter (Signed)
 Please review.  KP

## 2024-03-29 ENCOUNTER — Other Ambulatory Visit: Payer: Self-pay | Admitting: Internal Medicine

## 2024-03-29 DIAGNOSIS — K5901 Slow transit constipation: Secondary | ICD-10-CM

## 2024-04-01 NOTE — Telephone Encounter (Signed)
 Requested Prescriptions  Pending Prescriptions Disp Refills   LINZESS  290 MCG CAPS capsule [Pharmacy Med Name: LINZESS  CAPSULES] 90 capsule 1    Sig: TAKE 1 CAPSULE(290 MCG) BY MOUTH DAILY BEFORE BREAKFAST     Gastroenterology: Irritable Bowel Syndrome Passed - 04/01/2024  8:43 AM      Passed - Valid encounter within last 12 months    Recent Outpatient Visits           2 months ago Annual physical exam   [facility] Health Primary Care & Sports Medicine at [facility], Chales [place], MD       Future Appointments             In 3 months Gala Jubilee, [place]ales [place], MD [facility] Health Primary Care & Sports Medicine at [facility], [facility]

## 2024-04-01 NOTE — Telephone Encounter (Signed)
 Duplicate request.  Requested Prescriptions  Refused Prescriptions Disp Refills   LINZESS  290 MCG CAPS capsule [Pharmacy Med Name: LINZESS  CAPSULES] 90 capsule 1    Sig: TAKE 1 CAPSULE(290 MCG) BY MOUTH DAILY BEFORE BREAKFAST     There is no refill protocol information for this order

## 2024-04-10 ENCOUNTER — Other Ambulatory Visit: Payer: Self-pay | Admitting: Internal Medicine

## 2024-04-10 DIAGNOSIS — K219 Gastro-esophageal reflux disease without esophagitis: Secondary | ICD-10-CM

## 2024-04-13 ENCOUNTER — Encounter: Payer: Self-pay | Admitting: Internal Medicine

## 2024-04-13 NOTE — Telephone Encounter (Signed)
 Requested Prescriptions  Pending Prescriptions Disp Refills   omeprazole  (PRILOSEC) 20 MG capsule [Pharmacy Med Name: OMEPRAZOLE  20MG  CAPSULES] 90 capsule 1    Sig: TAKE 1 CAPSULE BY MOUTH DAILY     Gastroenterology: Proton Pump Inhibitors Passed - 04/13/2024  9:00 PM      Passed - Valid encounter within last 12 months    Recent Outpatient Visits           2 months ago Annual physical exam   [facility] Primary Care & Sports Medicine at [facility], Chales [place], MD       Future Appointments             In 2 months Gala Jubilee, Chales [place], MD [facility] Health Primary Care & Sports Medicine at [facility], [facility]

## 2024-05-01 ENCOUNTER — Encounter: Payer: Self-pay | Admitting: Internal Medicine

## 2024-05-01 NOTE — Telephone Encounter (Signed)
 Please review and advise.   JM

## 2024-05-05 ENCOUNTER — Telehealth: Admitting: Family Medicine

## 2024-05-05 DIAGNOSIS — M5441 Lumbago with sciatica, right side: Secondary | ICD-10-CM | POA: Diagnosis not present

## 2024-05-05 MED ORDER — CYCLOBENZAPRINE HCL 10 MG PO TABS: 10.0000 mg | ORAL_TABLET | Freq: Three times a day (TID) | ORAL | 0 refills | Status: DC | PRN

## 2024-05-05 MED ORDER — PREDNISONE 20 MG PO TABS: 20.0000 mg | ORAL_TABLET | Freq: Two times a day (BID) | ORAL | 0 refills | Status: AC

## 2024-05-05 NOTE — Progress Notes (Signed)

## 2024-06-18 ENCOUNTER — Encounter: Payer: Self-pay | Admitting: Student

## 2024-06-18 ENCOUNTER — Ambulatory Visit (INDEPENDENT_AMBULATORY_CARE_PROVIDER_SITE_OTHER): Admitting: Student

## 2024-06-18 VITALS — BP 144/86 | HR 88 | Ht 63.0 in | Wt 237.0 lb

## 2024-06-18 DIAGNOSIS — Z6841 Body Mass Index (BMI) 40.0 and over, adult: Secondary | ICD-10-CM | POA: Insufficient documentation

## 2024-06-18 DIAGNOSIS — I10 Essential (primary) hypertension: Secondary | ICD-10-CM | POA: Diagnosis not present

## 2024-06-18 DIAGNOSIS — F411 Generalized anxiety disorder: Secondary | ICD-10-CM

## 2024-06-18 MED ORDER — BUPROPION HCL ER (XL) 150 MG PO TB24: 150.0000 mg | ORAL_TABLET | Freq: Every day | ORAL | 2 refills | Status: DC

## 2024-06-18 NOTE — Assessment & Plan Note (Addendum)
 Weight today is 237 lbs, gain 10 pounds since last visit in February.  Is fairly sedentary. Goal to increase biking to twice a week and walk about 3 days a week. Referral made to RD to discuss dietary questions. She is working on reducing snacking especially sweets.

## 2024-06-18 NOTE — Assessment & Plan Note (Addendum)
 GAD and PHQ9 scores are significantly higher than previous.  Does not want to try SSRI due to weight gain concerns. Would like to try bupropion  as she thinks it helped with mood and having no issues with constipation since she is taking miralax daily and is on linzess . Start bupropion  XL 150 mg daily. She plans to find a therapist. CBT-I coach for difficulty sleeping. Continue clonazepam  as needed. Follow up in 2 months.

## 2024-06-18 NOTE — Assessment & Plan Note (Signed)
 BP elevated today. Has been controlled on lisinopril  10 mg daily. Took dose right before visit today. I have asked her to keep BP log. If BP remains elevated at next visit will adjust lisinopril  dose.

## 2024-06-18 NOTE — Patient Instructions (Signed)
 Please start bupropion  150mg  daily, can take in the morning if makes you feel less sleepy  Please check [facility] for therapy options  I have made a referral to nutrition to discuss diet  Please increased regular physical activity with biking and walking You can look in to water aerobics if exercise is causing back pain  Look in to CBT-I Coach app to help with sleeping

## 2024-06-18 NOTE — Progress Notes (Signed)
 Established Patient Office Visit  Subjective   Patient ID: Cindy Pena, female    DOB: 1971-06-16  Age: 53 y.o. MRN: [id number]  Chief Complaint  Patient presents with   Anxiety    Getting worse, happening daily    Cindy Pena presents today for follow up of anxiety. Feels this has been worsening lately. Has anxiety every day, feels stressed about her weight. Has panic attacks, focusing on breathing helps, takes klonopin  when breathing exercises doe not work. Has 15 tabs left from last fill on 5/21. Has anxious thought at night making it difficult for her to fall asleep if she wakes up at night. Generally fees more restless and fidgety. Previously on SSRI but discontinued due to weight gain. 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   Feels weight is increasing. She is meal preps and eats low carb diet. Bikes about once a week but does not get much exercise. Previously saw a weight loss specialist on phentermine  with some weight loss but then regain the weight after insurance stopped covering for visits. Has also tried compounded semaglutide  but developed severe abdominal pain with concern for pancreatitis.    Patient Active Problem List   Diagnosis Date Noted   BMI 40.0-44.9, adult (HCC) 06/18/2024   Obesity, morbid (HCC) 01/26/2024   Palmar fascial fibromatosis (dupuytren) 01/26/2024   Generalized anxiety disorder 07/09/2021   Hepatic steatosis 02/20/2019   Mild hyperlipidemia 02/02/2019   Hypothyroidism due to acquired atrophy of thyroid 02/01/2019   Essential hypertension 05/21/2018   Hand eczema 10/20/2017   Class 2 obesity with body mass index (BMI) of 35 to 39.9 without comorbidity 04/19/2016   Gastroesophageal reflux disease without esophagitis 07/08/2015   PCOS (polycystic ovarian syndrome) 07/08/2015      ROS Refer to HPI    Objective:     BP (!) 144/86   Pulse 88   Ht 5' 3 (1.6  m)   Wt 237 lb (107.5 kg)   LMP  (LMP Unknown)   SpO2 99%   BMI 41.98 kg/m  BP Readings from Last 3 Encounters:  06/18/24 (!) 144/86  01/26/24 128/84  10/23/23 128/74    Physical Exam Constitutional:      Appearance: She is obese.  HENT:     Mouth/Throat:     Mouth: Mucous membranes are moist.     Pharynx: Oropharynx is clear.  Cardiovascular:     Rate and Rhythm: Normal rate and regular rhythm.  Pulmonary:     Effort: Pulmonary effort is normal.     Breath sounds: No rhonchi or rales.  Abdominal:     General: Abdomen is flat. Bowel sounds are normal. There is no distension.     Palpations: Abdomen is soft.     Tenderness: There is no abdominal tenderness.  Musculoskeletal:        General: Normal range of motion.     Right lower leg: No edema.     Left lower leg: No edema.  Skin:    General: Skin is warm and dry.     Capillary Refill: Capillary refill takes less than 2 seconds.  Neurological:     General: No focal deficit present.     Mental Status: She is alert and oriented to person, place, and time.  Psychiatric:        Mood and Affect: Mood normal.        Behavior: Behavior normal.  06/18/2024    9:14 AM 01/26/2024    8:42 AM 07/25/2023    3:33 PM  Depression screen PHQ 2/9  Decreased Interest 1 0 0  Down, Depressed, Hopeless 2 1 1   PHQ - 2 Score 3 1 1   Altered sleeping 3  3  Tired, decreased energy 2  1  Change in appetite 3  1  Feeling bad or failure about yourself  2  1  Trouble concentrating 0  0  Moving slowly or fidgety/restless 0  0  Suicidal thoughts 0  0  PHQ-9 Score 13  7  Difficult doing work/chores Somewhat difficult  Not difficult at all       06/18/2024    9:15 AM 01/26/2024    8:43 AM 07/25/2023    3:33 PM 04/25/2023    2:04 PM  GAD 7 : Generalized Anxiety Score  Nervous, Anxious, on Edge 3 2 2 3   Control/stop worrying 2 1 1 2   Worry too much - different things 1 1 1  0  Trouble relaxing 1 1 0 1  Restless 1 1 0 1  Easily  annoyed or irritable 0 3 0 0  Afraid - awful might happen 1 1 1 2   Total GAD 7 Score 9 10 5 9   Anxiety Difficulty Not difficult at all Not difficult at all Not difficult at all Not difficult at all    No results found for any visits on 06/18/24.  Last CBC Lab Results  Component Value Date   WBC 7.2 01/26/2024   HGB 13.9 01/26/2024   HCT 42.0 01/26/2024   MCV 86 01/26/2024   MCH 28.4 01/26/2024   RDW 12.9 01/26/2024   PLT 260 01/26/2024   Last metabolic panel Lab Results  Component Value Date   GLUCOSE 97 01/26/2024   NA 143 01/26/2024   K 5.3 (H) 01/26/2024   CL 103 01/26/2024   CO2 23 01/26/2024   BUN 19 01/26/2024   CREATININE 0.97 01/26/2024   EGFR 70 01/26/2024   CALCIUM 9.8 01/26/2024   PROT 6.9 01/26/2024   ALBUMIN 4.5 01/26/2024   LABGLOB 2.4 01/26/2024   AGRATIO 2.0 01/20/2023   BILITOT 0.4 01/26/2024   ALKPHOS 117 01/26/2024   AST 26 01/26/2024   ALT 28 01/26/2024   ANIONGAP 12 12/26/2019   Last lipids Lab Results  Component Value Date   CHOL 226 (H) 01/26/2024   HDL 49 01/26/2024   LDLCALC 143 (H) 01/26/2024   TRIG 189 (H) 01/26/2024   CHOLHDL 4.6 (H) 01/26/2024      The 10-year ASCVD risk score (Arnett DK, et al., 2019) is: 3.9%    Assessment & Plan:  Generalized anxiety disorder Assessment & Plan: GAD and PHQ9 scores are significantly higher than previous.  Does not want to try SSRI due to weight gain concerns. Would like to try bupropion  as she thinks it helped with mood and having no issues with constipation since she is taking miralax daily and is on linzess . Start bupropion  XL 150 mg daily. She plans to find a therapist. CBT-I coach for difficulty sleeping. Continue clonazepam  as needed. Follow up in 2 months.    Obesity, morbid (HCC) Assessment & Plan: Weight today is 237 lbs, gain 10 pounds since last visit in February.  Is fairly sedentary. Goal to increase biking to twice a week and walk about 3 days a week. Referral made to RD to  discuss dietary questions. She is working on reducing snacking especially sweets.   Orders: -  Referral to Nutrition and Diabetes Services  Essential hypertension Assessment & Plan: BP elevated today. Has been controlled on lisinopril  10 mg daily. Took dose right before visit today. I have asked her to keep BP log. If BP remains elevated at next visit will adjust lisinopril  dose.    BMI 40.0-44.9, adult (HCC)  Other orders -     buPROPion  HCl ER (XL); Take 1 tablet (150 mg total) by mouth daily.  Dispense: 30 tablet; Refill: 2     Return in about 2 months (around 08/19/2024) for anxiety.    Harlene Saddler, MD

## 2024-06-24 ENCOUNTER — Other Ambulatory Visit: Payer: Self-pay | Admitting: Internal Medicine

## 2024-06-24 DIAGNOSIS — E282 Polycystic ovarian syndrome: Secondary | ICD-10-CM

## 2024-06-24 DIAGNOSIS — I10 Essential (primary) hypertension: Secondary | ICD-10-CM

## 2024-06-25 ENCOUNTER — Encounter: Payer: Self-pay | Admitting: Student

## 2024-06-25 DIAGNOSIS — E282 Polycystic ovarian syndrome: Secondary | ICD-10-CM

## 2024-06-25 MED ORDER — METFORMIN HCL 500 MG PO TABS: 500.0000 mg | ORAL_TABLET | Freq: Two times a day (BID) | ORAL | 1 refills | Status: DC

## 2024-06-25 NOTE — Telephone Encounter (Signed)
 Too soon for refill.  Requested Prescriptions  Pending Prescriptions Disp Refills   lisinopril  (ZESTRIL ) 10 MG tablet [Pharmacy Med Name: LISINOPRIL  10MG  TABLETS] 90 tablet 1    Sig: TAKE 1 TABLET(10 MG) BY MOUTH DAILY     Cardiovascular:  ACE Inhibitors Failed - 06/25/2024  2:57 PM      Failed - K in normal range and within 180 days    Potassium  Date Value Ref Range Status  01/26/2024 5.3 (H) 3.5 - 5.2 mmol/L Final         Failed - Last BP in normal range    BP Readings from Last 1 Encounters:  06/18/24 (!) 144/86         Passed - Cr in normal range and within 180 days    Creatinine, Ser  Date Value Ref Range Status  01/26/2024 0.97 0.57 - 1.00 mg/dL Final         Passed - Patient is not pregnant      Passed - Valid encounter within last 6 months    Recent Outpatient Visits           1 week ago Generalized anxiety disorder   [facility], Har[place], MD   5 months ago Annual physical exam   [place] [facility] at [facility] [place], Leita DEL, MD               metFORMIN  (GLUCOPHAGE ) 500 MG tablet [Pharmacy Med Name: METFORMIN  500MG  TABLETS] 180 tablet 1    Sig: TAKE 1 TABLET BY MOUTH TWICE DAILY     Endocrinology:  Diabetes - Biguanides Failed - 06/25/2024  2:57 PM      Failed - B12 Level in normal range and within 720 days    No results found for: VITAMINB12       Passed - Cr in normal range and within 360 days    Creatinine, Ser  Date Value Ref Range Status  01/26/2024 0.97 0.57 - 1.00 mg/dL Final         Passed - HBA1C is between 0 and 7.9 and within 180 days    Hgb A1c MFr Bld  Date Value Ref Range Status  01/26/2024 5.4 4.8 - 5.6 % Final    Comment:             Prediabetes: 5.7 - 6.4          Diabetes: >6.4          Glycemic control for adults with diabetes: <7.0          Passed - eGFR in normal range and within 360 days    GFR calc Af Amer  Date Value Ref  Range Status  02/07/2020 87 >59 mL/min/1.73 Final   GFR calc non Af Amer  Date Value Ref Range Status  02/07/2020 75 >59 mL/min/1.73 Final   eGFR  Date Value Ref Range Status  01/26/2024 70 >59 mL/min/1.73 Final         Passed - Valid encounter within last 6 months    Recent Outpatient Visits           1 week ago Generalized anxiety disorder   [place] [facility], MD   5 months ago Annual physical exam   [facility] at [facility], Leita DEL, MD  Passed - CBC within normal limits and completed in the last 12 months    WBC  Date Value Ref Range Status  01/26/2024 7.2 3.4 - 10.8 x10E3/uL Final  12/26/2019 7.0 4.0 - 10.5 K/uL Final   RBC  Date Value Ref Range Status  01/26/2024 4.90 3.77 - 5.28 x10E6/uL Final  12/26/2019 5.37 (H) 3.87 - 5.11 MIL/uL Final   Hemoglobin  Date Value Ref Range Status  01/26/2024 13.9 11.1 - 15.9 g/dL Final   Hematocrit  Date Value Ref Range Status  01/26/2024 42.0 34.0 - 46.6 % Final   MCHC  Date Value Ref Range Status  01/26/2024 33.1 31.5 - 35.7 g/dL Final  98/85/7978 66.9 30.0 - 36.0 g/dL Final   [facility]  Date Value Ref Range Status  01/26/2024 28.4 26.6 - 33.0 pg Final  12/26/2019 27.2 26.0 - 34.0 pg Final   MCV  Date Value Ref Range Status  01/26/2024 86 79 - 97 fL Final   No results found for: PLTCOUNTKUC, LABPLAT, POCPLA RDW  Date Value Ref Range Status  01/26/2024 12.9 11.7 - 15.4 % Final

## 2024-07-04 ENCOUNTER — Encounter: Payer: Self-pay | Admitting: Student

## 2024-07-04 NOTE — Telephone Encounter (Signed)
 Please review.  KP

## 2024-07-04 NOTE — Telephone Encounter (Signed)
Please review patients response.

## 2024-07-11 ENCOUNTER — Ambulatory Visit: Payer: BC Managed Care – PPO | Admitting: Internal Medicine

## 2024-07-18 ENCOUNTER — Other Ambulatory Visit: Payer: Self-pay | Admitting: Internal Medicine

## 2024-07-18 DIAGNOSIS — F411 Generalized anxiety disorder: Secondary | ICD-10-CM

## 2024-07-20 NOTE — Telephone Encounter (Signed)
 Requested medication (s) are due for refill today: Yes  Requested medication (s) are on the active medication list: Yes  Last refill:  02/01/24  Future visit scheduled: No  Notes to clinic:  Unable to refill per protocol, cannot delegate.     Requested Prescriptions  Pending Prescriptions Disp Refills   clonazePAM  (KLONOPIN ) 0.5 MG tablet [Pharmacy Med Name: CLONAZEPAM  0.5MG  TABLETS] 30 tablet     Sig: TAKE 1 TABLET(0.5 MG) BY MOUTH TWICE DAILY AS NEEDED FOR ANXIETY     Not Delegated - Psychiatry: Anxiolytics/Hypnotics 2 Failed - 07/20/2024  9:43 PM      Failed - This refill cannot be delegated      Failed - Urine Drug Screen completed in last 360 days      Passed - Patient is not pregnant      Passed - Valid encounter within last 6 months    Recent Outpatient Visits           1 month ago Generalized anxiety disorder   [place] [facility], MD   5 months ago Annual physical exam   [facility] at [facility], Leita DEL, MD

## 2024-07-21 ENCOUNTER — Encounter: Payer: Self-pay | Admitting: Internal Medicine

## 2024-07-22 NOTE — Telephone Encounter (Signed)
 Please review medication refill request

## 2024-07-25 ENCOUNTER — Telehealth: Admitting: Emergency Medicine

## 2024-07-25 DIAGNOSIS — M546 Pain in thoracic spine: Secondary | ICD-10-CM

## 2024-07-25 MED ORDER — TIZANIDINE HCL 2 MG PO CAPS: 2.0000 mg | ORAL_CAPSULE | Freq: Three times a day (TID) | ORAL | 0 refills | Status: DC

## 2024-07-25 MED ORDER — NAPROXEN 500 MG PO TABS: 500.0000 mg | ORAL_TABLET | Freq: Two times a day (BID) | ORAL | 0 refills | Status: DC

## 2024-07-25 NOTE — Progress Notes (Signed)
 E-Visit for Back Pain   We are sorry that you are not feeling well.  Here is how we plan to help!  Based on what you have shared with me it looks like you mostly have acute back pain.  Acute back pain is defined as musculoskeletal pain that can resolve in 1-3 weeks with conservative treatment.  If you are not getting better, I recommend you follow up with a Sports Medicine clinic.   I have prescribed Naprosyn  500 mg take one by mouth twice a day non-steroid anti-inflammatory (NSAID) as well as Tizanidine  2 mg every eight hours as needed which is a muscle relaxer  Some patients experience stomach irritation or in increased heartburn with anti-inflammatory drugs.  Please keep in mind that muscle relaxer's can cause fatigue and should not be taken while at work or driving.  Back pain is very common.  The pain often gets better over time.  The cause of back pain is usually not dangerous.  Most people can learn to manage their back pain on their own.  Home Care Stay active.  Start with short walks on flat ground if you can.  Try to walk farther each day. Do not sit, drive or stand in one place for more than 30 minutes.  Do not stay in bed. Do not avoid exercise or work.  Activity can help your back heal faster. Be careful when you bend or lift an object.  Bend at your knees, keep the object close to you, and do not twist. Sleep on a firm mattress.  Lie on your side, and bend your knees.  If you lie on your back, put a pillow under your knees. Only take medicines as told by your doctor. Put ice on the injured area. Put ice in a plastic bag Place a towel between your skin and the bag Leave the ice on for 15-20 minutes, 3-4 times a day for the first 2-3 days. 210 After that, you can switch between ice and heat packs. Ask your doctor about back exercises or massage. Avoid feeling anxious or stressed.  Find good ways to deal with stress, such as exercise.  Get Help Right Way If: Your pain does not  go away with rest or medicine. Your pain does not go away in 1 week. You have new problems. You do not feel well. The pain spreads into your legs. You cannot control when you poop (bowel movement) or pee (urinate) You feel sick to your stomach (nauseous) or throw up (vomit) You have belly (abdominal) pain. You feel like you may pass out (faint). If you develop a fever.  Make Sure you: Understand these instructions. Will watch your condition Will get help right away if you are not doing well or get worse.  Your e-visit answers were reviewed by a board certified advanced clinical practitioner to complete your personal care plan.  Depending on the condition, your plan could have included both over the counter or prescription medications.  If there is a problem please reply  once you have received a response from your provider.  Your safety is important to us .  If you have drug allergies check your prescription carefully.    You can use MyChart to ask questions about today's visit, request a non-urgent call back, or ask for a work or school excuse for 24 hours related to this e-Visit. If it has been greater than 24 hours you will need to follow up with your provider, or enter a new  e-Visit to address those concerns.  You will get an e-mail in the next two days asking about your experience.  I hope that your e-visit has been valuable and will speed your recovery. Thank you for using e-visits.  I have spent 5 minutes in review of e-visit questionnaire, review and updating patient chart, medical decision making and response to patient.   Jon Belt, PhD, FNP-BC

## 2024-07-31 ENCOUNTER — Encounter: Payer: Self-pay | Admitting: Internal Medicine

## 2024-08-01 ENCOUNTER — Ambulatory Visit: Admitting: Internal Medicine

## 2024-08-01 ENCOUNTER — Ambulatory Visit: Payer: Self-pay | Admitting: *Deleted

## 2024-08-01 ENCOUNTER — Ambulatory Visit (INDEPENDENT_AMBULATORY_CARE_PROVIDER_SITE_OTHER): Admitting: Student

## 2024-08-01 ENCOUNTER — Encounter: Payer: Self-pay | Admitting: Student

## 2024-08-01 VITALS — BP 156/88 | HR 77 | Ht 63.0 in | Wt 236.4 lb

## 2024-08-01 DIAGNOSIS — I10 Essential (primary) hypertension: Secondary | ICD-10-CM | POA: Diagnosis not present

## 2024-08-01 DIAGNOSIS — E034 Atrophy of thyroid (acquired): Secondary | ICD-10-CM

## 2024-08-01 DIAGNOSIS — F411 Generalized anxiety disorder: Secondary | ICD-10-CM | POA: Diagnosis not present

## 2024-08-01 MED ORDER — LISINOPRIL 20 MG PO TABS: ORAL_TABLET | ORAL | 1 refills | Status: DC

## 2024-08-01 NOTE — Telephone Encounter (Signed)
 Please review

## 2024-08-01 NOTE — Assessment & Plan Note (Signed)
 BP elevated at last visit and again today. Home pressures for the last few days have also been similar to readings in office today. Does take NSAIDs as needed but no significant change in use or recent use in the last day. Does have some associated headache but other wise feels well.  -Increase lisinopril  20 mg daily -She will working reducing salt intake and DASH diet -Follow up in 2 weeks.

## 2024-08-01 NOTE — Telephone Encounter (Signed)
Patient is scheduled this morning

## 2024-08-01 NOTE — Assessment & Plan Note (Signed)
 Reports sweating more lately, denies heat and cold intolerance, diarrhea, blurred vision, abdominal pain or diarrhea. TSH + T4.

## 2024-08-01 NOTE — Progress Notes (Signed)
 Established Patient Office Visit  Subjective   Patient ID: Cindy Pena, female    DOB: 18-Sep-1971  Age: 53 y.o. MRN: [id number]  Chief Complaint  Patient presents with   Hypertension    Tawni Glimpse with medical hx listed below presents today for elevated BP. She has history of HTN currently on lisinopril  10 mg daily. Notes home blood pressures have been elevated in the last week after she noticed some headaches. Has moving boxes and moving things around the office and notices she was feeling more headache and nausea after that. Symptoms improved over the night. This morning had a slight headaches, mostly around the temples. Home pressures for the last have been 150-160 over 80-90. About 2 weeks ago was seen for back pain and took naproxen  and muscle relaxer for a few days. She denies fever, chills, CP, dyspnea, abdominal pain, vomiting, lightlessness, blurred vision, dizziness, sensory changes, weakness. Occasionally takes NSAIDs as needed but has not used any in the last week.   Patient Active Problem List   Diagnosis Date Noted   BMI 40.0-44.9, adult (HCC) 06/18/2024   Obesity, morbid (HCC) 01/26/2024   Palmar fascial fibromatosis (dupuytren) 01/26/2024   Generalized anxiety disorder 07/09/2021   Hepatic steatosis 02/20/2019   Mild hyperlipidemia 02/02/2019   Hypothyroidism due to acquired atrophy of thyroid 02/01/2019   Essential hypertension 05/21/2018   Hand eczema 10/20/2017   Class 2 obesity with body mass index (BMI) of 35 to 39.9 without comorbidity 04/19/2016   Gastroesophageal reflux disease without esophagitis 07/08/2015   PCOS (polycystic ovarian syndrome) 07/08/2015      ROS Refer to HPI    Objective:     Outpatient Encounter Medications as of 08/01/2024  Medication Sig   clonazePAM  (KLONOPIN ) 0.5 MG tablet TAKE 1 TABLET(0.5 MG) BY MOUTH TWICE DAILY AS NEEDED FOR ANXIETY   docusate sodium (COLACE) 100 MG capsule Take 100 mg by mouth 2 (two)  times daily.   levothyroxine  (SYNTHROID ) 112 MCG tablet TAKE 1 TABLET(112 MCG) BY MOUTH DAILY BEFORE BREAKFAST   LINZESS  290 MCG CAPS capsule TAKE 1 CAPSULE(290 MCG) BY MOUTH DAILY BEFORE BREAKFAST   metFORMIN  (GLUCOPHAGE ) 500 MG tablet Take 1 tablet (500 mg total) by mouth 2 (two) times daily with a meal. TAKE 1 TABLET(500 MG) BY MOUTH DAILY WITH BREAKFAST   Multiple Vitamins-Minerals (ONE-A-DAY MENOPAUSE FORMULA PO) Take by mouth daily.   omeprazole  (PRILOSEC) 20 MG capsule TAKE 1 CAPSULE BY MOUTH DAILY   tizanidine  (ZANAFLEX ) 2 MG capsule Take 1 capsule (2 mg total) by mouth 3 (three) times daily.   [DISCONTINUED] lisinopril  (ZESTRIL ) 10 MG tablet TAKE 1 TABLET(10 MG) BY MOUTH DAILY   [DISCONTINUED] naproxen  (NAPROSYN ) 500 MG tablet Take 1 tablet (500 mg total) by mouth 2 (two) times daily with a meal.   lisinopril  (ZESTRIL ) 20 MG tablet TAKE 1 TABLET(10 MG) BY MOUTH DAILY   [DISCONTINUED] buPROPion  (WELLBUTRIN  XL) 150 MG 24 hr tablet Take 1 tablet (150 mg total) by mouth daily.   [DISCONTINUED] cimetidine (TAGAMET) 200 MG tablet Take 200 mg by mouth 2 (two) times daily.   [DISCONTINUED] metoprolol  tartrate (LOPRESSOR ) 25 MG tablet Take 0.5 tablets (12.5 mg total) by mouth daily as needed.   No facility-administered encounter medications on file as of 08/01/2024.    BP (!) 156/88   Pulse 77   Ht 5' 3 (1.6 m)   Wt 236 lb 6 oz (107.2 kg)   LMP  (LMP Unknown)   SpO2 98%   BMI 41.87  kg/m  BP Readings from Last 3 Encounters:  08/01/24 (!) 156/88  06/18/24 (!) 144/86  01/26/24 128/84    Physical Exam Constitutional:      Appearance: Normal appearance.  HENT:     Head: Normocephalic and atraumatic.  Cardiovascular:     Rate and Rhythm: Normal rate and regular rhythm.     Pulses: Normal pulses.     Heart sounds: No murmur heard. Pulmonary:     Effort: Pulmonary effort is normal.     Breath sounds: No rhonchi or rales.  Abdominal:     General: Abdomen is flat. Bowel sounds are  normal. There is no distension.     Palpations: Abdomen is soft.     Tenderness: There is no abdominal tenderness.  Musculoskeletal:        General: Normal range of motion.     Right lower leg: No edema.     Left lower leg: No edema.  Skin:    General: Skin is warm and dry.     Capillary Refill: Capillary refill takes less than 2 seconds.  Neurological:     General: No focal deficit present.     Mental Status: She is alert and oriented to person, place, and time.  Psychiatric:        Mood and Affect: Mood normal.        Behavior: Behavior normal.        08/01/2024   10:59 AM 06/18/2024    9:14 AM 01/26/2024    8:42 AM  Depression screen PHQ 2/9  Decreased Interest 0 1 0  Down, Depressed, Hopeless 2 2 1   PHQ - 2 Score 2 3 1   Altered sleeping 2 3   Tired, decreased energy 1 2   Change in appetite 2 3   Feeling bad or failure about yourself  2 2   Trouble concentrating 0 0   Moving slowly or fidgety/restless 0 0   Suicidal thoughts 0 0   PHQ-9 Score 9 13   Difficult doing work/chores Not difficult at all Somewhat difficult        08/01/2024   10:59 AM 06/18/2024    9:15 AM 01/26/2024    8:43 AM 07/25/2023    3:33 PM  GAD 7 : Generalized Anxiety Score  Nervous, Anxious, on Edge 2 3 2 2   Control/stop worrying 1 2 1 1   Worry too much - different things 0 1 1 1   Trouble relaxing 2 1 1  0  Restless 0 1 1 0  Easily annoyed or irritable 0 0 3 0  Afraid - awful might happen 1 1 1 1   Total GAD 7 Score 6 9 10 5   Anxiety Difficulty Not difficult at all Not difficult at all Not difficult at all Not difficult at all    No results found for any visits on 08/01/24.    The 10-year ASCVD risk score (Arnett DK, et al., 2019) is: 4.6%    Assessment & Plan:  Generalized anxiety disorder Assessment & Plan: Stopped bupropion  due to constipation about 1 month ago. Taking clonazepam  as needed but uses this rarely. Taking about 30 tablets every 3 months.    Essential  hypertension Assessment & Plan: BP elevated at last visit and again today. Home pressures for the last few days have also been similar to readings in office today. Does take NSAIDs as needed but no significant change in use or recent use in the last day. Does have some associated headache but other wise  feels well.  -Increase lisinopril  20 mg daily -She will working reducing salt intake and DASH diet -Follow up in 2 weeks.   Orders: -     Lisinopril ; TAKE 1 TABLET(10 MG) BY MOUTH DAILY  Dispense: 90 tablet; Refill: 1  Hypothyroidism due to acquired atrophy of thyroid Assessment & Plan: Reports sweating more lately, denies heat and cold intolerance, diarrhea, blurred vision, abdominal pain or diarrhea. TSH + T4.   Orders: -     TSH + free T4     Return in about 2 weeks (around 08/15/2024) for HTN.    Harlene Saddler, MD

## 2024-08-01 NOTE — Telephone Encounter (Signed)
 FYI Only or Action Required?: FYI only for provider.  Patient was last seen in primary care on 06/18/2024 by Lemon Raisin, MD.  Called Nurse Triage reporting Hypertension.  Symptoms began several days ago.  Interventions attempted: Prescription medications: lisinopril .  Symptoms are: gradually worsening.  Triage Disposition: See PCP When Office is Open (Within 3 Days)  Patient/caregiver understands and will follow disposition?: Yes   Copied from CRM #[id number]. Topic: Clinical - Red Word Triage >> Aug 01, 2024  8:56 AM Willma SAUNDERS wrote: [facility] that prompted transfer to Nurse Triage: Patient states her BP has been running. Took it when she got to work and was 163/97 and just took again and was 158/90. Also states she has a headache. Reason for Disposition  Systolic BP >= 160 OR Diastolic >= 100  Answer Assessment - Initial Assessment Questions 1. BLOOD PRESSURE: What is your blood pressure? Did you take at least two measurements 5 minutes apart?     163/97- toady, 158/94 2. ONSET: When did you take your blood pressure?     Today 3. HOW: How did you take your blood pressure? (e.g., automatic home BP monitor, visiting nurse)     Automatic cuff- arm 4. HISTORY: Do you have a history of high blood pressure?     yes 5. MEDICINES: Are you taking any medicines for blood pressure? Have you missed any doses recently?     Yes- lisinopril  10mg , no recent change 6. OTHER SYMPTOMS: Do you have any symptoms? (e.g., blurred vision, chest pain, difficulty breathing, headache, weakness)     Headache- yesterday  Protocols used: Blood Pressure - High-A-AH

## 2024-08-01 NOTE — Assessment & Plan Note (Addendum)
 Stopped bupropion  due to constipation about 1 month ago. Taking clonazepam  as needed but uses this rarely. Taking about 30 tablets every 3 months.

## 2024-08-02 ENCOUNTER — Ambulatory Visit: Payer: Self-pay | Admitting: Student

## 2024-08-02 LAB — TSH+FREE T4
Free T4: 1.46 ng/dL (ref 0.82–1.77)
TSH: 4.24 u[IU]/mL (ref 0.450–4.500)

## 2024-08-02 NOTE — Telephone Encounter (Signed)
 Please review and advise patient.   JM

## 2024-08-05 ENCOUNTER — Ambulatory Visit: Admitting: Dietician

## 2024-08-09 ENCOUNTER — Encounter: Payer: Self-pay | Admitting: Student

## 2024-08-09 NOTE — Telephone Encounter (Signed)
 Can you please assist with this patient's message since Dr. Lemon is out of the office today.

## 2024-08-13 ENCOUNTER — Other Ambulatory Visit: Payer: Self-pay | Admitting: Medical Genetics

## 2024-08-13 ENCOUNTER — Other Ambulatory Visit: Payer: Self-pay | Admitting: Student

## 2024-08-13 MED ORDER — HYDROCHLOROTHIAZIDE 12.5 MG PO CAPS: 12.5000 mg | ORAL_CAPSULE | Freq: Every day | ORAL | 1 refills | Status: DC

## 2024-08-13 NOTE — Telephone Encounter (Signed)
 Please review.  KP

## 2024-08-17 ENCOUNTER — Other Ambulatory Visit: Payer: Self-pay

## 2024-08-19 ENCOUNTER — Encounter: Payer: Self-pay | Admitting: Student

## 2024-08-20 ENCOUNTER — Ambulatory Visit: Admitting: Student

## 2024-08-21 ENCOUNTER — Encounter: Admitting: Student

## 2024-08-23 ENCOUNTER — Encounter: Payer: Self-pay | Admitting: Student

## 2024-08-23 NOTE — Telephone Encounter (Signed)
 Please review.  KP

## 2024-08-27 ENCOUNTER — Ambulatory Visit (INDEPENDENT_AMBULATORY_CARE_PROVIDER_SITE_OTHER): Admitting: Student

## 2024-08-27 ENCOUNTER — Encounter: Payer: Self-pay | Admitting: Student

## 2024-08-27 VITALS — BP 138/74 | HR 85 | Ht 63.0 in | Wt 237.0 lb

## 2024-08-27 DIAGNOSIS — G8929 Other chronic pain: Secondary | ICD-10-CM | POA: Diagnosis not present

## 2024-08-27 DIAGNOSIS — E034 Atrophy of thyroid (acquired): Secondary | ICD-10-CM | POA: Diagnosis not present

## 2024-08-27 DIAGNOSIS — M545 Low back pain, unspecified: Secondary | ICD-10-CM

## 2024-08-27 DIAGNOSIS — I10 Essential (primary) hypertension: Secondary | ICD-10-CM | POA: Diagnosis not present

## 2024-08-27 MED ORDER — LEVOTHYROXINE SODIUM 112 MCG PO TABS: 112.0000 ug | ORAL_TABLET | Freq: Every day | ORAL | 1 refills | Status: AC

## 2024-08-27 NOTE — Assessment & Plan Note (Signed)
 BP improved  to 138/74 on hydrochlorothiazide  12.5 mg daily and lisinopril  20 mg. Did not tolerate higher dose of lisinopril  40 mg daily. BMP today. Continue current medications.

## 2024-08-28 ENCOUNTER — Ambulatory Visit: Payer: Self-pay | Admitting: Student

## 2024-08-28 ENCOUNTER — Encounter: Payer: Self-pay | Admitting: Student

## 2024-08-28 DIAGNOSIS — G8929 Other chronic pain: Secondary | ICD-10-CM | POA: Insufficient documentation

## 2024-08-28 LAB — BASIC METABOLIC PANEL WITH GFR
BUN/Creatinine Ratio: 23 (ref 9–23)
BUN: 19 mg/dL (ref 6–24)
CO2: 26 mmol/L (ref 20–29)
Calcium: 9.8 mg/dL (ref 8.7–10.2)
Chloride: 99 mmol/L (ref 96–106)
Creatinine, Ser: 0.83 mg/dL (ref 0.57–1.00)
Glucose: 99 mg/dL (ref 70–99)
Potassium: 4.7 mmol/L (ref 3.5–5.2)
Sodium: 140 mmol/L (ref 134–144)
eGFR: 84 mL/min/1.73 (ref >59)

## 2024-08-28 NOTE — Assessment & Plan Note (Signed)
 Intermittent right low back pain that is worse with standing and activity. Pain is achy and improves with rest. Denies radicular symptoms, sensory/strength change, or trauma. Improves with back stretches and naproxen . Recommend back exercises and NSAID on high activity days. Offered PT but she declined at this time.

## 2024-08-28 NOTE — Telephone Encounter (Signed)
 Please review patient's message:

## 2024-08-28 NOTE — Assessment & Plan Note (Signed)
 TSH is stable in August refilled levothyroxine  112 mg daily.

## 2024-08-28 NOTE — Progress Notes (Signed)
 Established Patient Office Visit  Subjective   Patient ID: Cindy Pena, female    DOB: Jul 11, 1971  Age: 53 y.o. MRN: [id number]  Chief Complaint  Patient presents with   Hypertension    Tawni Glimpse with medical hx listed below presents today for follow up of hypertension. Feeling well today, tolerating hydrochlorothiazide  well not having dizziness or loc. Reports home BP have been improving.  Patient Active Problem List   Diagnosis Date Noted   Chronic back pain 08/28/2024   BMI 40.0-44.9, adult (HCC) 06/18/2024   Obesity, morbid (HCC) 01/26/2024   Palmar fascial fibromatosis (dupuytren) 01/26/2024   Generalized anxiety disorder 07/09/2021   Hepatic steatosis 02/20/2019   Mild hyperlipidemia 02/02/2019   Hypothyroidism due to acquired atrophy of thyroid 02/01/2019   Essential hypertension 05/21/2018   Hand eczema 10/20/2017   Class 2 obesity with body mass index (BMI) of 35 to 39.9 without comorbidity 04/19/2016   Gastroesophageal reflux disease without esophagitis 07/08/2015   PCOS (polycystic ovarian syndrome) 07/08/2015      ROS Refer to HPI    Objective:     Outpatient Encounter Medications as of 08/27/2024  Medication Sig   clonazePAM  (KLONOPIN ) 0.5 MG tablet TAKE 1 TABLET(0.5 MG) BY MOUTH TWICE DAILY AS NEEDED FOR ANXIETY   docusate sodium (COLACE) 100 MG capsule Take 100 mg by mouth 2 (two) times daily.   hydrochlorothiazide  (MICROZIDE ) 12.5 MG capsule Take 1 capsule (12.5 mg total) by mouth daily.   LINZESS  290 MCG CAPS capsule TAKE 1 CAPSULE(290 MCG) BY MOUTH DAILY BEFORE BREAKFAST   lisinopril  (ZESTRIL ) 20 MG tablet TAKE 1 TABLET(10 MG) BY MOUTH DAILY   metFORMIN  (GLUCOPHAGE ) 500 MG tablet Take 1 tablet (500 mg total) by mouth 2 (two) times daily with a meal. TAKE 1 TABLET(500 MG) BY MOUTH DAILY WITH BREAKFAST   Multiple Vitamins-Minerals (ONE-A-DAY MENOPAUSE FORMULA PO) Take by mouth daily.   omeprazole  (PRILOSEC) 20 MG capsule TAKE 1  CAPSULE BY MOUTH DAILY   [DISCONTINUED] levothyroxine  (SYNTHROID ) 112 MCG tablet TAKE 1 TABLET(112 MCG) BY MOUTH DAILY BEFORE BREAKFAST   levothyroxine  (SYNTHROID ) 112 MCG tablet Take 1 tablet (112 mcg total) by mouth daily before breakfast.   [DISCONTINUED] cimetidine (TAGAMET) 200 MG tablet Take 200 mg by mouth 2 (two) times daily.   [DISCONTINUED] metoprolol  tartrate (LOPRESSOR ) 25 MG tablet Take 0.5 tablets (12.5 mg total) by mouth daily as needed.   [DISCONTINUED] tizanidine  (ZANAFLEX ) 2 MG capsule Take 1 capsule (2 mg total) by mouth 3 (three) times daily.   No facility-administered encounter medications on file as of 08/27/2024.    BP 138/74   Pulse 85   Ht 5' 3 (1.6 m)   Wt 237 lb (107.5 kg)   LMP  (LMP Unknown)   SpO2 97%   BMI 41.98 kg/m  BP Readings from Last 3 Encounters:  08/27/24 138/74  08/01/24 (!) 156/88  06/18/24 (!) 144/86    Physical Exam Constitutional:      Appearance: Normal appearance.  HENT:     Head: Normocephalic and atraumatic.  Eyes:     Extraocular Movements: Extraocular movements intact.     Conjunctiva/sclera: Conjunctivae normal.     Pupils: Pupils are equal, round, and reactive to light.  Cardiovascular:     Rate and Rhythm: Normal rate and regular rhythm.     Pulses: Normal pulses.     Heart sounds: No murmur heard. Pulmonary:     Effort: Pulmonary effort is normal.     Breath sounds: No rhonchi  or rales.  Abdominal:     General: Abdomen is flat. Bowel sounds are normal. There is no distension.     Palpations: Abdomen is soft.     Tenderness: There is no abdominal tenderness.  Musculoskeletal:        General: Normal range of motion.     Right lower leg: No edema.     Left lower leg: No edema.  Skin:    General: Skin is warm and dry.     Capillary Refill: Capillary refill takes less than 2 seconds.  Neurological:     General: No focal deficit present.     Mental Status: She is alert and oriented to person, place, and time.   Psychiatric:        Mood and Affect: Mood normal.        Behavior: Behavior normal.        08/01/2024   10:59 AM 06/18/2024    9:14 AM 01/26/2024    8:42 AM  Depression screen PHQ 2/9  Decreased Interest 0 1 0  Down, Depressed, Hopeless 2 2 1   PHQ - 2 Score 2 3 1   Altered sleeping 2 3   Tired, decreased energy 1 2   Change in appetite 2 3   Feeling bad or failure about yourself  2 2   Trouble concentrating 0 0   Moving slowly or fidgety/restless 0 0   Suicidal thoughts 0 0   PHQ-9 Score 9 13   Difficult doing work/chores Not difficult at all Somewhat difficult        08/01/2024   10:59 AM 06/18/2024    9:15 AM 01/26/2024    8:43 AM 07/25/2023    3:33 PM  GAD 7 : Generalized Anxiety Score  Nervous, Anxious, on Edge 2 3 2 2   Control/stop worrying 1 2 1 1   Worry too much - different things 0 1 1 1   Trouble relaxing 2 1 1  0  Restless 0 1 1 0  Easily annoyed or irritable 0 0 3 0  Afraid - awful might happen 1 1 1 1   Total GAD 7 Score 6 9 10 5   Anxiety Difficulty Not difficult at all Not difficult at all Not difficult at all Not difficult at all    Results for orders placed or performed in visit on 08/27/24  Basic metabolic panel with GFR  Result Value Ref Range   Glucose 99 70 - 99 mg/dL   BUN 19 6 - 24 mg/dL   Creatinine, Ser 9.16 0.57 - 1.00 mg/dL   eGFR 84 >40 fO/fpw/8.26   BUN/Creatinine Ratio 23 9 - 23   Sodium 140 134 - 144 mmol/L   Potassium 4.7 3.5 - 5.2 mmol/L   Chloride 99 96 - 106 mmol/L   CO2 26 20 - 29 mmol/L   Calcium 9.8 8.7 - 10.2 mg/dL    Last CBC Lab Results  Component Value Date   WBC 7.2 01/26/2024   HGB 13.9 01/26/2024   HCT 42.0 01/26/2024   MCV 86 01/26/2024   MCH 28.4 01/26/2024   RDW 12.9 01/26/2024   PLT 260 01/26/2024   Last metabolic panel Lab Results  Component Value Date   GLUCOSE 99 08/27/2024   NA 140 08/27/2024   K 4.7 08/27/2024   CL 99 08/27/2024   CO2 26 08/27/2024   BUN 19 08/27/2024   CREATININE 0.83 08/27/2024    EGFR 84 08/27/2024   CALCIUM 9.8 08/27/2024   PROT 6.9 01/26/2024  ALBUMIN 4.5 01/26/2024   LABGLOB 2.4 01/26/2024   AGRATIO 2.0 01/20/2023   BILITOT 0.4 01/26/2024   ALKPHOS 117 01/26/2024   AST 26 01/26/2024   ALT 28 01/26/2024   ANIONGAP 12 12/26/2019   Last thyroid functions Lab Results  Component Value Date   TSH 4.240 08/01/2024      The 10-year ASCVD risk score (Arnett DK, et al., 2019) is: 3.6%    Assessment & Plan:  Essential hypertension Assessment & Plan: BP improved  to 138/74 on hydrochlorothiazide  12.5 mg daily and lisinopril  20 mg. Did not tolerate higher dose of lisinopril  40 mg daily. BMP today. Continue current medications.   Orders: -     Basic metabolic panel with GFR  Chronic right-sided low back pain without sciatica Assessment & Plan: Intermittent right low back pain that is worse with standing and activity. Pain is achy and improves with rest. Denies radicular symptoms, sensory/strength change, or trauma. Improves with back stretches and naproxen . Recommend back exercises and NSAID on high activity days. Offered PT but she declined at this time.    Hypothyroidism due to acquired atrophy of thyroid Assessment & Plan: TSH is stable in August refilled levothyroxine  112 mg daily.    Other orders -     Levothyroxine  Sodium; Take 1 tablet (112 mcg total) by mouth daily before breakfast.  Dispense: 90 tablet; Refill: 1     Return in about 6 months (around 02/24/2025) for physical.    Harlene Saddler, MD

## 2024-09-03 ENCOUNTER — Encounter: Payer: Self-pay | Admitting: Student

## 2024-09-03 ENCOUNTER — Ambulatory Visit (INDEPENDENT_AMBULATORY_CARE_PROVIDER_SITE_OTHER): Admitting: Student

## 2024-09-03 MED ORDER — TIRZEPATIDE-WEIGHT MANAGEMENT 2.5 MG/0.5ML ~~LOC~~ SOLN: 2.5000 mg | SUBCUTANEOUS | 1 refills | Status: DC

## 2024-09-03 NOTE — Assessment & Plan Note (Addendum)
 Her sister is on tirzepatide  and has been doing well. Is interested in trying this however is concerned due to sever abdominal pain on compounded semaglutide that lasted about and hour before resolved. Tried to lower dose but had similar episode of pain and discontinued this. Has had tried phentermine , naltrexone, and Wellbutrin .  Had initial efficacy with phentermine  but regained the waited after discontinuing, no efficacy with second course or other medications.   Her weight ranges from 220 to 230 since early 2024. Has considerable food cravings and food noise with dietary modification. Has been eating a low cab diet and recently started exercising on indoor bike for 10 minutes every other night, is gradually increasing amount of physical activity as tolerated, goal to get to 150 minutes/week. 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 Would like to trial tirzepatide . Discussed this may have more tolerable side effect profile and perhaps stay at lower dose to avoid  side effects. Her insurance does not cover weight loss medications outside of diabetes. She would like to try self pay option through [employer]. -Start tirzepatide  2.5 mg weekly, first month coupon given, she will call and stop medication if having severe abdominal pain again

## 2024-09-03 NOTE — Progress Notes (Signed)
 Established Patient Office Visit  Subjective   Patient ID: Cindy Pena, female    DOB: [birth year]  Age: 53 y.o. MRN: [id number]  Chief Complaint  Patient presents with   Obesity    Tawni Glimpse with medical hx listed below presents today for obesity. Has tried multiple weight loss medications and is interested in tripeptide and she has several family members on this.     ROS Refer to HPI    Objective:     Outpatient Encounter Medications as of 09/03/2024  Medication Sig   clonazePAM  (KLONOPIN ) 0.5 MG tablet TAKE 1 TABLET(0.5 MG) BY MOUTH TWICE DAILY AS NEEDED FOR ANXIETY   docusate sodium (COLACE) 100 MG capsule Take 100 mg by mouth 2 (two) times daily.   hydrochlorothiazide  (MICROZIDE ) 12.5 MG capsule Take 1 capsule (12.5 mg total) by mouth daily.   levothyroxine  (SYNTHROID ) 112 MCG tablet Take 1 tablet (112 mcg total) by mouth daily before breakfast.   LINZESS  290 MCG CAPS capsule TAKE 1 CAPSULE(290 MCG) BY MOUTH DAILY BEFORE BREAKFAST   lisinopril  (ZESTRIL ) 20 MG tablet TAKE 1 TABLET(10 MG) BY MOUTH DAILY   metFORMIN  (GLUCOPHAGE ) 500 MG tablet Take 1 tablet (500 mg total) by mouth 2 (two) times daily with a meal. TAKE 1 TABLET(500 MG) BY MOUTH DAILY WITH BREAKFAST   Multiple Vitamins-Minerals (ONE-A-DAY MENOPAUSE FORMULA PO) Take by mouth daily.   omeprazole  (PRILOSEC) 20 MG capsule TAKE 1 CAPSULE BY MOUTH DAILY   tirzepatide  (ZEPBOUND ) 2.5 MG/0.5ML injection vial Inject 2.5 mg into the skin once a week.   [DISCONTINUED] cimetidine (TAGAMET) 200 MG tablet Take 200 mg by mouth 2 (two) times daily.   [DISCONTINUED] metoprolol  tartrate (LOPRESSOR ) 25 MG tablet Take 0.5 tablets (12.5 mg total) by mouth daily as needed.   No facility-administered encounter medications on file as of 09/03/2024.    BP 118/76   Pulse 86   Ht 5' 3 (1.6 m)   Wt 226 lb (102.5 kg)   LMP  (LMP Unknown)   SpO2 98%   BMI 40.03 kg/m  BP Readings from Last 3 Encounters:   09/03/24 118/76  08/27/24 138/74  08/01/24 (!) 156/88    Physical Exam Constitutional:      Appearance: Normal appearance. She is obese.  HENT:     Head: Normocephalic and atraumatic.     Mouth/Throat:     Mouth: Mucous membranes are moist.     Pharynx: Oropharynx is clear.  Cardiovascular:     Rate and Rhythm: Normal rate and regular rhythm.  Pulmonary:     Effort: Pulmonary effort is normal.     Breath sounds: No rhonchi or rales.  Abdominal:     General: Abdomen is flat. Bowel sounds are normal. There is no distension.     Palpations: Abdomen is soft.     Tenderness: There is no abdominal tenderness.  Musculoskeletal:        General: Normal range of motion.     Cervical back: Normal range of motion and neck supple.     Right lower leg: No edema.     Left lower leg: No edema.  Skin:    General: Skin is warm and dry.     Capillary Refill: Capillary refill takes less than 2 seconds.  Neurological:     General: No focal deficit present.     Mental Status: She is alert and oriented to person, place, and time.  Psychiatric:        Mood and Affect: Mood normal.  Behavior: Behavior normal.        08/01/2024   10:59 AM 06/18/2024    9:14 AM 01/26/2024    8:42 AM  Depression screen PHQ 2/9  Decreased Interest 0 1 0  Down, Depressed, Hopeless 2 2 1   PHQ - 2 Score 2 3 1   Altered sleeping 2 3   Tired, decreased energy 1 2   Change in appetite 2 3   Feeling bad or failure about yourself  2 2   Trouble concentrating 0 0   Moving slowly or fidgety/restless 0 0   Suicidal thoughts 0 0   PHQ-9 Score 9 13   Difficult doing work/chores Not difficult at all Somewhat difficult        08/01/2024   10:59 AM 06/18/2024    9:15 AM 01/26/2024    8:43 AM 07/25/2023    3:33 PM  GAD 7 : Generalized Anxiety Score  Nervous, Anxious, on Edge 2 3 2 2   Control/stop worrying 1 2 1 1   Worry too much - different things 0 1 1 1   Trouble relaxing 2 1 1  0  Restless 0 1 1 0  Easily  annoyed or irritable 0 0 3 0  Afraid - awful might happen 1 1 1 1   Total GAD 7 Score 6 9 10 5   Anxiety Difficulty Not difficult at all Not difficult at all Not difficult at all Not difficult at all    No results found for any visits on 09/03/24.     The 10-year ASCVD risk score (Arnett DK, et al., 2019) is: 2.7%    Assessment & Plan:  Obesity, morbid [facility]) Assessment & Plan: Her sister is on tirzepatide  and has been doing well. Is interested in trying this however is concerned due to sever abdominal pain on compounded semaglutide that lasted about and hour before resolved. Tried to lower dose but had similar episode of pain and discontinued this. Has had tried phentermine , naltrexone, and Wellbutrin .  Had initial efficacy with phentermine  but regained the waited after discontinuing, no efficacy with second course or other medications.   Her weight ranges from 220 to 230 since early 2024. Has considerable food cravings and food noise with dietary modification. Has been eating a low cab diet and recently started exercising on indoor bike for 10 minutes every other night, is gradually increasing amount of physical activity as tolerated, goal to get to 150 minutes/week. 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 Would like to trial tirzepatide . Discussed this may have more tolerable side effect profile and perhaps stay at lower dose to avoid  side effects. Her insurance does not cover weight loss medications outside of diabetes. She would like to try self pay option through [employer]. -Start tirzepatide  2.5 mg weekly, first month coupon given, she will call and stop medication if having severe abdominal pain again    Other orders -     Tirzepatide -Weight Management; Inject 2.5 mg into the skin once a week.  Dispense: 2 mL; Refill: 1     No follow-ups on file.    Harlene Saddler, MD

## 2024-09-03 NOTE — Telephone Encounter (Signed)
 The appointment has been scheduled as an office visit for this afternoon.

## 2024-09-03 NOTE — Telephone Encounter (Signed)
 Please review patient's message:

## 2024-09-03 NOTE — Telephone Encounter (Signed)
 Please review

## 2024-09-05 ENCOUNTER — Encounter: Payer: Self-pay | Admitting: Student

## 2024-09-06 ENCOUNTER — Other Ambulatory Visit: Payer: Self-pay | Admitting: Student

## 2024-09-06 ENCOUNTER — Other Ambulatory Visit: Payer: Self-pay | Admitting: Internal Medicine

## 2024-09-06 DIAGNOSIS — I10 Essential (primary) hypertension: Secondary | ICD-10-CM

## 2024-09-06 MED ORDER — LISINOPRIL 10 MG PO TABS: ORAL_TABLET | ORAL | 1 refills | Status: AC

## 2024-09-06 NOTE — Telephone Encounter (Signed)
 Duplicate request,too soon for refill.  Requested Prescriptions  Pending Prescriptions Disp Refills   levothyroxine  (SYNTHROID ) 112 MCG tablet [Pharmacy Med Name: LEVOTHYROXINE  0.112MG  ( ) TABS] 90 tablet 1    Sig: TAKE 1 TABLET(112 MCG) BY MOUTH DAILY BEFORE BREAKFAST     Endocrinology:  Hypothyroid Agents Passed - 09/06/2024  4:25 PM      Passed - TSH in normal range and within 360 days    TSH  Date Value Ref Range Status  08/01/2024 4.240 0.450 - 4.500 uIU/mL Final         Passed - Valid encounter within last 12 months    Recent Outpatient Visits           3 days ago Obesity, morbid (HCC)   [place] [facility], MD   1 week ago Essential hypertension   [place] [facility] at [facility], MD   1 month ago Generalized anxiety disorder   [facility] at [facility], MD   2 months ago Generalized anxiety disorder   [facility] at [facility], MD   7 months ago Annual physical exam   [facility] at [facility] Leita DEL, MD

## 2024-09-22 ENCOUNTER — Other Ambulatory Visit: Payer: Self-pay | Admitting: Internal Medicine

## 2024-09-22 DIAGNOSIS — K219 Gastro-esophageal reflux disease without esophagitis: Secondary | ICD-10-CM

## 2024-09-24 NOTE — Telephone Encounter (Signed)
 Requested Prescriptions  Pending Prescriptions Disp Refills   omeprazole  (PRILOSEC) 20 MG capsule [Pharmacy Med Name: OMEPRAZOLE  20MG  CAPSULES] 90 capsule 0    Sig: TAKE 1 CAPSULE BY MOUTH DAILY     Gastroenterology: Proton Pump Inhibitors Passed - 09/24/2024  2:00 PM      Passed - Valid encounter within last 12 months    Recent Outpatient Visits           3 weeks ago Obesity, morbid (HCC)   [place] [facility] at [facility], MD   4 weeks ago Essential hypertension    [facility] at [facility], MD   1 month ago Generalized anxiety disorder   [facility] at [facility], [place]   3 months ago Generalized anxiety disorder   [facility] at [facility], [place]   8 months ago Annual physical exam   [facility] at [facility], Leita DEL, MD

## 2024-09-25 ENCOUNTER — Other Ambulatory Visit: Payer: Self-pay

## 2024-09-25 ENCOUNTER — Encounter: Payer: Self-pay | Admitting: Student

## 2024-09-25 DIAGNOSIS — K5901 Slow transit constipation: Secondary | ICD-10-CM

## 2024-09-25 MED ORDER — LINACLOTIDE 290 MCG PO CAPS: 290.0000 ug | ORAL_CAPSULE | Freq: Every day | ORAL | 1 refills | Status: AC

## 2024-10-07 ENCOUNTER — Encounter: Payer: Self-pay | Admitting: Student

## 2024-10-07 ENCOUNTER — Ambulatory Visit: Admitting: Student

## 2024-10-07 ENCOUNTER — Other Ambulatory Visit: Payer: Self-pay | Admitting: Student

## 2024-10-07 DIAGNOSIS — F411 Generalized anxiety disorder: Secondary | ICD-10-CM

## 2024-10-07 DIAGNOSIS — I10 Essential (primary) hypertension: Secondary | ICD-10-CM | POA: Diagnosis not present

## 2024-10-07 MED ORDER — HYDROCHLOROTHIAZIDE 12.5 MG PO CAPS: 12.5000 mg | ORAL_CAPSULE | Freq: Every day | ORAL | 1 refills | Status: DC

## 2024-10-07 MED ORDER — TIRZEPATIDE-WEIGHT MANAGEMENT 5 MG/0.5ML ~~LOC~~ SOLN: 5.0000 mg | SUBCUTANEOUS | 2 refills | Status: DC

## 2024-10-07 MED ORDER — HYDROCHLOROTHIAZIDE 12.5 MG PO CAPS: 12.5000 mg | ORAL_CAPSULE | Freq: Every day | ORAL | 1 refills | Status: AC

## 2024-10-07 MED ORDER — TIRZEPATIDE-WEIGHT MANAGEMENT 5 MG/0.5ML ~~LOC~~ SOLN: 5.0000 mg | SUBCUTANEOUS | 2 refills | Status: AC

## 2024-10-07 NOTE — Progress Notes (Unsigned)
 Established Patient Office Visit  Subjective   Patient ID: Cindy Pena, female    DOB: 01/21/1971  Age: 53 y.o. MRN: [id number]  Chief Complaint  Patient presents with   Hypertension    Tawni Glimpse with medical hx listed below presents today for follow up of hypertension. Please refer to problem based charting for further details and assessment and plan of current problem and chronic medical conditions.  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   Generalized anxiety disorder 07/09/2021   Hepatic steatosis 02/20/2019   Mild hyperlipidemia 02/02/2019   Hypothyroidism due to acquired atrophy of thyroid 02/01/2019   Essential hypertension 05/21/2018   Hand eczema 10/20/2017   Obesity, morbid (HCC) 04/19/2016   Gastroesophageal reflux disease without esophagitis 07/08/2015   PCOS (polycystic ovarian syndrome) 07/08/2015      ROS Refer to HPI    Objective:     Outpatient Encounter Medications as of 10/07/2024  Medication Sig   clonazePAM  (KLONOPIN ) 0.5 MG tablet TAKE 1 TABLET(0.5 MG) BY MOUTH TWICE DAILY AS NEEDED FOR ANXIETY   docusate sodium (COLACE) 100 MG capsule Take 100 mg by mouth 2 (two) times daily.   levothyroxine  (SYNTHROID ) 112 MCG tablet Take 1 tablet (112 mcg total) by mouth daily before breakfast.   linaclotide  (LINZESS ) 290 MCG CAPS capsule Take 1 capsule (290 mcg total) by mouth daily before breakfast.   lisinopril  (ZESTRIL ) 10 MG tablet TAKE 1 TABLET(10 MG) BY MOUTH DAILY   metFORMIN  (GLUCOPHAGE ) 500 MG tablet Take 1 tablet (500 mg total) by mouth 2 (two) times daily with a meal. TAKE 1 TABLET(500 MG) BY MOUTH DAILY WITH BREAKFAST   Multiple Vitamins-Minerals (ONE-A-DAY MENOPAUSE FORMULA PO) Take by mouth daily.   omeprazole  (PRILOSEC) 20 MG capsule TAKE 1 CAPSULE BY MOUTH DAILY   [DISCONTINUED]  hydrochlorothiazide  (MICROZIDE ) 12.5 MG capsule Take 1 capsule (12.5 mg total) by mouth daily.   [DISCONTINUED] tirzepatide  (ZEPBOUND ) 2.5 MG/0.5ML injection vial Inject 2.5 mg into the skin once a week.   [DISCONTINUED] tirzepatide  5 MG/0.5ML injection vial Inject 5 mg into the skin once a week.   hydrochlorothiazide  (MICROZIDE ) 12.5 MG capsule Take 1 capsule (12.5 mg total) by mouth daily.   tirzepatide  5 MG/0.5ML injection vial Inject 5 mg into the skin once a week.   [DISCONTINUED] cimetidine (TAGAMET) 200 MG tablet Take 200 mg by mouth 2 (two) times daily.   [DISCONTINUED] hydrochlorothiazide  (MICROZIDE ) 12.5 MG capsule Take 1 capsule (12.5 mg total) by mouth daily.   [DISCONTINUED] metoprolol  tartrate (LOPRESSOR ) 25 MG tablet Take 0.5 tablets (12.5 mg total) by mouth daily as needed.   [DISCONTINUED] tirzepatide  5 MG/0.5ML injection vial Inject 5 mg into the skin once a week.   No facility-administered encounter medications on file as of 10/07/2024.    BP 116/72   Pulse 97   Ht 5' 3 (1.6 m)   Wt 228 lb (103.4 kg)   LMP  (LMP Unknown)   SpO2 97%   BMI 40.39 kg/m  BP Readings from Last 3 Encounters:  10/07/24 116/72  09/03/24 118/76  08/27/24 138/74    Physical Exam Constitutional:      Appearance: Normal appearance.  HENT:     Head: Normocephalic and atraumatic.  Eyes:     Extraocular Movements: Extraocular movements intact.     Pupils: Pupils are equal, round, and reactive to light.  Cardiovascular:  Rate and Rhythm: Normal rate and regular rhythm.  Pulmonary:     Effort: Pulmonary effort is normal.     Breath sounds: No rhonchi or rales.  Abdominal:     General: Abdomen is flat. Bowel sounds are normal. There is no distension.     Palpations: Abdomen is soft.     Tenderness: There is no abdominal tenderness.  Musculoskeletal:        General: Normal range of motion.     Right lower leg: No edema.     Left lower leg: No edema.  Skin:    General: Skin is warm  and dry.     Capillary Refill: Capillary refill takes less than 2 seconds.  Neurological:     General: No focal deficit present.     Mental Status: She is alert and oriented to person, place, and time.  Psychiatric:        Mood and Affect: Mood normal.        Behavior: Behavior normal.        10/07/2024    4:46 PM 08/01/2024   10:59 AM 06/18/2024    9:14 AM  Depression screen PHQ 2/9  Decreased Interest 1 0 1  Down, Depressed, Hopeless 1 2 2   PHQ - 2 Score 2 2 3   Altered sleeping 3 2 3   Tired, decreased energy 2 1 2   Change in appetite 0 2 3  Feeling bad or failure about yourself  2 2 2   Trouble concentrating 0 0 0  Moving slowly or fidgety/restless 0 0 0  Suicidal thoughts 0 0 0  PHQ-9 Score 9 9 13   Difficult doing work/chores Not difficult at all Not difficult at all Somewhat difficult       10/07/2024    4:46 PM 08/01/2024   10:59 AM 06/18/2024    9:15 AM 01/26/2024    8:43 AM  GAD 7 : Generalized Anxiety Score  Nervous, Anxious, on Edge 1 2 3 2   Control/stop worrying 1 1 2 1   Worry too much - different things 1 0 1 1  Trouble relaxing 1 2 1 1   Restless 0 0 1 1  Easily annoyed or irritable 0 0 0 3  Afraid - awful might happen 1 1 1 1   Total GAD 7 Score 5 6 9 10   Anxiety Difficulty Not difficult at all Not difficult at all Not difficult at all Not difficult at all    No results found for any visits on 10/07/24.    The 10-year ASCVD risk score (Arnett DK, et al., 2019) is: 2.6%    Assessment & Plan:  Obesity, morbid (HCC) Assessment & Plan: Biking 4-5x/week for10 minutes and gradually increasing. Eating more protein lately. Tolerating zepbound  2.5 mg weekly, Weight without significant change but feels are looser. Increase zepbound  to 5 mg weekly.    Generalized anxiety disorder Assessment & Plan: Has not required klonopin  lately and feels anxiety is much improved. Continue to monitor.    Essential hypertension Assessment & Plan: Normotensive today.  Refilled hydrochlorothiazide  12.5 mg daily. Continue lisinopril .    Other orders -     hydroCHLOROthiazide ; Take 1 capsule (12.5 mg total) by mouth daily.  Dispense: 90 capsule; Refill: 1 -     Tirzepatide -Weight Management; Inject 5 mg into the skin once a week.  Dispense: 5 mL; Refill: 2     Return in about 4 months (around 02/07/2025).    Harlene Saddler, MD

## 2024-10-07 NOTE — Assessment & Plan Note (Signed)
 Has not required klonopin  lately and feels anxiety is much improved. Continue to monitor.

## 2024-10-07 NOTE — Assessment & Plan Note (Signed)
 Biking 4-5x/week for10 minutes and gradually increasing. Eating more protein lately. Tolerating zepbound  2.5 mg weekly, Weight without significant change but feels are looser. Increase zepbound  to 5 mg weekly.

## 2024-10-08 ENCOUNTER — Telehealth: Payer: Self-pay | Admitting: Pharmacy Technician

## 2024-10-08 ENCOUNTER — Other Ambulatory Visit (HOSPITAL_COMMUNITY): Payer: Self-pay

## 2024-10-08 ENCOUNTER — Telehealth: Payer: Self-pay

## 2024-10-08 NOTE — Telephone Encounter (Signed)
 Sorry Cindy Pena, I should of reviewed the chart before hand, Rx sent to [facility] direct. No PA needed.

## 2024-10-08 NOTE — Telephone Encounter (Signed)
 Cindy Pena is approved exclusively as an adjunct to diet and exercise to improve glycemic control in adults with type 2 diabetes mellitus. A review of patient's medical chart reveals no documented diagnosis of type 2 diabetes or an A1C indicative of diabetes. Therefore, they do not currently meet the criteria for prior authorization of this medication. If clinically appropriate, alternative  options such as Saxenda, Zepbound, or Wegovy  may be considered for this patient.

## 2024-10-08 NOTE — Telephone Encounter (Signed)
 Mounjaro  5 mg/0.5ML AUTO-INJECTORS   KEY: [id number]

## 2024-10-08 NOTE — Telephone Encounter (Signed)
 PA request has been Received. New Encounter has been or will be created for follow up. For additional info see Pharmacy Prior Auth telephone encounter from 10/08/24.

## 2024-10-09 NOTE — Telephone Encounter (Signed)
 No problem, have a great day!

## 2024-10-09 NOTE — Telephone Encounter (Signed)
 Duplicate request, refilled 10/07/24.  Requested Prescriptions  Pending Prescriptions Disp Refills   hydrochlorothiazide  (MICROZIDE ) 12.5 MG capsule [Pharmacy Med Name: HYDROCHLOROTHIAZIDE  12.5MG  CAPSULES] 90 capsule 1    Sig: TAKE 1 CAPSULE(12.5 MG) BY MOUTH DAILY     Cardiovascular: Diuretics - Thiazide Passed - 10/09/2024 11:20 AM      Passed - Cr in normal range and within 180 days    Creatinine, Ser  Date Value Ref Range Status  08/27/2024 0.83 0.57 - 1.00 mg/dL Final         Passed - K in normal range and within 180 days    Potassium  Date Value Ref Range Status  08/27/2024 4.7 3.5 - 5.2 mmol/L Final         Passed - Na in normal range and within 180 days    Sodium  Date Value Ref Range Status  08/27/2024 140 134 - 144 mmol/L Final         Passed - Last BP in normal range    BP Readings from Last 1 Encounters:  10/07/24 116/72         Passed - Valid encounter within last 6 months    Recent Outpatient Visits           2 days ago Obesity, morbid (HCC)   [place] [facility], [place]   1 month ago Obesity, morbid [facility])   [place] [facility] at [facility], MD   1 month ago Essential hypertension   [place] [facility] at [facility], MD   2 months ago Generalized anxiety disorder   [facility] at [facility], MD   3 months ago Generalized anxiety disorder   [facility] at [facility], [place]

## 2024-10-10 NOTE — Assessment & Plan Note (Signed)
 Normotensive today. Refilled hydrochlorothiazide  12.5 mg daily. Continue lisinopril .

## 2024-10-15 ENCOUNTER — Other Ambulatory Visit: Payer: Self-pay | Admitting: Student

## 2024-10-16 NOTE — Telephone Encounter (Signed)
 Discontinued 10/07/24.  Requested Prescriptions  Pending Prescriptions Disp Refills   ZEPBOUND  2.5 MG/0.5ML injection vial [Pharmacy Med Name: ZEPBOUND  2.5 MG/0.5ML SUBCUTANEOUS SOLUTION] 2 mL 1    Sig: INJECT 0.5 ML (2.5 MG) UNDER THE SKIN ONCE WEEKLY (0.5ML= 50 UNITS)     Off-Protocol Failed - 10/16/2024  4:06 PM      Failed - Medication not assigned to a protocol, review manually.      Passed - Valid encounter within last 12 months    Recent Outpatient Visits           1 week ago Obesity, morbid [facility])   [place] [facility] at [facility], [place]   1 month ago Obesity, morbid [facility])   [place] [facility] at [facility], MD   1 month ago Essential hypertension   [place] [facility] at [facility], MD   2 months ago Generalized anxiety disorder   [facility] at [facility], MD   4 months ago Generalized anxiety disorder   [facility] at [facility], [place]

## 2024-10-22 ENCOUNTER — Encounter

## 2024-10-30 ENCOUNTER — Encounter: Payer: Self-pay | Admitting: Student

## 2024-10-30 ENCOUNTER — Other Ambulatory Visit: Payer: Self-pay | Admitting: Physician Assistant

## 2024-10-30 DIAGNOSIS — F411 Generalized anxiety disorder: Secondary | ICD-10-CM

## 2024-10-30 MED ORDER — CLONAZEPAM 0.5 MG PO TABS: 0.5000 mg | ORAL_TABLET | Freq: Two times a day (BID) | ORAL | 0 refills | Status: AC | PRN

## 2024-10-30 NOTE — Telephone Encounter (Signed)
 Would you be able to assist with this request, since Dr. Lemon is out of the office at the moment.

## 2024-12-17 ENCOUNTER — Other Ambulatory Visit: Payer: Self-pay | Admitting: Student

## 2024-12-17 ENCOUNTER — Encounter: Payer: Self-pay | Admitting: Student

## 2024-12-17 MED ORDER — ONDANSETRON 4 MG PO TBDP: 4.0000 mg | ORAL_TABLET | Freq: Three times a day (TID) | ORAL | 0 refills | Status: AC | PRN

## 2024-12-17 NOTE — Telephone Encounter (Signed)
 Please review.  KP

## 2024-12-17 NOTE — Telephone Encounter (Signed)
 Called patient.  Patient took usual dose of Zepbound  5 mg on Friday.  Saturday started having nausea, bloating, and vomiting.  Since then symptoms have improved but continues to have some nausea and diminished appetite.  Symptoms could be related to foodborne illness versus reaction to Zepbound .  She has been on Zepbound  since the end of October without significant side effects until this week.  She would like to stay on Zepbound  if possible as she is having good weight loss effect with this.  Discussed delaying her Zepbound  dose until the weekend if not feeling well still.  If she persistently has symptoms she will let me know and we could titrate Zepbound  down to 2.5 mg weekly.  Sent in Zofran  for persistent symptoms of persistent nausea.

## 2024-12-18 ENCOUNTER — Other Ambulatory Visit: Payer: Self-pay | Admitting: Student

## 2024-12-18 DIAGNOSIS — Z1231 Encounter for screening mammogram for malignant neoplasm of breast: Secondary | ICD-10-CM

## 2024-12-22 ENCOUNTER — Other Ambulatory Visit: Payer: Self-pay | Admitting: Student

## 2024-12-22 DIAGNOSIS — E282 Polycystic ovarian syndrome: Secondary | ICD-10-CM

## 2024-12-22 DIAGNOSIS — K219 Gastro-esophageal reflux disease without esophagitis: Secondary | ICD-10-CM

## 2024-12-23 MED ORDER — OMEPRAZOLE 20 MG PO CPDR: 20.0000 mg | DELAYED_RELEASE_CAPSULE | Freq: Every day | ORAL | 0 refills | Status: AC

## 2025-01-24 ENCOUNTER — Encounter

## 2025-02-07 ENCOUNTER — Encounter

## 2025-02-07 ENCOUNTER — Encounter: Admitting: Student

## 2025-02-26 ENCOUNTER — Encounter: Admitting: Student
